# Patient Record
Sex: Female | Born: 1935 | Race: White | Hispanic: No | Smoking: Never smoker
Health system: Southern US, Community
[De-identification: ages and names within clinical notes are randomized; demographics above are authoritative.]

## PROBLEM LIST (undated history)

## (undated) DIAGNOSIS — I10 Essential (primary) hypertension: Secondary | ICD-10-CM

## (undated) DIAGNOSIS — K219 Gastro-esophageal reflux disease without esophagitis: Secondary | ICD-10-CM

## (undated) DIAGNOSIS — E119 Type 2 diabetes mellitus without complications: Secondary | ICD-10-CM

## (undated) HISTORY — PX: TONSILLECTOMY: SUR1361

## (undated) HISTORY — PX: UMBILICAL HERNIA REPAIR: SHX196

## (undated) HISTORY — PX: EYE SURGERY: SHX253

---

## 2009-06-21 LAB — HM COLONOSCOPY: HM Colonoscopy: NORMAL

## 2011-06-28 DIAGNOSIS — E119 Type 2 diabetes mellitus without complications: Secondary | ICD-10-CM | POA: Diagnosis not present

## 2011-06-28 DIAGNOSIS — Z0000 Encounter for general adult medical examination without abnormal findings: Secondary | ICD-10-CM | POA: Diagnosis not present

## 2011-06-28 DIAGNOSIS — I10 Essential (primary) hypertension: Secondary | ICD-10-CM | POA: Diagnosis not present

## 2011-06-28 DIAGNOSIS — Z23 Encounter for immunization: Secondary | ICD-10-CM | POA: Diagnosis not present

## 2011-06-28 DIAGNOSIS — E782 Mixed hyperlipidemia: Secondary | ICD-10-CM | POA: Diagnosis not present

## 2011-06-28 DIAGNOSIS — E559 Vitamin D deficiency, unspecified: Secondary | ICD-10-CM | POA: Diagnosis not present

## 2011-08-04 DIAGNOSIS — L719 Rosacea, unspecified: Secondary | ICD-10-CM | POA: Diagnosis not present

## 2011-08-04 DIAGNOSIS — Z85828 Personal history of other malignant neoplasm of skin: Secondary | ICD-10-CM | POA: Diagnosis not present

## 2011-08-04 DIAGNOSIS — L821 Other seborrheic keratosis: Secondary | ICD-10-CM | POA: Diagnosis not present

## 2011-08-04 DIAGNOSIS — L259 Unspecified contact dermatitis, unspecified cause: Secondary | ICD-10-CM | POA: Diagnosis not present

## 2011-09-01 DIAGNOSIS — D485 Neoplasm of uncertain behavior of skin: Secondary | ICD-10-CM | POA: Diagnosis not present

## 2011-09-01 DIAGNOSIS — L821 Other seborrheic keratosis: Secondary | ICD-10-CM | POA: Diagnosis not present

## 2011-09-01 DIAGNOSIS — Z711 Person with feared health complaint in whom no diagnosis is made: Secondary | ICD-10-CM | POA: Diagnosis not present

## 2011-09-05 DIAGNOSIS — M79609 Pain in unspecified limb: Secondary | ICD-10-CM | POA: Diagnosis not present

## 2012-01-03 DIAGNOSIS — E119 Type 2 diabetes mellitus without complications: Secondary | ICD-10-CM | POA: Diagnosis not present

## 2012-01-03 DIAGNOSIS — I10 Essential (primary) hypertension: Secondary | ICD-10-CM | POA: Diagnosis not present

## 2012-01-03 DIAGNOSIS — E782 Mixed hyperlipidemia: Secondary | ICD-10-CM | POA: Diagnosis not present

## 2012-02-17 DIAGNOSIS — L570 Actinic keratosis: Secondary | ICD-10-CM | POA: Diagnosis not present

## 2012-02-17 DIAGNOSIS — L708 Other acne: Secondary | ICD-10-CM | POA: Diagnosis not present

## 2012-02-17 DIAGNOSIS — L821 Other seborrheic keratosis: Secondary | ICD-10-CM | POA: Diagnosis not present

## 2012-02-17 DIAGNOSIS — L820 Inflamed seborrheic keratosis: Secondary | ICD-10-CM | POA: Diagnosis not present

## 2012-02-17 DIAGNOSIS — L719 Rosacea, unspecified: Secondary | ICD-10-CM | POA: Diagnosis not present

## 2012-02-17 DIAGNOSIS — Z85828 Personal history of other malignant neoplasm of skin: Secondary | ICD-10-CM | POA: Diagnosis not present

## 2012-05-04 DIAGNOSIS — Z23 Encounter for immunization: Secondary | ICD-10-CM | POA: Diagnosis not present

## 2012-06-29 DIAGNOSIS — E119 Type 2 diabetes mellitus without complications: Secondary | ICD-10-CM | POA: Diagnosis not present

## 2012-07-24 DIAGNOSIS — I10 Essential (primary) hypertension: Secondary | ICD-10-CM | POA: Diagnosis not present

## 2012-07-24 DIAGNOSIS — E119 Type 2 diabetes mellitus without complications: Secondary | ICD-10-CM | POA: Diagnosis not present

## 2012-07-24 DIAGNOSIS — E782 Mixed hyperlipidemia: Secondary | ICD-10-CM | POA: Diagnosis not present

## 2012-07-24 DIAGNOSIS — Z1389 Encounter for screening for other disorder: Secondary | ICD-10-CM | POA: Insufficient documentation

## 2012-07-24 DIAGNOSIS — E559 Vitamin D deficiency, unspecified: Secondary | ICD-10-CM | POA: Diagnosis not present

## 2012-07-24 DIAGNOSIS — Z0000 Encounter for general adult medical examination without abnormal findings: Secondary | ICD-10-CM | POA: Diagnosis not present

## 2013-01-22 DIAGNOSIS — I10 Essential (primary) hypertension: Secondary | ICD-10-CM | POA: Diagnosis not present

## 2013-01-22 DIAGNOSIS — E559 Vitamin D deficiency, unspecified: Secondary | ICD-10-CM | POA: Diagnosis not present

## 2013-01-22 DIAGNOSIS — E119 Type 2 diabetes mellitus without complications: Secondary | ICD-10-CM | POA: Diagnosis not present

## 2013-01-22 DIAGNOSIS — E782 Mixed hyperlipidemia: Secondary | ICD-10-CM | POA: Diagnosis not present

## 2013-03-28 DIAGNOSIS — Z23 Encounter for immunization: Secondary | ICD-10-CM | POA: Diagnosis not present

## 2013-06-07 DIAGNOSIS — J988 Other specified respiratory disorders: Secondary | ICD-10-CM | POA: Diagnosis not present

## 2013-06-09 ENCOUNTER — Ambulatory Visit: Payer: Self-pay | Admitting: Physician Assistant

## 2013-06-09 DIAGNOSIS — E7800 Pure hypercholesterolemia, unspecified: Secondary | ICD-10-CM | POA: Diagnosis not present

## 2013-06-09 DIAGNOSIS — E119 Type 2 diabetes mellitus without complications: Secondary | ICD-10-CM | POA: Diagnosis not present

## 2013-06-09 DIAGNOSIS — J40 Bronchitis, not specified as acute or chronic: Secondary | ICD-10-CM | POA: Diagnosis not present

## 2013-06-09 DIAGNOSIS — I10 Essential (primary) hypertension: Secondary | ICD-10-CM | POA: Diagnosis not present

## 2013-06-09 DIAGNOSIS — Z79899 Other long term (current) drug therapy: Secondary | ICD-10-CM | POA: Diagnosis not present

## 2013-06-09 DIAGNOSIS — R509 Fever, unspecified: Secondary | ICD-10-CM | POA: Diagnosis not present

## 2013-06-09 DIAGNOSIS — R0602 Shortness of breath: Secondary | ICD-10-CM | POA: Diagnosis not present

## 2013-07-29 DIAGNOSIS — Z0000 Encounter for general adult medical examination without abnormal findings: Secondary | ICD-10-CM | POA: Diagnosis not present

## 2013-07-29 DIAGNOSIS — E119 Type 2 diabetes mellitus without complications: Secondary | ICD-10-CM | POA: Diagnosis not present

## 2013-07-29 DIAGNOSIS — E782 Mixed hyperlipidemia: Secondary | ICD-10-CM | POA: Diagnosis not present

## 2013-07-29 DIAGNOSIS — I10 Essential (primary) hypertension: Secondary | ICD-10-CM | POA: Diagnosis not present

## 2013-07-29 DIAGNOSIS — E559 Vitamin D deficiency, unspecified: Secondary | ICD-10-CM | POA: Diagnosis not present

## 2013-07-29 DIAGNOSIS — M1710 Unilateral primary osteoarthritis, unspecified knee: Secondary | ICD-10-CM | POA: Diagnosis not present

## 2013-07-29 DIAGNOSIS — Z79899 Other long term (current) drug therapy: Secondary | ICD-10-CM | POA: Diagnosis not present

## 2013-07-29 DIAGNOSIS — Z23 Encounter for immunization: Secondary | ICD-10-CM | POA: Diagnosis not present

## 2013-08-02 ENCOUNTER — Ambulatory Visit: Payer: Self-pay | Admitting: Physician Assistant

## 2013-08-02 DIAGNOSIS — I10 Essential (primary) hypertension: Secondary | ICD-10-CM | POA: Diagnosis not present

## 2013-08-02 DIAGNOSIS — J189 Pneumonia, unspecified organism: Secondary | ICD-10-CM | POA: Diagnosis not present

## 2013-08-02 DIAGNOSIS — Z79899 Other long term (current) drug therapy: Secondary | ICD-10-CM | POA: Diagnosis not present

## 2013-08-02 DIAGNOSIS — E119 Type 2 diabetes mellitus without complications: Secondary | ICD-10-CM | POA: Diagnosis not present

## 2013-08-02 DIAGNOSIS — R05 Cough: Secondary | ICD-10-CM | POA: Diagnosis not present

## 2013-08-02 DIAGNOSIS — E785 Hyperlipidemia, unspecified: Secondary | ICD-10-CM | POA: Diagnosis not present

## 2013-08-02 DIAGNOSIS — R059 Cough, unspecified: Secondary | ICD-10-CM | POA: Diagnosis not present

## 2013-08-02 DIAGNOSIS — R062 Wheezing: Secondary | ICD-10-CM | POA: Diagnosis not present

## 2013-08-02 LAB — RAPID INFLUENZA A&B ANTIGENS

## 2013-10-16 DIAGNOSIS — I10 Essential (primary) hypertension: Secondary | ICD-10-CM | POA: Diagnosis not present

## 2013-10-16 DIAGNOSIS — E559 Vitamin D deficiency, unspecified: Secondary | ICD-10-CM | POA: Diagnosis not present

## 2013-10-16 DIAGNOSIS — E785 Hyperlipidemia, unspecified: Secondary | ICD-10-CM | POA: Diagnosis not present

## 2013-10-16 DIAGNOSIS — E119 Type 2 diabetes mellitus without complications: Secondary | ICD-10-CM | POA: Diagnosis not present

## 2013-10-16 DIAGNOSIS — J309 Allergic rhinitis, unspecified: Secondary | ICD-10-CM | POA: Diagnosis not present

## 2013-12-03 DIAGNOSIS — H2510 Age-related nuclear cataract, unspecified eye: Secondary | ICD-10-CM | POA: Diagnosis not present

## 2013-12-19 DIAGNOSIS — H01009 Unspecified blepharitis unspecified eye, unspecified eyelid: Secondary | ICD-10-CM | POA: Diagnosis not present

## 2013-12-25 ENCOUNTER — Ambulatory Visit: Payer: Self-pay | Admitting: Ophthalmology

## 2013-12-25 DIAGNOSIS — Z01810 Encounter for preprocedural cardiovascular examination: Secondary | ICD-10-CM | POA: Diagnosis not present

## 2013-12-25 DIAGNOSIS — H2510 Age-related nuclear cataract, unspecified eye: Secondary | ICD-10-CM | POA: Diagnosis not present

## 2013-12-25 DIAGNOSIS — I10 Essential (primary) hypertension: Secondary | ICD-10-CM | POA: Diagnosis not present

## 2013-12-27 DIAGNOSIS — E1169 Type 2 diabetes mellitus with other specified complication: Secondary | ICD-10-CM | POA: Insufficient documentation

## 2013-12-27 DIAGNOSIS — E118 Type 2 diabetes mellitus with unspecified complications: Secondary | ICD-10-CM | POA: Insufficient documentation

## 2013-12-27 DIAGNOSIS — E119 Type 2 diabetes mellitus without complications: Secondary | ICD-10-CM | POA: Diagnosis not present

## 2013-12-27 DIAGNOSIS — I10 Essential (primary) hypertension: Secondary | ICD-10-CM | POA: Diagnosis not present

## 2013-12-27 DIAGNOSIS — E785 Hyperlipidemia, unspecified: Secondary | ICD-10-CM | POA: Diagnosis not present

## 2013-12-30 ENCOUNTER — Ambulatory Visit: Payer: Self-pay | Admitting: Ophthalmology

## 2013-12-30 DIAGNOSIS — Z79899 Other long term (current) drug therapy: Secondary | ICD-10-CM | POA: Diagnosis not present

## 2013-12-30 DIAGNOSIS — Z880 Allergy status to penicillin: Secondary | ICD-10-CM | POA: Diagnosis not present

## 2013-12-30 DIAGNOSIS — H25019 Cortical age-related cataract, unspecified eye: Secondary | ICD-10-CM | POA: Diagnosis not present

## 2013-12-30 DIAGNOSIS — E119 Type 2 diabetes mellitus without complications: Secondary | ICD-10-CM | POA: Diagnosis not present

## 2013-12-30 DIAGNOSIS — I10 Essential (primary) hypertension: Secondary | ICD-10-CM | POA: Diagnosis not present

## 2013-12-30 DIAGNOSIS — E7800 Pure hypercholesterolemia, unspecified: Secondary | ICD-10-CM | POA: Diagnosis not present

## 2013-12-30 DIAGNOSIS — H269 Unspecified cataract: Secondary | ICD-10-CM | POA: Diagnosis not present

## 2013-12-30 DIAGNOSIS — H2510 Age-related nuclear cataract, unspecified eye: Secondary | ICD-10-CM | POA: Diagnosis not present

## 2013-12-30 DIAGNOSIS — Z85828 Personal history of other malignant neoplasm of skin: Secondary | ICD-10-CM | POA: Diagnosis not present

## 2014-01-27 DIAGNOSIS — H2510 Age-related nuclear cataract, unspecified eye: Secondary | ICD-10-CM | POA: Diagnosis not present

## 2014-02-03 ENCOUNTER — Ambulatory Visit: Payer: Self-pay | Admitting: Ophthalmology

## 2014-02-03 DIAGNOSIS — Z85828 Personal history of other malignant neoplasm of skin: Secondary | ICD-10-CM | POA: Diagnosis not present

## 2014-02-03 DIAGNOSIS — E7800 Pure hypercholesterolemia, unspecified: Secondary | ICD-10-CM | POA: Diagnosis not present

## 2014-02-03 DIAGNOSIS — I10 Essential (primary) hypertension: Secondary | ICD-10-CM | POA: Diagnosis not present

## 2014-02-03 DIAGNOSIS — H269 Unspecified cataract: Secondary | ICD-10-CM | POA: Diagnosis not present

## 2014-02-03 DIAGNOSIS — E119 Type 2 diabetes mellitus without complications: Secondary | ICD-10-CM | POA: Diagnosis not present

## 2014-02-03 DIAGNOSIS — Z880 Allergy status to penicillin: Secondary | ICD-10-CM | POA: Diagnosis not present

## 2014-02-03 DIAGNOSIS — H2510 Age-related nuclear cataract, unspecified eye: Secondary | ICD-10-CM | POA: Diagnosis not present

## 2014-02-03 DIAGNOSIS — Z79899 Other long term (current) drug therapy: Secondary | ICD-10-CM | POA: Diagnosis not present

## 2014-02-13 DIAGNOSIS — E119 Type 2 diabetes mellitus without complications: Secondary | ICD-10-CM | POA: Diagnosis not present

## 2014-02-13 DIAGNOSIS — E785 Hyperlipidemia, unspecified: Secondary | ICD-10-CM | POA: Diagnosis not present

## 2014-02-13 DIAGNOSIS — E559 Vitamin D deficiency, unspecified: Secondary | ICD-10-CM | POA: Diagnosis not present

## 2014-02-13 DIAGNOSIS — I10 Essential (primary) hypertension: Secondary | ICD-10-CM | POA: Diagnosis not present

## 2014-02-18 LAB — HM MAMMOGRAPHY: HM Mammogram: NORMAL

## 2014-02-25 ENCOUNTER — Ambulatory Visit: Payer: Self-pay | Admitting: Internal Medicine

## 2014-02-25 DIAGNOSIS — Z1231 Encounter for screening mammogram for malignant neoplasm of breast: Secondary | ICD-10-CM | POA: Diagnosis not present

## 2014-04-01 DIAGNOSIS — Z23 Encounter for immunization: Secondary | ICD-10-CM | POA: Diagnosis not present

## 2014-07-21 HISTORY — PX: ESOPHAGOGASTRODUODENOSCOPY: SHX1529

## 2014-07-21 IMAGING — CR DG CHEST 2V
1 series · 2 of 2 positions shown · non-contrast
Comparison: None.

CLINICAL DATA: Fever, shortness of Breath.

EXAM:
CHEST  2 VIEW

[Series 1: pa · 0.17mm/px · 2 of 2 slices shown]
[im 1/2]
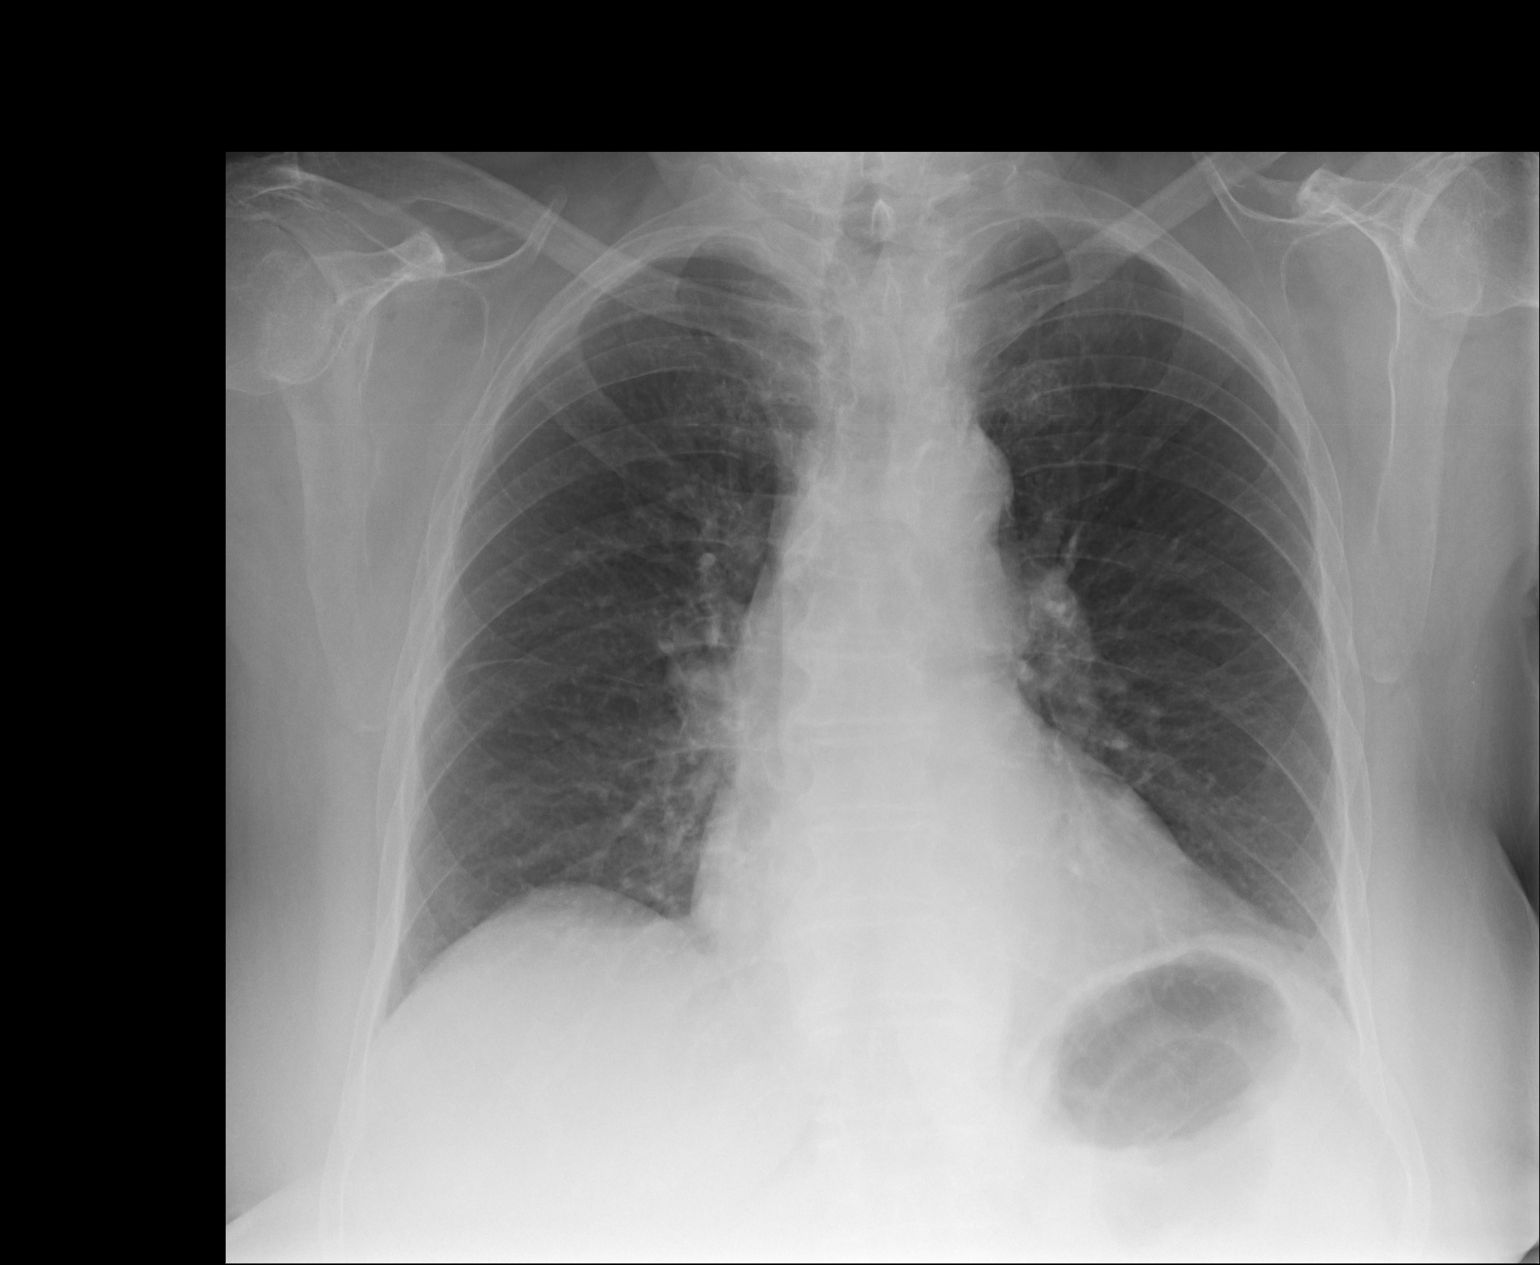
[im 2/2]
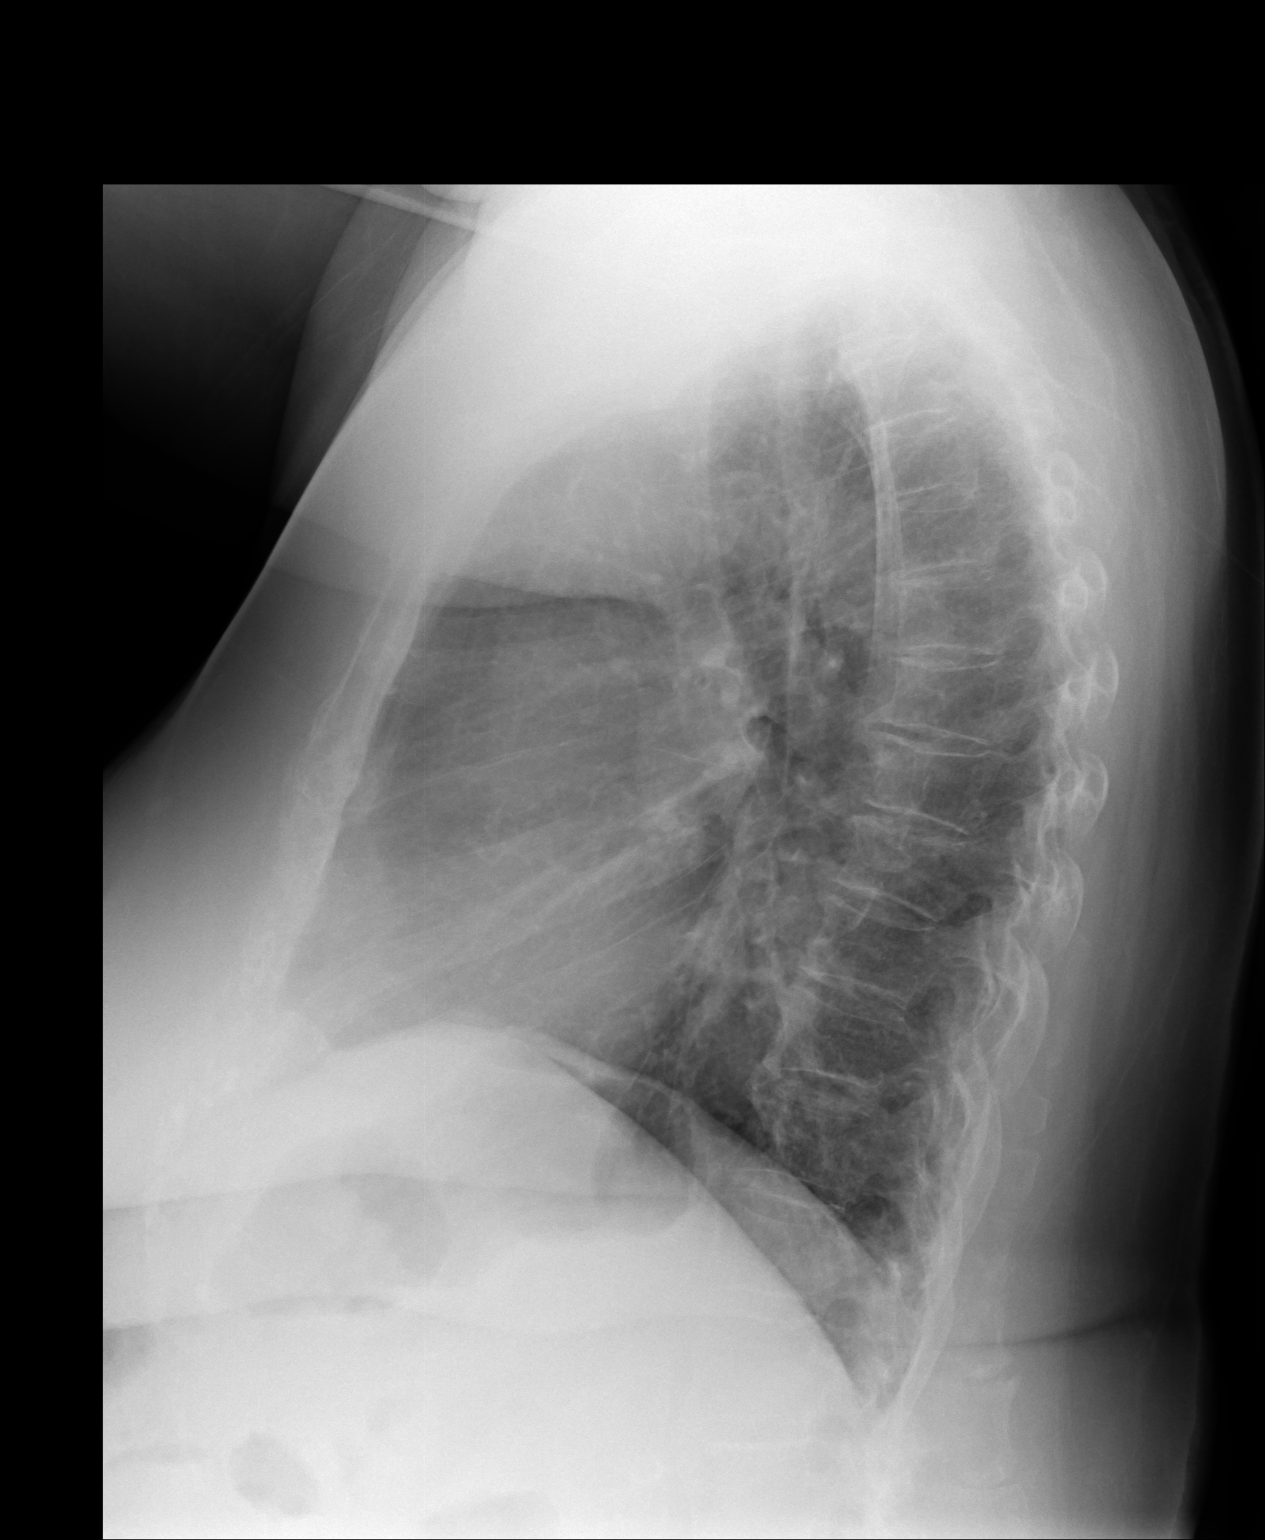

[2 of 2 positions shown; findings below may reference images not displayed]

FINDINGS: The heart size and mediastinal contours are within normal limits.
Both lungs are clear. The visualized skeletal structures are
unremarkable.
IMPRESSION: No active cardiopulmonary disease.

## 2014-08-08 ENCOUNTER — Emergency Department: Payer: Self-pay | Admitting: Emergency Medicine

## 2014-08-08 ENCOUNTER — Ambulatory Visit: Payer: Self-pay

## 2014-08-08 DIAGNOSIS — R1110 Vomiting, unspecified: Secondary | ICD-10-CM | POA: Diagnosis not present

## 2014-08-08 DIAGNOSIS — Z880 Allergy status to penicillin: Secondary | ICD-10-CM | POA: Diagnosis not present

## 2014-08-08 DIAGNOSIS — N2889 Other specified disorders of kidney and ureter: Secondary | ICD-10-CM | POA: Diagnosis not present

## 2014-08-08 DIAGNOSIS — E119 Type 2 diabetes mellitus without complications: Secondary | ICD-10-CM | POA: Diagnosis not present

## 2014-08-08 DIAGNOSIS — R197 Diarrhea, unspecified: Secondary | ICD-10-CM | POA: Diagnosis not present

## 2014-08-08 DIAGNOSIS — E785 Hyperlipidemia, unspecified: Secondary | ICD-10-CM | POA: Diagnosis not present

## 2014-08-08 DIAGNOSIS — R109 Unspecified abdominal pain: Secondary | ICD-10-CM | POA: Diagnosis not present

## 2014-08-08 DIAGNOSIS — K8050 Calculus of bile duct without cholangitis or cholecystitis without obstruction: Secondary | ICD-10-CM | POA: Diagnosis not present

## 2014-08-08 DIAGNOSIS — H6122 Impacted cerumen, left ear: Secondary | ICD-10-CM | POA: Diagnosis not present

## 2014-08-08 DIAGNOSIS — Z7982 Long term (current) use of aspirin: Secondary | ICD-10-CM | POA: Diagnosis not present

## 2014-08-08 DIAGNOSIS — Z79899 Other long term (current) drug therapy: Secondary | ICD-10-CM | POA: Diagnosis not present

## 2014-08-08 DIAGNOSIS — I10 Essential (primary) hypertension: Secondary | ICD-10-CM | POA: Diagnosis not present

## 2014-08-09 DIAGNOSIS — E119 Type 2 diabetes mellitus without complications: Secondary | ICD-10-CM | POA: Diagnosis not present

## 2014-08-09 DIAGNOSIS — K811 Chronic cholecystitis: Secondary | ICD-10-CM | POA: Diagnosis not present

## 2014-08-12 DIAGNOSIS — K921 Melena: Secondary | ICD-10-CM | POA: Diagnosis not present

## 2014-08-12 DIAGNOSIS — R109 Unspecified abdominal pain: Secondary | ICD-10-CM | POA: Diagnosis not present

## 2014-08-13 ENCOUNTER — Inpatient Hospital Stay: Payer: Self-pay | Admitting: Internal Medicine

## 2014-08-13 DIAGNOSIS — I469 Cardiac arrest, cause unspecified: Secondary | ICD-10-CM | POA: Diagnosis not present

## 2014-08-13 DIAGNOSIS — R197 Diarrhea, unspecified: Secondary | ICD-10-CM | POA: Diagnosis not present

## 2014-08-13 DIAGNOSIS — I700 Atherosclerosis of aorta: Secondary | ICD-10-CM | POA: Diagnosis present

## 2014-08-13 DIAGNOSIS — K921 Melena: Secondary | ICD-10-CM | POA: Diagnosis not present

## 2014-08-13 DIAGNOSIS — Z01810 Encounter for preprocedural cardiovascular examination: Secondary | ICD-10-CM | POA: Diagnosis not present

## 2014-08-13 DIAGNOSIS — I452 Bifascicular block: Secondary | ICD-10-CM | POA: Diagnosis not present

## 2014-08-13 DIAGNOSIS — G9340 Encephalopathy, unspecified: Secondary | ICD-10-CM | POA: Diagnosis not present

## 2014-08-13 DIAGNOSIS — E876 Hypokalemia: Secondary | ICD-10-CM | POA: Diagnosis not present

## 2014-08-13 DIAGNOSIS — K922 Gastrointestinal hemorrhage, unspecified: Secondary | ICD-10-CM | POA: Diagnosis not present

## 2014-08-13 DIAGNOSIS — Z8371 Family history of colonic polyps: Secondary | ICD-10-CM | POA: Diagnosis not present

## 2014-08-13 DIAGNOSIS — E860 Dehydration: Secondary | ICD-10-CM | POA: Diagnosis present

## 2014-08-13 DIAGNOSIS — I10 Essential (primary) hypertension: Secondary | ICD-10-CM | POA: Diagnosis present

## 2014-08-13 DIAGNOSIS — I083 Combined rheumatic disorders of mitral, aortic and tricuspid valves: Secondary | ICD-10-CM | POA: Diagnosis present

## 2014-08-13 DIAGNOSIS — E669 Obesity, unspecified: Secondary | ICD-10-CM | POA: Diagnosis present

## 2014-08-13 DIAGNOSIS — Z7982 Long term (current) use of aspirin: Secondary | ICD-10-CM | POA: Diagnosis not present

## 2014-08-13 DIAGNOSIS — K209 Esophagitis, unspecified: Secondary | ICD-10-CM | POA: Diagnosis not present

## 2014-08-13 DIAGNOSIS — R112 Nausea with vomiting, unspecified: Secondary | ICD-10-CM | POA: Diagnosis not present

## 2014-08-13 DIAGNOSIS — R9431 Abnormal electrocardiogram [ECG] [EKG]: Secondary | ICD-10-CM | POA: Diagnosis not present

## 2014-08-13 DIAGNOSIS — E785 Hyperlipidemia, unspecified: Secondary | ICD-10-CM | POA: Diagnosis present

## 2014-08-13 DIAGNOSIS — D649 Anemia, unspecified: Secondary | ICD-10-CM | POA: Diagnosis not present

## 2014-08-13 DIAGNOSIS — I491 Atrial premature depolarization: Secondary | ICD-10-CM | POA: Diagnosis present

## 2014-08-13 DIAGNOSIS — K259 Gastric ulcer, unspecified as acute or chronic, without hemorrhage or perforation: Secondary | ICD-10-CM | POA: Diagnosis not present

## 2014-08-13 DIAGNOSIS — Z86010 Personal history of colonic polyps: Secondary | ICD-10-CM | POA: Diagnosis not present

## 2014-08-13 DIAGNOSIS — K208 Other esophagitis: Secondary | ICD-10-CM | POA: Diagnosis not present

## 2014-08-13 DIAGNOSIS — J9601 Acute respiratory failure with hypoxia: Secondary | ICD-10-CM | POA: Diagnosis not present

## 2014-08-13 DIAGNOSIS — Z8249 Family history of ischemic heart disease and other diseases of the circulatory system: Secondary | ICD-10-CM | POA: Diagnosis not present

## 2014-08-13 DIAGNOSIS — E871 Hypo-osmolality and hyponatremia: Secondary | ICD-10-CM | POA: Diagnosis present

## 2014-08-13 DIAGNOSIS — Z800 Family history of malignant neoplasm of digestive organs: Secondary | ICD-10-CM | POA: Diagnosis not present

## 2014-08-13 DIAGNOSIS — D62 Acute posthemorrhagic anemia: Secondary | ICD-10-CM | POA: Diagnosis present

## 2014-08-13 DIAGNOSIS — K253 Acute gastric ulcer without hemorrhage or perforation: Secondary | ICD-10-CM | POA: Diagnosis present

## 2014-08-13 DIAGNOSIS — E119 Type 2 diabetes mellitus without complications: Secondary | ICD-10-CM | POA: Diagnosis not present

## 2014-08-13 DIAGNOSIS — B3781 Candidal esophagitis: Secondary | ICD-10-CM | POA: Diagnosis not present

## 2014-08-13 DIAGNOSIS — K311 Adult hypertrophic pyloric stenosis: Secondary | ICD-10-CM | POA: Diagnosis not present

## 2014-08-22 DIAGNOSIS — K922 Gastrointestinal hemorrhage, unspecified: Secondary | ICD-10-CM | POA: Diagnosis not present

## 2014-08-22 DIAGNOSIS — I10 Essential (primary) hypertension: Secondary | ICD-10-CM | POA: Diagnosis not present

## 2014-08-22 DIAGNOSIS — E119 Type 2 diabetes mellitus without complications: Secondary | ICD-10-CM | POA: Diagnosis not present

## 2014-08-22 DIAGNOSIS — J40 Bronchitis, not specified as acute or chronic: Secondary | ICD-10-CM | POA: Diagnosis not present

## 2014-08-22 DIAGNOSIS — I5030 Unspecified diastolic (congestive) heart failure: Secondary | ICD-10-CM | POA: Diagnosis not present

## 2014-08-22 DIAGNOSIS — B3781 Candidal esophagitis: Secondary | ICD-10-CM | POA: Diagnosis not present

## 2014-08-22 LAB — CBC AND DIFFERENTIAL
HEMATOCRIT: 28 % — AB (ref 36–46)
HEMOGLOBIN: 8.5 g/dL — AB (ref 12.0–16.0)
PLATELETS: 385 10*3/uL (ref 150–399)

## 2014-08-22 LAB — HEMOGLOBIN A1C: Hgb A1c MFr Bld: 6.9 % — AB (ref 4.0–6.0)

## 2014-08-26 DIAGNOSIS — K253 Acute gastric ulcer without hemorrhage or perforation: Secondary | ICD-10-CM | POA: Diagnosis not present

## 2014-08-26 DIAGNOSIS — K209 Esophagitis, unspecified: Secondary | ICD-10-CM | POA: Diagnosis not present

## 2014-08-26 DIAGNOSIS — K311 Adult hypertrophic pyloric stenosis: Secondary | ICD-10-CM | POA: Diagnosis not present

## 2014-08-29 ENCOUNTER — Ambulatory Visit: Payer: Self-pay | Admitting: Gastroenterology

## 2014-08-29 DIAGNOSIS — K260 Acute duodenal ulcer with hemorrhage: Secondary | ICD-10-CM | POA: Diagnosis not present

## 2014-08-29 DIAGNOSIS — E785 Hyperlipidemia, unspecified: Secondary | ICD-10-CM | POA: Diagnosis not present

## 2014-08-29 DIAGNOSIS — K253 Acute gastric ulcer without hemorrhage or perforation: Secondary | ICD-10-CM | POA: Diagnosis not present

## 2014-08-29 DIAGNOSIS — I10 Essential (primary) hypertension: Secondary | ICD-10-CM | POA: Diagnosis not present

## 2014-08-29 DIAGNOSIS — E559 Vitamin D deficiency, unspecified: Secondary | ICD-10-CM | POA: Diagnosis not present

## 2014-08-29 DIAGNOSIS — K259 Gastric ulcer, unspecified as acute or chronic, without hemorrhage or perforation: Secondary | ICD-10-CM | POA: Diagnosis not present

## 2014-08-29 DIAGNOSIS — K5710 Diverticulosis of small intestine without perforation or abscess without bleeding: Secondary | ICD-10-CM | POA: Diagnosis not present

## 2014-08-29 DIAGNOSIS — K208 Other esophagitis: Secondary | ICD-10-CM | POA: Diagnosis not present

## 2014-08-29 DIAGNOSIS — K209 Esophagitis, unspecified: Secondary | ICD-10-CM | POA: Diagnosis not present

## 2014-08-29 DIAGNOSIS — K311 Adult hypertrophic pyloric stenosis: Secondary | ICD-10-CM | POA: Diagnosis not present

## 2014-08-29 DIAGNOSIS — M1710 Unilateral primary osteoarthritis, unspecified knee: Secondary | ICD-10-CM | POA: Diagnosis not present

## 2014-08-29 DIAGNOSIS — E119 Type 2 diabetes mellitus without complications: Secondary | ICD-10-CM | POA: Diagnosis not present

## 2014-09-01 DIAGNOSIS — J9621 Acute and chronic respiratory failure with hypoxia: Secondary | ICD-10-CM | POA: Diagnosis not present

## 2014-09-01 DIAGNOSIS — Z8679 Personal history of other diseases of the circulatory system: Secondary | ICD-10-CM | POA: Diagnosis not present

## 2014-09-01 DIAGNOSIS — I10 Essential (primary) hypertension: Secondary | ICD-10-CM | POA: Diagnosis not present

## 2014-09-01 DIAGNOSIS — R002 Palpitations: Secondary | ICD-10-CM | POA: Diagnosis not present

## 2014-09-08 DIAGNOSIS — E119 Type 2 diabetes mellitus without complications: Secondary | ICD-10-CM | POA: Diagnosis not present

## 2014-09-08 DIAGNOSIS — I10 Essential (primary) hypertension: Secondary | ICD-10-CM | POA: Diagnosis not present

## 2014-09-08 DIAGNOSIS — B9681 Helicobacter pylori [H. pylori] as the cause of diseases classified elsewhere: Secondary | ICD-10-CM | POA: Diagnosis not present

## 2014-09-08 DIAGNOSIS — K279 Peptic ulcer, site unspecified, unspecified as acute or chronic, without hemorrhage or perforation: Secondary | ICD-10-CM | POA: Diagnosis not present

## 2014-09-08 DIAGNOSIS — Q396 Congenital diverticulum of esophagus: Secondary | ICD-10-CM | POA: Diagnosis not present

## 2014-09-08 DIAGNOSIS — K274 Chronic or unspecified peptic ulcer, site unspecified, with hemorrhage: Secondary | ICD-10-CM | POA: Diagnosis not present

## 2014-09-08 DIAGNOSIS — R251 Tremor, unspecified: Secondary | ICD-10-CM | POA: Diagnosis not present

## 2014-09-10 DIAGNOSIS — R002 Palpitations: Secondary | ICD-10-CM | POA: Diagnosis not present

## 2014-09-18 DIAGNOSIS — R002 Palpitations: Secondary | ICD-10-CM | POA: Diagnosis not present

## 2014-09-18 DIAGNOSIS — E782 Mixed hyperlipidemia: Secondary | ICD-10-CM | POA: Diagnosis not present

## 2014-09-18 DIAGNOSIS — R0789 Other chest pain: Secondary | ICD-10-CM | POA: Diagnosis not present

## 2014-09-18 DIAGNOSIS — I10 Essential (primary) hypertension: Secondary | ICD-10-CM | POA: Diagnosis not present

## 2014-09-24 DIAGNOSIS — D6489 Other specified anemias: Secondary | ICD-10-CM | POA: Diagnosis not present

## 2014-09-26 DIAGNOSIS — D509 Iron deficiency anemia, unspecified: Secondary | ICD-10-CM | POA: Diagnosis not present

## 2014-09-26 DIAGNOSIS — R0789 Other chest pain: Secondary | ICD-10-CM | POA: Diagnosis not present

## 2014-10-03 DIAGNOSIS — J9621 Acute and chronic respiratory failure with hypoxia: Secondary | ICD-10-CM | POA: Diagnosis not present

## 2014-10-08 DIAGNOSIS — E11329 Type 2 diabetes mellitus with mild nonproliferative diabetic retinopathy without macular edema: Secondary | ICD-10-CM | POA: Diagnosis not present

## 2014-10-11 NOTE — Op Note (Signed)
PATIENT NAME:  Penny Andrews, Penny Andrews MR#:  [id number] DATE OF BIRTH:  February 21, 1925  DATE OF PROCEDURE:  12/30/2013  PREOPERATIVE DIAGNOSIS: Cataract, right eye.   POSTOPERATIVE DIAGNOSIS: Cataract, right eye.   PROCEDURE PERFORMED: Extracapsular cataract extraction using phacoemulsification with placement Alcon [id number], 24.5-diopter posterior chamber lens, serial number [id number].    SURGEON:  Loura Back. Makaylin Carlo, MD  ASSISTANT:  None.  ANESTHESIA: 4% lidocaine and 0.75% Marcaine a 50-50 mixture with 10 units/mL of HyoMax added, given as a peribulbar.   ANESTHESIOLOGIST: Dr. Verl Dicker COMPLICATIONS: None.   ESTIMATED BLOOD LOSS: Less than 1 mL.   DESCRIPTION OF PROCEDURE:  The patient was brought to the operating room and given a peribulbar block.  The patient was then prepped and draped in the usual fashion.  The vertical rectus muscles were imbricated using 5-0 silk sutures.  These sutures were then clamped to the sterile drapes as bridle sutures.  A limbal peritomy was performed extending two clock hours and hemostasis was obtained with cautery.  A partial thickness scleral groove was made at the surgical limbus and dissected anteriorly in a lamellar dissection using an Alcon crescent knife.  The anterior chamber was entered superonasally with a Superblade and through the lamellar dissection with a 2.6 mm keratome.  DisCoVisc was used to replace the aqueous and a continuous tear capsulorrhexis was carried out.  Hydrodissection and hydrodelineation were carried out with balanced salt and a 27 gauge canula.  The nucleus was rotated to confirm the effectiveness of the hydrodissection.  Phacoemulsification was carried out using a divide-and-conquer technique.  Total ultrasound time was 1 minute and 34.4 seconds with an average power of 19.4 percent. CDE of 33.24. No suture was placed.   Irrigation/aspiration was used to remove the residual cortex.  DisCoVisc was used to inflate the capsule and the  internal incision was enlarged to 3 mm with the crescent knife.  The intraocular lens was folded and inserted into the capsular bag using the Alcon AcrySert delivery system. Irrigation/aspiration was used to remove the residual DisCoVisc.  Miostat was injected into the anterior chamber through the paracentesis track to inflate the anterior chamber and induce miosis. A tenth of a milliliter of cefuroxime was injected via the paracentesis track containing 1 mg of drug. The wound was checked for leaks and none were found. The conjunctiva was closed with cautery and the bridle sutures were removed.  Two drops of 0.3% Vigamox were placed on the eye.   An eye shield was placed on the eye.  The patient was discharged to the recovery room in good condition.   ____________________________ Loura Back Jyaire Koudelka, MD sad:lt D: 12/30/2013 12:47:00 ET T: 12/31/2013 00:13:12 ET JOB#: [id number]  cc: Remo Lipps A. Josefine Fuhr, MD, <Dictator> Martie Lee MD ELECTRONICALLY SIGNED 01/06/2014 12:32

## 2014-10-11 NOTE — Op Note (Signed)
PATIENT NAME:  Penny Andrews, Penny Andrews MR#:  [id number] DATE OF BIRTH:  13-Oct-1924  DATE OF PROCEDURE:  02/03/2014  PREOPERATIVE DIAGNOSIS:  Cataract, left eye.    POSTOPERATIVE DIAGNOSIS:  Cataract, left eye.  PROCEDURE PERFORMED:  Extracapsular cataract extraction using phacoemulsification with placement of an Alcon SN6CWS 24.5-diopter posterior chamber lens, serial #[id number].  SURGEON:  Loura Back. Akeyla Molden, MD  ASSISTANT:  None.  ANESTHESIA:  4% lidocaine and 0.75% Marcaine in a 50/50 mixture with 10 units/mL of Hylenex added, given as a peribulbar.   ANESTHESIOLOGIST: Dr. Marcello Moores.  COMPLICATIONS:  None.  ESTIMATED BLOOD LOSS:  Less than 1 ml.  DESCRIPTION OF PROCEDURE:  The patient was brought to the operating room and given a peribulbar block.  The patient was then prepped and draped in the usual fashion.  The vertical rectus muscles were imbricated using 5-0 silk sutures.  These sutures were then clamped to the sterile drapes as bridle sutures.  A limbal peritomy was performed extending two clock hours and hemostasis was obtained with cautery.  A partial thickness scleral groove was made at the surgical limbus and dissected anteriorly in a lamellar dissection using an Alcon crescent knife.  The anterior chamber was entered supero-temporally with a Superblade and through the lamellar dissection with a 2.6 mm keratome.  DisCoVisc was used to replace the aqueous and a continuous tear capsulorrhexis was carried out.  Hydrodissection and hydrodelineation were carried out with balanced salt and a 27 gauge canula.  The nucleus was rotated to confirm the effectiveness of the hydrodissection.  Phacoemulsification was carried out using a divide-and-conquer technique.  Total ultrasound time was 1 minute with an average power of 24.1 percent and CDE of 24.50.  Irrigation/aspiration was used to remove the residual cortex.  DisCoVisc was used to inflate the capsule and the internal incision was enlarged  to 3 mm with the crescent knife.  The intraocular lens was folded and inserted into the capsular bag using the AcrySert delivery system.  Irrigation/aspiration was used to remove the residual DisCoVisc.  Miostat was injected into the anterior chamber through the paracentesis track to inflate the anterior chamber and induce miosis. A tenth of a milliliter of Vigamox was injected via the paracentesis tract containing 1 mg of drug. The wound was checked for leaks and none were found. The conjunctiva was closed with cautery and the bridle sutures were removed.  Two drops of 0.3% Vigamox were placed on the eye.   An eye shield was placed on the eye.  The patient was discharged to the recovery room in good condition.  ____________________________ Loura Back Cecille Mcclusky, MD sad:sb D: 02/03/2014 13:19:35 ET T: 02/03/2014 13:48:44 ET JOB#: [id number]  cc: Remo Lipps A. Angelena Sand, MD, <Dictator> Martie Lee MD ELECTRONICALLY SIGNED 02/10/2014 14:02

## 2014-10-16 DIAGNOSIS — I10 Essential (primary) hypertension: Secondary | ICD-10-CM | POA: Diagnosis not present

## 2014-10-16 DIAGNOSIS — E782 Mixed hyperlipidemia: Secondary | ICD-10-CM | POA: Diagnosis not present

## 2014-10-16 DIAGNOSIS — K209 Esophagitis, unspecified: Secondary | ICD-10-CM | POA: Diagnosis not present

## 2014-10-19 NOTE — Consult Note (Signed)
Chief Complaint:  Subjective/Chief Complaint seen for melena.  some further drift downward of hgb, however no emesis, no abd pain, no melena,. no bm.   VITAL SIGNS/ANCILLARY NOTES: **Vital Signs.:   25-Feb-16 09:44  Vital Signs Type Q 4hr  Temperature Temperature (F) 98.2  Temperature Source oral  Pulse Pulse 95  Respirations Respirations 18  Systolic BP Systolic BP 009  Diastolic BP (mmHg) Diastolic BP (mmHg) 71  Mean BP 91  Pulse Ox % Pulse Ox % 97  Pulse Ox Activity Level  At rest  Oxygen Delivery Room Air/ 21 %    11:58  Vital Signs Type Recheck  Systolic BP Systolic BP 381  Diastolic BP (mmHg) Diastolic BP (mmHg) 68  Mean BP 83   Brief Assessment:  Cardiac Irregular   Respiratory clear BS   Gastrointestinal details normal Soft  Nontender  Nondistended  No masses palpable  Bowel sounds normal   Lab Results: Routine UA:  25-Feb-16 22:26   Color (UA) Yellow  Clarity (UA) Cloudy  Glucose (UA) Negative  Bilirubin (UA) Negative  Ketones (UA) Negative  Specific Gravity (UA) 1.016  Blood (UA) 1+  pH (UA) 5.0  Protein (UA) Negative  Nitrite (UA) Negative  Leukocyte Esterase (UA) 3+ (Result(s) reported on 14 Aug 2014 at 12:02PM.)  RBC (UA) 4 /HPF  WBC (UA) 29 /HPF  Bacteria (UA) 1+  Epithelial Cells (UA) 17 /HPF  Transitional Epithelial (UA) 5 /HPF  Mucous (UA) PRESENT  Hyaline Cast (UA) 4 /LPF (Result(s) reported on 14 Aug 2014 at 12:02PM.)  Routine Hem:  24-Feb-16 12:17   Hemoglobin (CBC)  9.4    19:20   Hemoglobin (CBC)  8.7 (Result(s) reported on 13 Aug 2014 at 07:33PM.)  25-Feb-16 04:55   WBC (CBC) 7.2  RBC (CBC)  3.19  Hemoglobin (CBC)  7.7  Hematocrit (CBC)  24.8  Platelet Count (CBC) 208  MCV  78  MCH  24.2  MCHC  31.1  RDW  15.1  Neutrophil % 73.9  Lymphocyte % 11.9  Monocyte % 11.3  Eosinophil % 2.6  Basophil % 0.3  Neutrophil # 5.3  Lymphocyte #  0.9  Monocyte # 0.8  Eosinophil # 0.2  Basophil # 0.0 (Result(s) reported on 14 Aug 2014  at 06:25AM.)   Assessment/Plan:  Assessment/Plan:  Assessment 1) melena, anemia.  no repeat overnight.  some drift of hgb overnight without evidence of ongoing bleeding.   2) diarrheal illness resolved.   Plan 1) egd today.  I have discussed the risks benefits nd complications of egd to include not limited to bleeding infection perforation and sedation and she wishes to proceed.  Will recheck hgb and bun.   Electronic Signatures: Loistine Simas (MD)  (Signed 25-Feb-16 13:36)  Authored: Chief Complaint, VITAL SIGNS/ANCILLARY NOTES, Brief Assessment, Lab Results, Assessment/Plan   Last Updated: 25-Feb-16 13:36 by Loistine Simas (MD)

## 2014-10-19 NOTE — Consult Note (Signed)
PATIENT NAME:  Penny Andrews, JUDY MR#:  [id number] DATE OF BIRTH:  1925-03-21  DATE OF CONSULTATION:  08/14/2014   REFERRING PHYSICIAN:   CONSULTING PHYSICIAN:  Dionisio David, MD  HISTORY OF PRESENT ILLNESS: This is an 81 year old white female with a past medical history of diabetes mellitus who presented to the Emergency Room with anemia and black-colored stools. She has been having some diarrhea and nausea and vomiting. She denies any chest pain, shortness of breath, orthopnea, PND, syncope or any dizziness. Apparently, her EKG was abnormal. Thus, I was asked to evaluate the patient prior to EGD and colonoscopy.   PAST MEDICAL HISTORY: History of having an abnormal EKG. She was evaluated in August by a cardiologist for eye surgery. Apparently, everything went fine. Her past medical history other than that is diabetes mellitus. No history of hypertension or hyperlipidemia.   SOCIAL HISTORY: She denies EtOH abuse or smoking.   FAMILY HISTORY: Positive for coronary artery disease.   PHYSICAL EXAMINATION:  GENERAL: She is alert and oriented x3, in no acute distress.  VITAL SIGNS: Stable. Her blood pressure is 104/55, respirations 18, temperature 98.6, pulse 89, saturation 94.  NECK: Revealed no JVD.  LUNGS: Clear.  HEART: Regular rate and rhythm. Normal S1, S2. No audible murmur.  ABDOMEN: Soft, nontender. Positive bowel sounds.  EXTREMITIES: No pedal edema.  NEUROLOGIC: She appears to be intact.   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   LABORATORY DATA: White count is 7.2, hemoglobin 7.7, hematocrit 24.8. Troponins were negative.   ASSESSMENT AND PLAN: Asymptomatic patient with bifascicular block, sinus rhythm, occasional premature ventricular contractions and atrial premature complexes. The patient is a low risk for EGD and colonoscopy. Advised proceeding with the procedure.    Thank you very much for the referral.    ____________________________ Dionisio David, MD sak:JT D: 08/14/2014 08:26:35 ET T: 08/14/2014 08:36:43 ET JOB#: [id number]  cc: Dionisio David, MD, <Dictator> Dionisio David MD ELECTRONICALLY SIGNED 08/21/2014 12:18

## 2014-10-19 NOTE — Consult Note (Signed)
Chief Complaint:  Subjective/Chief Complaint Please see full GI consult and brief consult note.  81 yo f admitted with anemia and heme positive stools with several days of intermittant black stools.  Diarrheal illness started  about a week ago, slow drift of hgb since then, no nsaids except for 81 asa daily, takes no ppi at home no h/o PUDz, no antecedant symptoms.  FHx pertinant for colon ca in mother.  Currently on iv bid ppi.  Last emesis yesterday, last bm this am.  Will plan for EGD tomorrow.  Clears, no red or carbonation.    Possible colonoscopy ptd depending on result.  Will ask for cardiology consult re abnormal ECG for clearance before the procedure.  Serial hgb, transfuse as needed.   VITAL SIGNS/ANCILLARY NOTES: **Vital Signs.:   24-Feb-16 15:24  Vital Signs Type Admission  Temperature Temperature (F) 97.8  Celsius 36.5  Temperature Source oral  Pulse Pulse 75  Respirations Respirations 18  Systolic BP Systolic BP 324  Diastolic BP (mmHg) Diastolic BP (mmHg) 67  Mean BP 79  Pulse Ox % Pulse Ox % 100  Pulse Ox Activity Level  At rest  Oxygen Delivery Room Air/ 21 %   Brief Assessment:  Cardiac Irregular   Respiratory clear BS   Gastrointestinal details normal Soft  Nontender  Nondistended  No masses palpable  Bowel sounds normal   Lab Results:  Routine Hem:  24-Feb-16 12:17   Hemoglobin (CBC)  9.4  Platelet Count (CBC) 259   Assessment/Plan:  Assessment/Plan:  Assessment as above.  I have diswcussed the risks benefits and complicatiosn of egd to incluse not limited to bleding infection perforations and sedation and she wishes to proceed.   Electronic Signatures: Loistine Simas (MD)  (Signed [phone] 18:24)  Authored: Chief Complaint, VITAL SIGNS/ANCILLARY NOTES, Brief Assessment, Lab Results, Assessment/Plan   Last Updated: 24-Feb-16 18:24 by Loistine Simas (MD)

## 2014-10-19 NOTE — Consult Note (Signed)
PATIENT NAME:  Penny Andrews, Penny Andrews MR#:  [id number] DATE OF BIRTH:  1924-11-24  DATE OF CONSULTATION:  08/13/2014  REFERRING PHYSICIAN:  Srikar R. Sudini, MD CONSULTING PHYSICIAN:  Lollie Sails, MD, Janalyn Harder Jerelene Redden, ANP  PRIMARY CARE PHYSICIAN:  Halina Maidens, MD  REASON FOR CONSULTATION: Melena with anemia.   HISTORY OF PRESENT ILLNESS: This 81 year old patient with history of hypertension, diabetes mellitus, hyperlipidemia, and previous colonoscopy about 9 years ago for history of colon polyps, was in her usual state of health last week. She reports she went to bed Tuesday feeling fine, but woke up at 3:00 in the morning with acute episode of nausea, vomiting, and diarrhea. The emesis was x 1 and that was food she had eaten earlier. She had noted no blood or coffee grounds. She did have 3-4 episodes of diarrhea. There was mild upper abdominal discomfort with the retching. She had slight lower abdominal discomfort. The following next 2 days, she did not feel well, continued to have episodes of random vomiting, but mostly diarrhea 3-4 times with nocturnal awakening. She did notice black stools. She proceeded to the urgent care in [facility] and was found to be Hemoccult positive. She did go to the Emergency Room Friday afternoon and had laboratory work done with a gallbladder ultrasound. Her WBC was 11.0, hemoglobin 11.4, BUN was 33. Dr. Burt Knack saw her and did not recommend gallbladder surgery. He did recommend GI evaluation. He did recommend upper endoscopy evaluation and she was sent home from the ER to schedule this as an outpatient.   The patient vomited once a day over the weekend and then twice yesterday. She did have diarrhea about 4 times, and up twice at night. She presented back to her doctor's office to Dr Gaspar Cola office today and her hemoglobin was noted to have decrease from 11.4 down to 9.4.  She had microcytic indices. She was subsequently sent back to the hospital and admitted.   She  does report a colonoscopy done by Dr. Tobias Alexander about 9 years ago. She has had colon polyps in the past. She thinks she was on a 10 year colonoscopy surveillance. She does disclose that her 52 year old mother presented with similar symptoms and rectal bleeding and was found to have colon cancer at the age of 44. This patient denies nonsteroidal anti-inflammatory drug use. She is on a baby aspirin. She reports she has had no problems with her stomach or bowels at all until this acute event Tuesday. She recalls cooking a chicken Monday and leaving it out on the counter all night Monday night. She got up Tuesday and refrigerated it and had it for dinner about 5:30 Tuesday night. It was about 7 hours or so later that she woke up with GI complaints.   PAST MEDICAL HISTORY: 1.  Hypertension.  2.  Diabetes mellitus.  3.  Hyperlipidemia.   PAST SURGICAL HISTORY: 1.  Tonsillectomy.  2   Adenoidectomy.  3.  Umbilical hernia repair.   4.  Bilateral cataract surgery.   FAMILY HISTORY: Mother with colon cancer at age 16. Daughter with colon polyps.   SOCIAL HISTORY: Negative tobacco, alcohol, or illicit drug use.  Patient has been living alone independently for the last 2 years. Daughter is present with her at the bedside.   MEDICATIONS:  1.  Glipizide 1 tablet twice daily.  2.  Metformin 750 mg 1 tablet twice daily.  3.  Lisinopril 5 mg once daily.  4.  Lovastatin 40 mg daily.   ALLERGIES:  PENICILLIN, YIELDS RASH.   REVIEW OF SYSTEMS: The patient was found to have an irregular heartbeat in the Emergency Room. She is unaware of any cardiac arrhythmias. The EKG did show right bundle branch block and left anterior fascicular block. She has a few PVCs. The patient denies chest pain, pressure, heaviness, tightness, shortness of breath. She does have a dry mouth, dry throat with a little cough. She has had no upper respiratory infections. Remaining 10 systems otherwise negative.   LABORATORY DATA: ER  laboratory from 08/08/2014 with BUN 33, creatinine 1.02. Albumin 3.5, total protein 7.4. Liver panel normal. Troponin less than 0.02. WBC 11.5, hemoglobin 11.4, platelets 222,000, MCV 78, MCH 24.2. Urinalysis was cloudy, yellow, trace esterase, WBC 3 per high-power field, trace bacteria, mucus present, hyaline casts present.   Repeat laboratory studies, dated 08/13/2014, with BUN 39, creatinine 1.10. Sodium was 133, potassium 3.4, lipase 199, albumin 2.7, total bilirubin 0.4, alkaline phosphatase 71, ALT 11, total protein 6.6. WBC 10.6, hemoglobin 9.4, hematocrit 30.3.   RADIOLOGY: Abdominal ultrasound performed 08/08/2014 as an outpatient in the Emergency Room was limited to the gallbladder only. There is thickening of the gallbladder wall up to 5.4 mm. No ultrasonic Murphy sign. There are echogenic foci within the gallbladder, which may represent gallbladder sludge or cholesterol crystals. There is shadowing calcification of the gallbladder fundal wall, adherent gallstone in the region cannot be excluded, small pericholecystic fluid. Common bile duct is 6.3. Recommended to exclude cholecystitis.   PHYSICAL EXAMINATION: VITAL SIGNS: Are 97.8, 75, 103/67, pulse oximetry on room air is 100%.  GENERAL: Elderly, well-nourished, spry Caucasian female resting in bed in no distress.  HEENT: Shows head is normocephalic. Conjunctivae are pale pink. Sclerae anicteric. Oral mucosa is dry and intact.  NECK: Supple. Trachea midline.  CARDIAC: S1 and S2 with overall regular rhythm with skipped heartbeat noted. No murmur appreciated.  LUNGS: CTA. Respirations are eupneic.  ABDOMEN: Soft, rounded, which is her baseline, nondistended and nontender in the entire abdomen. She reports she feels just a little soreness in the lower abdomen, but this is not reproducible with palpation. No right upper quadrant tenderness and liver edge is not palpable.  RECTAL: Exam was deferred.  SKIN: Warm and dry without rash or edema.   EXTREMITIES: Lower extremities without edema, cyanosis, or clubbing.  MUSCULOSKELETAL: No joint swelling or inflammation noted.  NEUROLOGIC: Cranial nerves II-XII grossly intact. No focal deficits. The patient turns herself easily in bed.  PSYCHIATRIC:  She is alert and oriented, pleasant affect and mood. She is a really good historian.   IMPRESSION:  1.  The patient presents with acute episode of nausea, vomiting, and diarrhea that woke her in the middle of the night after eating supper of cooked chicken that had been left out all night on a countertop. The possibility of her getting a salmonella food poisoning is considered. There is also possibility of a staphylococcus infection as these are likely bacterial infections related to food. She did have gallbladder abnormality as well, but she has been seen by Dr. Burt Knack on 08/09/2014 who noted no abdominal tenderness, and recommended evaluation for guaiac-positive stool and anemia before considering cholecystectomy.  2.  Guaiac-positive stool, melena with this acute diarrhea episode. There was no hematemesis or coffee-ground emesis noted. The patient has had a slow drift in her hemoglobin with microcytic anemia indices. This could be a slow iron deficiency. This indicates she may have had chronic iron deficiency. Etiology for the acute drop in hemoglobin certainly to  be considered, upper GI bleed with erosive gastritis, gastric ulcer, duodenal ulcer. Doubt Mallory-Weiss tear as she was not vomiting visible blood.  3.  Minimal abdominal discomfort and no tenderness on exam.  4.  Personal history of possible colon polyps, family history of colon cancer in mother, and patient and daughter tell me that her last colonoscopy was 9-10 years ago. She says she aged out of the colonoscopy process, but she has had several in the past. She thinks she has had colon polyps.   PLAN: 1.  Recommend IV Protonix to continue.  2.  Would give her clear liquids without any  red beverage or carbonation.  3.  Cardiac clearance for upper endoscopy tomorrow afternoon. This is because her EKG showed right bundle branch block with left anterior fascicular block and she will require IV sedation. In addition, if the upper endoscopy is unrevealing tomorrow, then she will likely need to have a colonoscopy the following day. Would like to have cardiac clearance before proceeding per Dr. Marton Redwood recommendation.  4.  Stool studies for bacterial infection have been ordered and we have extended that to include C. difficile, given her advanced age as well as O and P.  5.  Abnormal gallbladder is noted, without pain, tenderness. would agree with luminal evaluation.   This case was discussed with Dr. Gustavo Lah in collaboration of care. Thank you for this consultation.   These services provided by Denice Paradise, ANP, under collaborative agreement with Lollie Sails, MD.   ____________________________ Janalyn Harder Jerelene Redden, ANP (Adult Nurse Practitioner) kam:LT D: 08/13/2014 17:36:22 ET T: 08/13/2014 18:49:16 ET JOB#: [id number]  cc: Joelene Millin A. Jerelene Redden, ANP (Adult Nurse Practitioner), <Dictator> Janalyn Harder Sherlyn Hay, MSN, ANP-BC Adult Nurse Practitioner ELECTRONICALLY SIGNED 08/15/2014 15:26

## 2014-10-19 NOTE — Consult Note (Signed)
Chief Complaint:  Subjective/Chief Complaint seen for upper GI bleeding, n/v,diarrhea.   no n/v diarrhea resolving.  no further chest pain or discomfort.   VITAL SIGNS/ANCILLARY NOTES: **Vital Signs.:   26-Feb-16 16:01  Vital Signs Type Q 4hr  Temperature Temperature (F) 98.5  Temperature Source oral  Pulse Pulse 82  Respirations Respirations 18  Systolic BP Systolic BP 976  Diastolic BP (mmHg) Diastolic BP (mmHg) 76  Mean BP 92  Pulse Ox % Pulse Ox % 99  Pulse Ox Activity Level  At rest  Oxygen Delivery Room Air/ 21 %  *Intake and Output.:   26-Feb-16 11:26  Stool  pt had very large broccoli green loose stool   Brief Assessment:  Cardiac Regular   Respiratory clear BS   Gastrointestinal details normal Soft  Nontender  Nondistended  No masses palpable  Bowel sounds normal  protuberant   Lab Results: Routine UA:  25-Feb-16 22:26   Color (UA) Yellow  Clarity (UA) Cloudy  Glucose (UA) Negative  Bilirubin (UA) Negative  Ketones (UA) Negative  Specific Gravity (UA) 1.016  Blood (UA) 1+  pH (UA) 5.0  Protein (UA) Negative  Nitrite (UA) Negative  Leukocyte Esterase (UA) 3+ (Result(s) reported on 14 Aug 2014 at 12:02PM.)  RBC (UA) 4 /HPF  WBC (UA) 29 /HPF  Bacteria (UA) 1+  Epithelial Cells (UA) 17 /HPF  Transitional Epithelial (UA) 5 /HPF  Mucous (UA) PRESENT  Hyaline Cast (UA) 4 /LPF (Result(s) reported on 14 Aug 2014 at 12:02PM.)  Routine Hem:  26-Feb-16 07:09   WBC (CBC) 5.1  RBC (CBC)  3.25  Hemoglobin (CBC)  7.8  Hematocrit (CBC)  25.4  Platelet Count (CBC) 243  MCV  78  MCH  24.0  MCHC  30.6  RDW  15.2  Neutrophil % 70.5  Lymphocyte % 12.7  Monocyte % 12.0  Eosinophil % 4.1  Basophil % 0.7  Neutrophil # 3.6  Lymphocyte #  0.6  Monocyte # 0.6  Eosinophil # 0.2  Basophil # 0.0 (Result(s) reported on 15 Aug 2014 at 07:58AM.)   Radiology Results: Cardiology:    25-Feb-16 19:28, Echo Doppler  Echo Doppler   REASON FOR EXAM:      COMMENTS:        PROCEDURE: [facility] - ECHO DOPPLER COMPLETE(TRANSTHOR)  - Aug 14 2014  7:28PM     RESULT: Echocardiogram Report    Patient Name:   Penny Andrews Date of Exam: 08/14/2014  Medical Rec #:  [id number]       Custom1:  Date of Birth:  20-Feb-1925   Height:       62.0 in  Patient Age:    81 years     Weight:       181.0 lb  Patient Gender: F            BSA:          1.83 m??    Indications: SOB  Sonographer:    Arville Go RDCS  Referring Phys: Hillary Bow, R    Summary:   1. Left ventricular ejection fraction, by visual estimation, is 60 to   65%.   2. Elevated mean left atrial pressure.   3. Impaired relaxation pattern of LV diastolic filling.   4. Severely dilated left atrium.   5. Moderately dilated right atrium.   6. Mild mitral valve regurgitation.   7. Mild to moderate aortic valve sclerosis/calcification without any   evidence of aortic stenosis.   8. Mildly elevated pulmonary artery  systolic pressure.   9. Mild to moderate tricuspid regurgitation.  10. Intact interatrial and interventricular septa appear intact, with no     echocardiographic evidence of intracardiac shunting.  2D AND M-MODE MEASUREMENTS (normal ranges within parentheses):  Left Ventricle:          Normal  IVSd (2D):      1.14 cm (0.7-1.1)  LVPWd (2D):     1.08 cm (0.7-1.1) Aorta/LA:                  Normal  LVIDd (2D):     4.13 cm (3.4-5.7) Aortic Root (2D): 3.00 cm (2.4-3.7)  LVIDs (2D):     2.78 cm           Left Atrium (2D): 3.00 cm (1.9-4.0)  LV FS (2D):     32.7 %   (>25%)  LV EF (2D):     61.6 %   (>50%)                                    Right Ventricle:                                    RVd (2D):  SPECTRAL DOPPLER ANALYSIS (where applicable):  Aortic Valve: AoV Max Vel: 1.77 m/s AoV Peak PG: 12.5 mmHg AoV Mean PG:  LVOT Vmax: 1.41 m/s LVOT VTI:  LVOT Diameter: 1.90 cm  AoV Area, Vmax: 2.26 cm?? AoV Area, VTI:  AoV Area, Vmn:  Tricuspid Valve and PA/RV Systolic Pressure: TR Max Velocity: 3.14  m/s RA   Pressure: 10 mmHg RVSP/PASP: 49.4 mmHg  Pulmonic Valve:  PV Max Velocity: 1.24 m/s PV Max PG: 6.2 mmHg PV Mean PG:    PHYSICIAN INTERPRETATION:  Left Ventricle: The left ventricular internal cavity size was normal. LV   posterior wall thickness was normal. Left ventricular ejection fraction,   by visual estimation, is 60 to 65%. Spectral Doppler shows impaired   relaxation pattern of LV diastolic filling. Elevated mean left atrial   pressure.  Right Ventricle: The right ventricular size is mildly enlarged.  Left Atrium: The left atrium is severely dilated.  Right Atrium: The right atrium is moderately dilated.  Mitral Valve: Mild mitral valve regurgitation is seen.  Tricuspid Valve: Mild to moderate tricuspid regurgitation is visualized.   The tricuspid regurgitant velocity is 3.14 m/s, and with an assumedright   atrial pressure of 10 mmHg, the estimated right ventricular systolic   pressure is mildly elevated at 49.4 mmHg.  Aortic Valve: Mild to moderate aortic valve sclerosis/calcification is   present, without any evidence of aortic stenosis. Trivial aortic valve   regurgitation is seen.  Pulmonic Valve: Trace pulmonic valve regurgitation.  Shunts: Interatrial and interventricular septa appear intact, with no   evidence of intracardiac shunting by spectral or color Doppler.    H. Rivera Colon MD  Electronically signed by [id number] Neoma Laming MD  Signature Date/Time: 08/15/2014/8:25:43 AM  *** Final ***    IMPRESSION: .        Verified By: Emmit Pomfret. Humphrey Rolls, M.D., MD   Assessment/Plan:  Assessment/Plan:  Assessment 1) n/v retrosternal discomfort-EGD showing severe/ grade d erosive esophagitis and a severe prepyloric/pyloric ulcer with partial gastric outlet obstruction, unable to go into duodenum.   Plan 1) tolerating clears, will advance to full liquids.  Continue iv  ppi through tomorrow, then change to bid po protonix.  Continue full liquids on d/c.  will need ov  nest week, to arrange repeat scope in about 10 days as o/p.  Continue full liquids until then.  Awaiting h. pylori serology. Dc likely ok monday if tolerating adequate full liquids.   Dr Allen Norris covering over the weekend.   Electronic Signatures: Loistine Simas (MD)  (Signed [phone] 17:00)  Authored: Chief Complaint, VITAL SIGNS/ANCILLARY NOTES, Brief Assessment, Lab Results, Radiology Results, Assessment/Plan   Last Updated: 26-Feb-16 17:00 by Loistine Simas (MD)

## 2014-10-19 NOTE — H&P (Signed)
PATIENT NAME:  Penny Andrews, Penny Andrews MR#:  [id number] DATE OF BIRTH:  02-Jan-1925  DATE OF ADMISSION:  08/13/2014  PRIMARY CARE PROVIDER: Halina Maidens, MD  CHIEF COMPLAINT: Diarrhea, black stools and anemia.   HISTORY OF PRESENT ILLNESS: This is an 81 year old Caucasian female patient with history of hypertension, diabetes with a normal colonoscopy 9 years back who presents to the Emergency Room for a second time in the last 1 week. The patient initially presented on 08/08/2014 when she had diarrhea, which was watery, black, along with vomiting. The patient was seen initially in urgent care with stool positive for blood and was sent to the Emergency Room. Here, an ultrasound of the abdomen was done and was found to have some thickening of gallbladder along with stones. Was seen by Dr. Burt Knack of surgery and with no abdominal tenderness, normal liver function tests, this was not thought to be acute cholecystitis. The patient was discharged home to follow up with her primary care physician. At this point the patient's vomiting has resolved. She still mentions that she has had 5 watery black stools in the last 24 hours. A repeat CBC done by primary care physician showed her hemoglobin dropped from 11 to 10. She was sent to the ER and today her hemoglobin is 9, but she has not had any fever, normal white count, no abdominal pain all through this time. She does feel a little weak with the ongoing symptoms. She does take aspirin. Does not use any NSAIDs. No other sick contacts.   She has noticed melena with black stools but no bright red blood per rectum. No blood in her vomiting. No weight loss.   PAST MEDICAL HISTORY: 1. Hypertension.  2. Diabetes.  3. Hyperlipidemia.   PAST SURGICAL HISTORY: Tonsillectomy, adenoidectomy, and umbilical hernia.   FAMILY HISTORY: No colon cancer or gastric cancer.   SOCIAL HISTORY: The patient does not smoke. Rare glass of wine. She is retired.   REVIEW OF  SYSTEMS: CONSTITUTIONAL: Has fatigue, no weight loss.  EYES: No blurred vision, pain or redness.  EARS, NOSE, AND THROAT: No tinnitus, ear pain, or hearing loss.  RESPIRATORY: No cough, wheeze, hemoptysis CARDIOVASCULAR: No chest pain, orthopnea, edema,  GASTROINTESTINAL: Had nausea and vomiting, which have resolved. Continues to have diarrhea and melena.  GENITOURINARY: No dysuria, hematuria, frequency.  ENDOCRINE: No polyuria, nocturia, thyroid problems.  HEMATOLOGIC AND LYMPHATIC: No anemia, easy bruising or bleeding.  INTEGUMENTARY: No acne, rash, lesion.  MUSCULOSKELETAL: No back pain, arthritis.  NEUROLOGIC: No focal numbness, weakness.  PSYCHIATRIC: No anxiety or depression.   HOME MEDICATIONS: 1. Glipizide 5 mg b.i.d.  2. Lisinopril 5 mg daily.  3. Vitamin D 1 tablet daily.  4. Aspirin 81 mg daily.  5. Lovastatin 40 mg daily.  6. Metformin 750 mg 2 times a day.  7. Vitamin C 500 mg daily.   ALLERGIES: PENICILLIN.   PHYSICAL EXAMINATION: VITAL SIGNS: Temperature 98.4, pulse of 97, blood pressure 125/42, saturating 98% on room air.  GENERAL: Obese, Caucasian female patient lying in bed, overall seems comfortable, conversational.  PSYCHIATRIC: Alert and oriented x3. Mood and affect appropriate. Judgment intact.  HEENT: Atraumatic, normocephalic. Oral mucosa moist and pink. External ears and nose normal. Pallor positive. No icterus. Pupils are bilaterally equal and reactive to light.  NECK: Supple. No thyromegaly. No palpable lymph nodes. Trachea midline. No carotid bruit or JVD.  CARDIOVASCULAR: S1, S2, without any murmurs. Peripheral pulses 2+. No edema.  RESPIRATORY: Normal work of breathing. Clear to auscultation  on both sides.  GASTROINTESTINAL: Soft abdomen, nontender. Bowel sounds present. No hepatosplenomegaly palpable.  SKIN: Warm and dry. No petechiae, rash, ulcers.  MUSCULOSKELETAL: No joint swelling, redness, effusion of the large joints. Normal muscle tone.   NEUROLOGICAL: Strength 5/5 in upper and lower extremities. Sensation to fine touch intact all over.  LYMPHATICS: No cervical lymphadenopathy.   LABORATORY STUDIES: Show a glucose of 117, BUN 39, creatinine 1.10, sodium 133, potassium 3.4, GFR 50. AST, ALT, alkaline phosphatase, bilirubin normal. Troponin less than 0.02. WBC 10.6, hemoglobin 9.4, platelets of 259,000 with MCV of 79. Blood group A+. Urinalysis shows trace bacteria and 3 WBCs, which was checked on 08/08/2014. Stool for blood is positive.   IMAGING: Ultrasound of the abdomen checked on 08/08/2014 showed contracted gallbladder with gallbladder wall thickening at 5.4 mm. No Murphy sign. Gallbladder sludge.   ASSESSMENT AND PLAN: 1. Melena with acute blood loss anemia and diarrhea. At this point, the patient's vitals are stable. She does seem dehydrated. Her symptoms could have started with viral gastroenteritis. Her symptoms are improving at this point. She does not have fever, normal. WBC count. We will check stool cultures WBC and Clostridium difficile. We will consult GI for further input. The patient will be on Protonix b.i.d. Repeat hemoglobin later in the evening and tomorrow morning. She does not need transfusion at this point. She may need an EGD/colonoscopy per gastroenterology input. I do not think CT scan would be helpful in this case.  2. Hypertension. Hold the patient's lisinopril as blood pressure is in the normal range. She seemed dehydrated.  3. Diabetes mellitus. Continue metformin. Hold glipizide.  4. Deep vein thrombosis prophylaxis with sequential compression devices.  5. Mild hyponatremia, hypokalemia due to dehydration.  6. Code Status: FULL CODE.  TIME SPENT TODAY ON THIS CASE: 40 minutes.  ____________________________ Leia Alf Romir Klimowicz, MD srs:dw D: 08/13/2014 14:36:00 ET T: 08/13/2014 15:06:58 ET JOB#: [id number]  cc: Halina Maidens, MD Rod Majerus R. Debbra Digiulio, MD, <Dictator>  Neita Carp MD ELECTRONICALLY  SIGNED 09/02/2014 8:23

## 2014-10-19 NOTE — Consult Note (Signed)
Brief Consult Note: Diagnosis: nausea.   Patient was seen by consultant.   Consult note dictated.   Recommend further assessment or treatment.   Discussed with Attending MD.   Comments: no abd pain, never had RUQ pain. n/v three days ago. sent from UC for guiaic pos stools, had colonoscopy less than five years ago. pt can f/u in our office concerning gallstones but no evidence of acute chole to warrant admission. Needs to f/u with Dr Morey Hummingbird for g pos stools discussed with ER MD.  Electronic Signatures: Florene Glen (MD)  (Signed [phone] 00:25)  Authored: Brief Consult Note   Last Updated: 20-Feb-16 00:25 by Florene Glen (MD)

## 2014-10-19 NOTE — Consult Note (Signed)
Chief Complaint:  Subjective/Chief Complaint Patient seen in endoscopy unit before preparation for EGD.  Results of H pylori serology noted positive since her d/c from hospital 2 weeks ago.  At that time she was noted to have a severe GU, with partial Gastric outlet obstruction.  She has not been started on treatment of h pylori.  She is feeling well, on full liquids currently, no abd pain or nausea or emesis.  I offered holding on the scope for today, treating the H pylori, then doing the check up scope in 5-6 weeks, however patietn daughter wish to proceed with scope today for evaluation of the gastric outlet.   As such with proceed.  I have discussed the risks benefits and complications of egd to include not limited to bleeding infection perforation and sedation and she wishes to proceed.   Electronic Signatures: Loistine Simas (MD)  (Signed 11-Mar-16 13:17)  Authored: Chief Complaint   Last Updated: 11-Mar-16 13:17 by Loistine Simas (MD)

## 2014-10-19 NOTE — Discharge Summary (Signed)
PATIENT NAME:  Penny Andrews, Penny Andrews MR#:  [id number] DATE OF BIRTH:  [birth year]  DATE OF ADMISSION:  08/13/2014 DATE OF DISCHARGE:  08/16/2014  ADMITTING DIAGNOSIS: Gastrointestinal bleed.   DISCHARGE DIAGNOSES:  1.  Acute posthemorrhagic anemia.  2.  Nausea, vomiting, diarrhea, resolved status post esophagogastroduodenoscopy revealing a grade D erosive esophagitis, prepyloric pyloric ulcer, suspected partial gastric outlet obstruction due to ulcer.  3.  Abnormal electrocardiogram with bifascicular block, occasional premature ventricular contractions and premature atrial contractions. Echocardiogram showed normal ejection fraction, mild mitral valve regurgitation, aortic sclerosis no stenosis, mild to moderate tricuspid regurgitation, and mildly elevated pulmonary arterial pressure.  4.  History of diabetes mellitus; essential hypertension; hyperlipidemia, not otherwise specified;  and obesity.   DISCHARGE CONDITION: Stable.   DISCHARGE MEDICATIONS: The patient is to continue: Metformin 750 mg p.o. twice daily lovastatin 40 mg p.o. at bedtime, multivitamins once daily, calcium with vitamin D 1 tablet daily, vitamin D3 at 1000 units daily, vitamin C 500 mg p.o. daily, lisinopril 5 mg p.o. at bedtime, Diflucan 100 mg p.o. daily, Protonix 40 mg p.o. twice daily, Tussionex 5 mL twice daily as needed. The patient is not to take aspirin. She is to resume her glipizide whenever her oral intake is better.   DIET: Full liquids only, such as sugar-free Carnation Instant Breakfast or Glucerna 3 times a day. Diet consistency only thin liquids until she is seen by Dr. Gustavo Lah.   ACTIVITY LIMITATIONS: As tolerated.    FOLLOWUP APPOINTMENT: With Dr. Gustavo Lah in 2 days after discharge, Dr. Army Melia in 2 days after discharge.   CONSULTANTS: Care management, social worker, Dr. Lucilla Lame, Dr. Gustavo Lah, Dr. Neoma Laming.   PROCEDURES: Upper GI endoscopy, 08/14/2014, revealing LA grade D erosive esophagitis, erosive  gastritis, pyloric ulceration with partial outlet obstruction from local edema. No specimens collected.   HOSPITAL COURSE: The patient is an 81 year old female with past medical history significant for history of multiple medical problems, who presents to the hospital with complaints of black stools and anemia. Please refer to Dr. Boykin Reaper admission note dictated on the day of admission, 08/13/2014. The patient apparently had diarrhea and vomiting and then presented to the hospital to the Emergency Room. She had a heme positive stool, but it was thought that it was likely viral disease and she was sent home. She came back with watery stools and no abdominal pain and normal white blood cell count. Her hemoglobin level was checked and was found to be low. She was admitted. The patient's initial labs done and arrival to the Emergency Room showed a glucose level of 117, BUN of 39, sodium 133, potassium 3.4, but otherwise BMP was unremarkable. Lipase level was normal at 199. Liver enzymes revealed albumin level of 2.7, otherwise unremarkable. Troponin was less than 0.02. White blood cell count was normal at 10.2, hemoglobin was 9.4, platelet count was 259,000, MCV was low at 78. The patient's absolute neutrophil count was 8.6. The patient was admitted to the hospital for further evaluation. She was given IV fluids and a hemoglobin level dropped down to 7.7 by 08/14/2014. She was seen by gastroenterologist who proceeded with upper gastrointestinal endoscopy on 08/15/2014. She was noted to have ulcerations in pyloric/prepyloric region as well as some swelling in that area and possible suspected partial gastric outlet obstruction. She was also noted to have significant erosive esophagitis. The patient was initiated on IV fluconazole and recommended to continue a clear liquid diet to be advanced to full liquid diet and follow up with  gastroenterology in the next few days after discharge to check healing. The patient was  initiated on current medications and improved clinically. By the day of discharge, 08/16/2014, she was able to eat full liquid diet with no significant discomfort. Her vitals were stable with temperature of 97.9, pulse was ranging from 87-102, her respiration rate was 18, blood pressure ranging from 453-646 systolic and 80H-21Y diastolic, her oxygen saturation was 96%-97% on room air at rest. The patient was advised to continue proton pump inhibitor, Protonix, twice a day and Diflucan daily. She was advised to follow up with Dr. Gustavo Lah in the next few days after discharge for repeated EGD. For now, patient was advised to continue on a full liquid diet until she is seen by Dr. Gustavo Lah.   In regard to her other medical problems such as abnormal EKG which was noted on arrival to the hospital with bifascicular block, occasional PVCs and PACs as well as no significant ST-T wave abnormalities. The patient was seen by cardiologist, Dr. Neoma Laming, who proceeded to get echocardiogram. Echocardiogram was performed on 08/15/2014 and showed ejection fraction of 60%-65%, elevated mean arterial pressure, impaired relaxation pattern of left ventricular diastolic filling, severely dilated left atrium as well as right atrium, mild mitral valve regurgitation, mild to moderate aortic valve sclerosis, calcification without any evidence of aortic stenosis, mildly elevated pulmonary arterial systolic pressure, mild to moderate tricuspid regurgitation, intact interatrial as well interventricular sputum with no echocardiographic evidence of intracardiac shunting. Dr. Humphrey Rolls felt that the patient is stable to undergo procedure, upper GI endoscopy, where patient was taken after echocardiogram was performed.   In regard to her other medical problems such as diabetes, hypertension, hyperlipidemia, the patient is to continue her outpatient management. Since she is going to be taking only full liquid diet, we felt that the patient may  need to hold her glipizide; however, she is to follow her blood glucose levels and make decisions about restarting her back on glipizide if her blood glucose levels are more than 200.    The patient is being discharged in stable condition with the above-mentioned medications and followup.   TIME SPENT: 40 minutes.    ____________________________ Theodoro Grist, MD rv:bm D: 08/16/2014 16:51:53 ET T: 08/16/2014 23:35:59 ET JOB#: [id number]  cc: Theodoro Grist, MD, <Dictator> Lollie Sails, MD Halina Maidens, MD [place] MD ELECTRONICALLY SIGNED 09/02/2014 11:33

## 2014-10-19 NOTE — Consult Note (Signed)
PATIENT NAME:  Penny Andrews, Penny Andrews MR#:  [id number] DATE OF BIRTH:  1924-08-13  DATE OF CONSULTATION:  08/09/2014  REFERRING PHYSICIAN:   CONSULTING PHYSICIAN:  Jerrol Banana. Burt Knack, MD  CHIEF COMPLAINT: Nausea and vomiting.   HISTORY OF PRESENT ILLNESS: This is a patient who had nausea and vomiting on Tuesday and Wednesday, has had no vomiting since, has had some "indigestion," but denies any abdominal pain at any time this week. Denies any abdominal pain at this point in her examination either.   Of note, she was sent over from her urgent care. She went to the urgent care to investigate the cause of her nausea and vomiting that she had earlier in the week. Guaiac testing was done on her stools, which were positive so she was sent to the Emergency Room for guaiac-positive stools. A workup showed a thickened gallbladder wall on ultrasound and the suggestion of gallstones and a contracted gallbladder. I was consulted as a surgical consultation to evaluate for gallbladder disease. However, her chief complaint is nausea without abdominal pain, vomiting 3 or 4 days ago, and guaiac-positive stools. Of note, she has had a colonoscopy within the last 5 years and told she has never had to have one again, presumably, not showing any polyps, etc. She sees Dr. Halina Maidens for primary care in [place].   She denies weight loss. Denies melena, hematochezia. Denies hematemesis and has never had an episode like this before. She thought she had a stomach virus, and that is why she was nauseated but, again, she emphatically denies having any abdominal pain this week or now.   PAST MEDICAL HISTORY: Hypertension, diabetes, and high cholesterol.   PAST SURGICAL HISTORY: Tonsillectomy, adenoidectomy, and umbilical hernia repair, unclear if mesh was utilized.   FAMILY HISTORY: No history of colon cancer.   SOCIAL HISTORY: The patient does not smoke or drink. 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   REVIEW OF  SYSTEMS: A complete system review was performed and negative with the exception of that mentioned in the HPI.   MEDICATIONS: Multiple, see reconciliation.   ALLERGIES: PENICILLIN, CAUSING RASH.   PHYSICAL EXAMINATION:  GENERAL: Healthy, comfortable-appearing female patient.  VITAL SIGNS: Temperature 98.7, pulse 79, respirations 23, blood pressure 140/59. Pain scale of 0, of note, she had a pain scale of 3 and 4 earlier in the admission as recorded by the nursing notes; however, she denies having any abdominal pain during her hospitalization today.  HEENT: Shows no scleral icterus.  NECK: No palpable neck nodes.  CHEST: Clear to auscultation.  CARDIAC: Regular rate and rhythm.  ABDOMEN: Soft. There is an infraumbilical transverse scar. No sign of recurrent hernia. There appears to be a rectus diastases. Abdomen is soft and completely nontender. Negative Murphy sign. No guarding, no rebound. No percussion tenderness.  EXTREMITIES: Without edema.  NEUROLOGIC: Grossly intact.  INTEGUMENT: Shows no jaundice.   LABORATORY VALUES: Demonstrate a white blood cell count of 11,000, and hemoglobin and hematocrit of 11.4 and 36.6 with a platelet count of 222,000. MCV is depressed at 78, slightly. BUN of 33, creatinine of 1, sodium 134, calcium of 11.6.   IMAGING: An ultrasound is reviewed showing contracted gallbladder with sludge, possible stones,  thickened gallbladder wall, but a negative sonographic Murphy's sign.   ASSESSMENT AND PLAN: This is a patient with guaiac-positive stools sent over from an urgent care for evaluation in the Emergency Room. The patient and her daughter and I discussed of this and I  suggested that this be followed up as an outpatient with Dr. Army Melia. She may require an endoscopy. She has had a colonoscopy in the last few years, and it is unlikely that she has neglected colon cancer; however, this could be peptic ulcer disease causing all of this and an upper gastrointestinal  study may be a in order.   Of note, her gallbladder is not inflamed either on physical exam or on sonographic Murphy sign evidence. She has never had any abdominal pain, and it is completely nontender. While she has gallstones, this may warrant removal at some point, but only if she has had symptoms from them. I reviewed the symptoms with the daughter and the patient. They wish to go home at this point and I am in agreement. I spoke with Dr. (Dictation Anomaly)<<Dewey>> concerning the patient's disposition. I would recommend nausea medication and follow up in my office and Dr. Gaspar Cola office.    ____________________________ Jerrol Banana. Burt Knack, MD rec:bm D: 08/09/2014 00:33:01 ET T: 08/09/2014 03:48:12 ET JOB#: [id number]  cc: Jerrol Banana. Burt Knack, MD, <Dictator> Florene Glen MD ELECTRONICALLY SIGNED 08/09/2014 7:04

## 2014-10-19 NOTE — Consult Note (Signed)
Chief Complaint:  Subjective/Chief Complaint Patient feels weel. No complaints today except a cough. Reports eating well. Had EGD with esophagitis and gastric outlet obstruction.   VITAL SIGNS/ANCILLARY NOTES: **Vital Signs.:   27-Feb-16 04:42  Vital Signs Type Q 4hr  Temperature Temperature (F) 98.3  Celsius 36.8  Temperature Source oral  Pulse Pulse 68  Respirations Respirations 18  Systolic BP Systolic BP 254  Diastolic BP (mmHg) Diastolic BP (mmHg) 68  Mean BP 94  Pulse Ox % Pulse Ox % 93  Pulse Ox Activity Level  At rest  Oxygen Delivery Room Air/ 21 %   Brief Assessment:  GEN well developed, well nourished   Respiratory normal resp effort   Additional Physical Exam Alert and orientated times 3   Lab Results: Routine Hem:  27-Feb-16 05:53   Hemoglobin (CBC)  8.8 (Result(s) reported on 16 Aug 2014 at 06:45AM.)   Assessment/Plan:  Assessment/Plan:  Assessment Esophagitis. Gastritis.   Plan Patient tolerating liquids. Patient to follow up as outpatient for repeat EGD with Dr. Gustavo Lah.   Electronic Signatures: Lucilla Lame (MD)  (Signed [id number] 08:27)  Authored: Chief Complaint, VITAL SIGNS/ANCILLARY NOTES, Brief Assessment, Lab Results, Assessment/Plan   Last Updated: 27-Feb-16 08:27 by Lucilla Lame (MD)

## 2014-10-31 ENCOUNTER — Encounter: Payer: Self-pay | Admitting: Internal Medicine

## 2014-10-31 DIAGNOSIS — J309 Allergic rhinitis, unspecified: Secondary | ICD-10-CM | POA: Insufficient documentation

## 2014-10-31 DIAGNOSIS — B3781 Candidal esophagitis: Secondary | ICD-10-CM | POA: Insufficient documentation

## 2014-10-31 DIAGNOSIS — R251 Tremor, unspecified: Secondary | ICD-10-CM | POA: Insufficient documentation

## 2014-10-31 DIAGNOSIS — B9681 Helicobacter pylori [H. pylori] as the cause of diseases classified elsewhere: Secondary | ICD-10-CM | POA: Insufficient documentation

## 2014-10-31 DIAGNOSIS — E1169 Type 2 diabetes mellitus with other specified complication: Secondary | ICD-10-CM | POA: Insufficient documentation

## 2014-10-31 DIAGNOSIS — Q396 Congenital diverticulum of esophagus: Secondary | ICD-10-CM | POA: Insufficient documentation

## 2014-10-31 DIAGNOSIS — E559 Vitamin D deficiency, unspecified: Secondary | ICD-10-CM | POA: Insufficient documentation

## 2014-10-31 DIAGNOSIS — E785 Hyperlipidemia, unspecified: Secondary | ICD-10-CM

## 2014-10-31 DIAGNOSIS — K289 Gastrojejunal ulcer, unspecified as acute or chronic, without hemorrhage or perforation: Secondary | ICD-10-CM

## 2014-10-31 DIAGNOSIS — Z8711 Personal history of peptic ulcer disease: Secondary | ICD-10-CM | POA: Insufficient documentation

## 2014-10-31 DIAGNOSIS — I10 Essential (primary) hypertension: Secondary | ICD-10-CM | POA: Insufficient documentation

## 2014-11-04 ENCOUNTER — Encounter
Admission: RE | Disposition: A | Discharge status: Home / Self Care | Payer: Self-pay | Source: Ambulatory Visit | Attending: Gastroenterology

## 2014-11-04 ENCOUNTER — Ambulatory Visit: Payer: Medicare Other | Admitting: Anesthesiology

## 2014-11-04 ENCOUNTER — Ambulatory Visit
Admission: RE | Admit: 2014-11-04 | Discharge: 2014-11-04 | Disposition: A | Discharge status: Home / Self Care | Payer: Medicare Other | Source: Ambulatory Visit | Attending: Gastroenterology | Admitting: Gastroenterology

## 2014-11-04 DIAGNOSIS — Z880 Allergy status to penicillin: Secondary | ICD-10-CM | POA: Diagnosis not present

## 2014-11-04 DIAGNOSIS — K2960 Other gastritis without bleeding: Secondary | ICD-10-CM | POA: Diagnosis not present

## 2014-11-04 DIAGNOSIS — Z8711 Personal history of peptic ulcer disease: Secondary | ICD-10-CM | POA: Diagnosis not present

## 2014-11-04 DIAGNOSIS — I10 Essential (primary) hypertension: Secondary | ICD-10-CM | POA: Insufficient documentation

## 2014-11-04 DIAGNOSIS — K449 Diaphragmatic hernia without obstruction or gangrene: Secondary | ICD-10-CM | POA: Diagnosis not present

## 2014-11-04 DIAGNOSIS — K5710 Diverticulosis of small intestine without perforation or abscess without bleeding: Secondary | ICD-10-CM | POA: Insufficient documentation

## 2014-11-04 DIAGNOSIS — E119 Type 2 diabetes mellitus without complications: Secondary | ICD-10-CM | POA: Diagnosis not present

## 2014-11-04 DIAGNOSIS — Z7982 Long term (current) use of aspirin: Secondary | ICD-10-CM | POA: Diagnosis not present

## 2014-11-04 DIAGNOSIS — K2950 Unspecified chronic gastritis without bleeding: Secondary | ICD-10-CM | POA: Insufficient documentation

## 2014-11-04 DIAGNOSIS — Z09 Encounter for follow-up examination after completed treatment for conditions other than malignant neoplasm: Secondary | ICD-10-CM | POA: Diagnosis not present

## 2014-11-04 DIAGNOSIS — D509 Iron deficiency anemia, unspecified: Secondary | ICD-10-CM | POA: Diagnosis not present

## 2014-11-04 DIAGNOSIS — Z79899 Other long term (current) drug therapy: Secondary | ICD-10-CM | POA: Insufficient documentation

## 2014-11-04 DIAGNOSIS — K210 Gastro-esophageal reflux disease with esophagitis: Secondary | ICD-10-CM | POA: Diagnosis not present

## 2014-11-04 DIAGNOSIS — K2970 Gastritis, unspecified, without bleeding: Secondary | ICD-10-CM | POA: Diagnosis not present

## 2014-11-04 HISTORY — DX: Gastro-esophageal reflux disease without esophagitis: K21.9

## 2014-11-04 HISTORY — DX: Essential (primary) hypertension: I10

## 2014-11-04 HISTORY — DX: Type 2 diabetes mellitus without complications: E11.9

## 2014-11-04 LAB — GLUCOSE, CAPILLARY: GLUCOSE-CAPILLARY: 94 mg/dL (ref 65–99)

## 2014-11-04 SURGERY — EGD (ESOPHAGOGASTRODUODENOSCOPY)
Anesthesia: General

## 2014-11-04 MED ORDER — PROPOFOL INFUSION 10 MG/ML OPTIME
INTRAVENOUS | Status: DC | PRN
  Administered 2014-11-04: 140 ug/kg/min via INTRAVENOUS

## 2014-11-04 MED ORDER — SODIUM CHLORIDE 0.9 % IV SOLN: INTRAVENOUS | Status: DC

## 2014-11-04 MED ORDER — SODIUM CHLORIDE 0.9 % IV SOLN
INTRAVENOUS | Status: DC
  Administered 2014-11-04: 15:00:00 via INTRAVENOUS

## 2014-11-04 MED ORDER — PANTOPRAZOLE SODIUM 40 MG PO TBEC: 40.0000 mg | DELAYED_RELEASE_TABLET | Freq: Every day | ORAL | Status: DC

## 2014-11-04 NOTE — Anesthesia Procedure Notes (Signed)
Performed by: Jonna Clark Pre-anesthesia Checklist: Patient identified, Emergency Drugs available, Suction available, Patient being monitored and Timeout performed Patient Re-evaluated:Patient Re-evaluated prior to inductionOxygen Delivery Method: Nasal cannula

## 2014-11-04 NOTE — H&P (Signed)
Outpatient short stay form Pre-procedure 11/04/2014 3:28 PM Penny Sails MD  Primary Physician: Halina Maidens M.D.  Reason for visit:  Follow-up peptic ulcer disease  History of present illness:  Is Penny Andrews is an 81 year old female who presents today in follow-up in regards to her history of peptic ulcer disease/duodenal ulcer/upper GI bleeding. She was initially seen about 2 months ago with a upper GI bleed. He was found have a very severe ulcer with partial outlet obstruction. She was Helicobacter pylori positive and she has been treated for that. She is presenting today for follow-up EGD. He is not taking anticoagulants or aspirin. She has been continuing on twice a day Protonix    Current facility-administered medications:  .  0.9 %  sodium chloride infusion, , Intravenous, Continuous, Penny Sails, MD .  0.9 %  sodium chloride infusion, , Intravenous, Continuous, Penny Sails, MD  Prescriptions prior to admission  Medication Sig Dispense Refill Last Dose  . aspirin 81 MG tablet Take 81 mg by mouth every evening.     . cholecalciferol (VITAMIN D) 1000 UNITS tablet Take 1,000 Units by mouth daily.     Marland Kitchen glipiZIDE (GLUCOTROL) 5 MG tablet Take 5 mg by mouth 2 (two) times daily before a meal.     . lisinopril (PRINIVIL,ZESTRIL) 5 MG tablet Take 5 mg by mouth daily.     Marland Kitchen lovastatin (MEVACOR) 40 MG tablet Take 40 mg by mouth every evening.     . metFORMIN (GLUCOPHAGE-XR) 750 MG 24 hr tablet Take 750 mg by mouth 2 (two) times daily.     . Multiple Vitamin (MULTIVITAMIN WITH MINERALS) TABS tablet Take 1 tablet by mouth daily.     . pantoprazole (PROTONIX) 40 MG tablet Take 1 tablet by mouth 2 (two) times daily.     . vitamin C (ASCORBIC ACID) 500 MG tablet Take 500 mg by mouth daily.        Allergies  Allergen Reactions  . Penicillins Swelling     Past Medical History  Diagnosis Date  . Diabetes mellitus without complication     Patient reported  . GERD  (gastroesophageal reflux disease)   . Hypertension     Review of systems:      Physical Exam    Heart and lungs: Regular rate and rhythm without rub or gallop lungs are bilaterally clear    HEENT: Normocephalic atraumatic eyes are anicteric    Other:     Pertinant exam for procedure: Soft nontender nondistended bowel sounds positive normoactive    Planned proceedures: EGD. I have discussed the risks benefits and complications of procedures to include not limited to bleeding, infection, perforation and the risk of sedation and the patient wishes to proceed.    Penny Sails, MD Gastroenterology 11/04/2014  3:28 PM

## 2014-11-04 NOTE — Anesthesia Preprocedure Evaluation (Addendum)
Anesthesia Evaluation  Patient identified by MRN, date of birth, ID band Patient awake    Reviewed: Allergy & Precautions, H&P , NPO status , Patient's Chart, lab work & pertinent test results, reviewed documented beta blocker date and time   Airway Mallampati: II  TM Distance: >3 FB Neck ROM: full    Dental no notable dental hx.    Pulmonary neg pulmonary ROS,  breath sounds clear to auscultation  Pulmonary exam normal       Cardiovascular Exercise Tolerance: Good hypertension, negative cardio ROS  Rhythm:regular Rate:Normal     Neuro/Psych negative neurological ROS  negative psych ROS   GI/Hepatic negative GI ROS, Neg liver ROS, GERD-  ,  Endo/Other  negative endocrine ROSdiabetes  Renal/GU negative Renal ROS  negative genitourinary   Musculoskeletal   Abdominal   Peds  Hematology negative hematology ROS (+)   Anesthesia Other Findings   Reproductive/Obstetrics negative OB ROS                            Anesthesia Physical Anesthesia Plan  ASA: II  Anesthesia Plan: General   Post-op Pain Management:    Induction:   Airway Management Planned:   Additional Equipment:   Intra-op Plan:   Post-operative Plan:   Informed Consent: I have reviewed the patients History and Physical, chart, labs and discussed the procedure including the risks, benefits and alternatives for the proposed anesthesia with the patient or authorized representative who has indicated his/her understanding and acceptance.   Dental Advisory Given  Plan Discussed with: CRNA  Anesthesia Plan Comments:         Anesthesia Quick Evaluation

## 2014-11-04 NOTE — Op Note (Signed)
[facility] Gastroenterology Patient Name: Penny Andrews Procedure Date: 11/04/2014 1:44 PM MRN: [id number] Account #: [id number] Date of Birth: July 04, 1924 Admit Type: Outpatient Age: 81 Room: [facility] ENDO ROOM 3 Gender: Female Note Status: Finalized Procedure:         Upper GI endoscopy Indications:       Follow-up of acute duodenal ulcer with hemorrhage and                     obstruction Providers:         Lollie Sails, MD Referring MD:      No Local Md, MD (Referring MD), Halina Maidens, MD                     (Referring MD) Medicines:         Monitored Anesthesia Care Complications:     No immediate complications. Procedure:         Pre-Anesthesia Assessment:                    - ASA Grade Assessment: III - A patient with severe                     systemic disease.                    After obtaining informed consent, the endoscope was passed                     under direct vision. Throughout the procedure, the                     patient's blood pressure, pulse, and oxygen saturations                     were monitored continuously. The Olympus GIF-160 endoscope                     (S#. [id number]) was introduced through the mouth, and                     advanced to the third part of duodenum. The upper GI                     endoscopy was accomplished without difficulty. The patient                     tolerated the procedure well. Findings:      A small hiatus hernia was found. The Z-line was a variable distance from       incisors; the hiatal hernia was sliding.      The Z-line was variable. Biopsies were taken with a cold forceps for       histology.      The exam of the esophagus was otherwise normal.      Diffuse mild inflammation characterized by congestion (edema) and       erythema was found in the gastric body. Biopsies were taken with a cold       forceps for histology.      The cardia and gastric fundus were normal on retroflexion.      A  moderate post-ulcer deformity was found in the second part of the       duodenum with some noted tortuosity in the proximal C-loop.      A medium to  large-sized diverticulum was found in the second part of the       duodenum. The biliary opening is noted with free flow of bile.      The exam of the duodenum was otherwise normal. Impression:        - Small hiatus hernia.                    - Z-line variable. Biopsied.                    - Gastritis. Biopsied.                    - Duodenal deformity.                    - Duodenal diverticulum. Recommendation:    - Discharge patient to home.                    - Await pathology results.                    - Telephone GI clinic for pathology results in 1 week.                    - Advance diet as tolerated.                    - Use Protonix (pantoprazole) 40 mg PO daily daily. Procedure Code(s): --- Professional ---                    [phone], Esophagogastroduodenoscopy, flexible, transoral;                     with biopsy, single or multiple Diagnosis Code(s): --- Professional ---                    530.89, Other specified disorders of esophagus                    535.50, Unspecified gastritis and gastroduodenitis,                     without mention of hemorrhage                    537.89, Other specified disorders of stomach and duodenum                    532.01, Acute duodenal ulcer with hemorrhage, with                     obstruction                    553.3, Diaphragmatic hernia without mention of obstruction                     or gangrene                    562.00, Diverticulosis of small intestine (without mention                     of hemorrhage) CPT copyright 2014 American Medical Association. All rights reserved. The codes documented in this report are preliminary and upon coder review may  be revised to meet current compliance requirements. Lollie Sails, MD 11/04/2014 4:02:14 PM This report has been signed  electronically. Number  of Addenda: 0 Note Initiated On: 11/04/2014 1:44 PM      [facility]

## 2014-11-04 NOTE — Anesthesia Postprocedure Evaluation (Signed)
  Anesthesia Post-op Note  Patient: Penny Andrews  Procedure(s) Performed: Procedure(s): ESOPHAGOGASTRODUODENOSCOPY (EGD) (N/A)  Anesthesia type:General  Patient location: PACU  Post pain: Pain level controlled  Post assessment: Post-op Vital signs reviewed, Patient's Cardiovascular Status Stable, Respiratory Function Stable, Patent Airway and No signs of Nausea or vomiting  Post vital signs: Reviewed and stable  Last Vitals:  Filed Vitals:   11/04/14 1407  BP: 172/71  Pulse: 77  Temp: 35.5 C  Resp: 16    Level of consciousness: awake, alert  and patient cooperative  Complications: No apparent anesthesia complications

## 2014-11-04 NOTE — Transfer of Care (Signed)
Immediate Anesthesia Transfer of Care Note  Patient: Penny Andrews  Procedure(s) Performed: Procedure(s): ESOPHAGOGASTRODUODENOSCOPY (EGD) (N/A)  Patient Location: PACU and Endoscopy Unit  Anesthesia Type:General  Level of Consciousness: sedated and patient cooperative  Airway & Oxygen Therapy: Patient Spontanous Breathing and Patient connected to face mask oxygen  Post-op Assessment: Report given to RN and Post -op Vital signs reviewed and stable  Post vital signs: Reviewed and stable  Last Vitals:  Filed Vitals:   11/04/14 1407  BP: 172/71  Pulse: 77  Temp: 35.5 C  Resp: 16    Complications: No apparent anesthesia complications

## 2014-11-11 LAB — SURGICAL PATHOLOGY

## 2014-11-13 DIAGNOSIS — D509 Iron deficiency anemia, unspecified: Secondary | ICD-10-CM | POA: Diagnosis not present

## 2014-12-01 ENCOUNTER — Ambulatory Visit
Admission: EM | Admit: 2014-12-01 | Discharge: 2014-12-01 | Disposition: A | Discharge status: Home / Self Care | Payer: Medicare Other | Attending: Internal Medicine | Admitting: Internal Medicine

## 2014-12-01 ENCOUNTER — Encounter: Payer: Self-pay | Admitting: Emergency Medicine

## 2014-12-01 DIAGNOSIS — J019 Acute sinusitis, unspecified: Secondary | ICD-10-CM | POA: Diagnosis not present

## 2014-12-01 MED ORDER — BENZONATATE 100 MG PO CAPS: 200.0000 mg | ORAL_CAPSULE | Freq: Three times a day (TID) | ORAL | Status: DC

## 2014-12-01 MED ORDER — FLUTICASONE PROPIONATE 50 MCG/ACT NA SUSP: 2.0000 | Freq: Every day | NASAL | Status: DC

## 2014-12-01 MED ORDER — AZITHROMYCIN 250 MG PO TABS: 250.0000 mg | ORAL_TABLET | Freq: Every day | ORAL | Status: DC

## 2014-12-01 NOTE — ED Notes (Signed)
Cough, productive at times with green mucus. Also sneezing some out. Started 1.5 weeks ago. Having trouble sleeping through the night because of cough. No fever. No sick contacts. No facial pressure or pain.

## 2014-12-01 NOTE — ED Provider Notes (Signed)
CSN: [id number]     Arrival date & time 12/01/14  7425 History   First MD Initiated Contact with Patient 12/01/14 [phone]     Chief Complaint  Patient presents with  . Cough   HPI    Expand All Collapse All   Cough, productive at times with green mucus. Also sneezing some out. Started 1.5 weeks ago. Having trouble sleeping through the night because of cough. No fever. No sick contacts. No facial pressure or pain. no nausea, no vomiting, no diarrhea. No headache. Increasing congestion in the nose and sinuses. Postnasal drainage.          Past Medical History  Diagnosis Date  . Diabetes mellitus without complication     Patient reported  . GERD (gastroesophageal reflux disease)   . Hypertension    Past Surgical History  Procedure Laterality Date  . Eye surgery Bilateral     cataracts  . Umbilical hernia repair    . Tonsillectomy    . Esophagogastroduodenoscopy  07/2014    grade D esophagitis; prepyloric ulcer   Family History  Problem Relation Age of Onset  . CAD Father   . Cancer Mother    History  Substance Use Topics  . Smoking status: Never Smoker   . Smokeless tobacco: Never Used  . Alcohol Use: 0.0 oz/week    0 Standard drinks or equivalent per week     Comment: occasionally once a year   OB History    No data available     Review of Systems  All other systems reviewed and are negative.   Allergies  Penicillins  Home Medications   Prior to Admission medications   Medication Sig Start Date End Date Taking? Authorizing Provider  ferrous fumarate (HEMOCYTE - 106 MG FE) 325 (106 FE) MG TABS tablet Take 1 tablet by mouth daily.   Yes Historical Provider, MD  aspirin 81 MG tablet Take 81 mg by mouth every evening.    Historical Provider, MD  cholecalciferol (VITAMIN D) 1000 UNITS tablet Take 1,000 Units by mouth daily.    Historical Provider, MD  glipiZIDE (GLUCOTROL) 5 MG tablet Take 5 mg by mouth 2 (two) times daily before a meal.    Historical Provider, MD   lisinopril (PRINIVIL,ZESTRIL) 5 MG tablet Take 5 mg by mouth daily.    Historical Provider, MD  lovastatin (MEVACOR) 40 MG tablet Take 40 mg by mouth every evening.    Historical Provider, MD  metFORMIN (GLUCOPHAGE-XR) 750 MG 24 hr tablet Take 750 mg by mouth 2 (two) times daily.    Historical Provider, MD  Multiple Vitamin (MULTIVITAMIN WITH MINERALS) TABS tablet Take 1 tablet by mouth daily.    Historical Provider, MD  pantoprazole (PROTONIX) 40 MG tablet Take 1 tablet (40 mg total) by mouth daily. 11/04/14   Lollie Sails, MD  vitamin C (ASCORBIC ACID) 500 MG tablet Take 500 mg by mouth daily.    Historical Provider, MD   BP 167/70 mmHg  Pulse 77  Temp(Src) 98.2 F (36.8 C) (Oral)  Resp 18  Ht 5' 3.5" (1.613 m)  Wt 170 lb (77.111 kg)  BMI 29.64 kg/m2  SpO2 97% Physical Exam  Constitutional: She is oriented to person, place, and time. No distress.  Alert, nicely groomed  HENT:  Head: Atraumatic.  Bilaterally TMs have marked dullness, no erythema Mild nasal congestion bilaterally Throat slightly red with postnasal drainage  Eyes:  Conjugate gaze, no eye redness/drainage  Neck: Neck supple.  Cardiovascular: Normal  rate and regular rhythm.   Pulmonary/Chest: No respiratory distress. She has no wheezes. She has no rales.  Lungs clear, symmetric breath sounds  Abdominal: She exhibits no distension.  Musculoskeletal: Normal range of motion.  No leg swelling  Neurological: She is alert and oriented to person, place, and time.  Skin: Skin is warm and dry.  No cyanosis  Nursing note and vitals reviewed.   ED Course  Procedures (including critical care time) Labs Review Labs Reviewed - No data to display  Imaging Review No results found.   MDM   1. Acute sinusitis, recurrence not specified, unspecified location    Prescription for Tessalon Perles for cough, Zithromax. Nasal steroid spray but also be beneficial, prescription for Flonase sent to the pharmacy. Recheck or  follow up PCP/Dr. Army Melia, if not improving in a few days. Anticipate slow improvement over the next 2-3 weeks.    Sherlene Shams, MD 12/01/14 [phone]

## 2014-12-01 NOTE — Discharge Instructions (Signed)
Prescriptions for zithromax and tessalon perles sent to [facility].  Flonase or nasacort spray would be helpful for sinus/nose congestion.  Anticipate slow improvement over the next 2-3 weeks.  Sinusitis Sinusitis is redness, soreness, and puffiness (inflammation) of the air pockets in the bones of your face (sinuses). The redness, soreness, and puffiness can cause air and mucus to get trapped in your sinuses. This can allow germs to grow and cause an infection.  HOME CARE   Drink enough fluids to keep your pee (urine) clear or pale yellow.  Use a humidifier in your home.  Run a hot shower to create steam in the bathroom. Sit in the bathroom with the door closed. Breathe in the steam 3-4 times a day.  Put a warm, moist washcloth on your face 3-4 times a day, or as told by your doctor.  Use salt water sprays (saline sprays) to wet the thick fluid in your nose. This can help the sinuses drain.  Only take medicine as told by your doctor. GET HELP RIGHT AWAY IF:   Your pain gets worse.  You have very bad headaches.  You are sick to your stomach (nauseous).  You throw up (vomit).  You are very sleepy (drowsy) all the time.  Your face is puffy (swollen).  Your vision changes.  You have a stiff neck.  You have trouble breathing. MAKE SURE YOU:   Understand these instructions.  Will watch your condition.  Will get help right away if you are not doing well or get worse. Document Released: 11/23/2007 Document Revised: 02/29/2012 Document Reviewed: 01/10/2012 [facility] Patient Information 2015 [place], [place]. This information is not intended to replace advice given to you by your health care provider. Make sure you discuss any questions you have with your health care provider.

## 2014-12-09 DIAGNOSIS — D649 Anemia, unspecified: Secondary | ICD-10-CM | POA: Diagnosis not present

## 2014-12-12 ENCOUNTER — Other Ambulatory Visit: Payer: Self-pay | Admitting: Internal Medicine

## 2014-12-12 ENCOUNTER — Encounter: Payer: Self-pay | Admitting: Internal Medicine

## 2014-12-12 ENCOUNTER — Ambulatory Visit (INDEPENDENT_AMBULATORY_CARE_PROVIDER_SITE_OTHER): Payer: Medicare Other | Admitting: Internal Medicine

## 2014-12-12 VITALS — BP 142/64 | HR 48 | Temp 98.6°F | Ht 63.5 in | Wt 176.6 lb

## 2014-12-12 DIAGNOSIS — R059 Cough, unspecified: Secondary | ICD-10-CM

## 2014-12-12 DIAGNOSIS — I10 Essential (primary) hypertension: Secondary | ICD-10-CM | POA: Diagnosis not present

## 2014-12-12 DIAGNOSIS — E119 Type 2 diabetes mellitus without complications: Secondary | ICD-10-CM | POA: Diagnosis not present

## 2014-12-12 DIAGNOSIS — R05 Cough: Secondary | ICD-10-CM | POA: Diagnosis not present

## 2014-12-12 MED ORDER — BENZONATATE 100 MG PO CAPS: 200.0000 mg | ORAL_CAPSULE | Freq: Three times a day (TID) | ORAL | Status: DC

## 2014-12-12 MED ORDER — GLIPIZIDE 5 MG PO TABS: 5.0000 mg | ORAL_TABLET | Freq: Two times a day (BID) | ORAL | Status: DC

## 2014-12-12 MED ORDER — LOVASTATIN 40 MG PO TABS: 40.0000 mg | ORAL_TABLET | Freq: Every evening | ORAL | Status: DC

## 2014-12-12 MED ORDER — METFORMIN HCL ER 750 MG PO TB24: 750.0000 mg | ORAL_TABLET | Freq: Two times a day (BID) | ORAL | Status: DC

## 2014-12-12 MED ORDER — LISINOPRIL 5 MG PO TABS: 5.0000 mg | ORAL_TABLET | Freq: Every day | ORAL | Status: DC

## 2014-12-12 NOTE — Progress Notes (Signed)
Date:  12/12/2014   Name:  Penny Andrews   DOB:  1925/02/04   MRN:  [id number]   Chief Complaint: Sinusitis and Cough She was seen in UC and given Flonase and a Zpak 10 days ago for sinusitis.  She feels better - the phlegm is now clear but she is still coughing - mostly at night.  She had tessalon perles but has taken them all.  She denies fever, wheezing or shortness of breath. She is now seeing Dr. Donnella Sham for her gastritis.  She is on iron supplementation and doing well.  Her last EGD per report was good. She needs refills on all of her medication.  She is due for a DM follow up next month.  Last HbgA1C was good.  Review of Systems:  Review of Systems  Constitutional: Negative for fever, chills and appetite change.  HENT: Positive for postnasal drip. Negative for sinus pressure, sore throat and trouble swallowing.   Respiratory: Positive for cough. Negative for chest tightness, shortness of breath and wheezing.   Cardiovascular: Negative for chest pain.  Gastrointestinal: Negative for abdominal pain and diarrhea.  Neurological: Negative for dizziness and light-headedness.    Patient Active Problem List   Diagnosis Date Noted  . Allergic rhinitis 10/31/2014  . Candida esophagitis 10/31/2014  . Controlled diabetes mellitus type II without complication 23/76/2831  . 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 Essential (primary) hypertension 10/31/2014  . 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 Gastrointestinal ulcer due to Helicobacter pylori 51/76/1607  . Has a tremor 10/31/2014  . Avitaminosis D 10/31/2014    Prior to Admission medications   Medication Sig Start Date End Date Taking? Authorizing Provider  ACCU-CHEK SOFTCLIX LANCETS lancets  09/04/14  Yes Historical Provider, MD  aspirin 81 MG tablet Take 81 mg by mouth every evening.   Yes Historical Provider, MD  Blood Glucose Monitoring Suppl  (ACCU-CHEK AVIVA PLUS) W/DEVICE KIT U UTD 09/04/14  Yes Historical Provider, MD  cholecalciferol (VITAMIN D) 1000 UNITS tablet Take 1,000 Units by mouth daily.   Yes Historical Provider, MD  ferrous fumarate (HEMOCYTE - 106 MG FE) 325 (106 FE) MG TABS tablet Take 1 tablet by mouth daily.   Yes Historical Provider, MD  fluticasone (FLONASE) 50 MCG/ACT nasal spray Place 2 sprays into both nostrils daily. 12/01/14 12/15/14 Yes Sherlene Shams, MD  glipiZIDE (GLUCOTROL) 5 MG tablet Take 5 mg by mouth 2 (two) times daily before a meal.   Yes Historical Provider, MD  glucose blood test strip  09/04/14  Yes Historical Provider, MD  lisinopril (PRINIVIL,ZESTRIL) 5 MG tablet Take 5 mg by mouth daily.   Yes Historical Provider, MD  lovastatin (MEVACOR) 40 MG tablet Take 40 mg by mouth every evening.   Yes Historical Provider, MD  metFORMIN (GLUCOPHAGE-XR) 750 MG 24 hr tablet Take 750 mg by mouth 2 (two) times daily.   Yes Historical Provider, MD  Multiple Vitamin (MULTIVITAMIN WITH MINERALS) TABS tablet Take 1 tablet by mouth daily.   Yes Historical Provider, MD  vitamin C (ASCORBIC ACID) 500 MG tablet Take 500 mg by mouth daily.   Yes Historical Provider, MD  azithromycin (ZITHROMAX) 250 MG tablet Take 1 tablet (250 mg total) by mouth daily. Take first 2 tablets together, then 1 every day until finished. Patient not taking: Reported on 12/12/2014 12/01/14   Sherlene Shams, MD  benzonatate (TESSALON) 100 MG capsule Take 2 capsules (200 mg total)  by mouth 3 (three) times daily. Patient not taking: Reported on 12/12/2014 12/01/14   Sherlene Shams, MD  pantoprazole (PROTONIX) 40 MG tablet Take 1 tablet (40 mg total) by mouth daily. Patient not taking: Reported on 12/12/2014 11/04/14   Lollie Sails, MD    Allergies  Allergen Reactions  . Penicillins Swelling    Past Surgical History  Procedure Laterality Date  . Eye surgery Bilateral     cataracts  . Umbilical hernia repair    . Tonsillectomy    .  Esophagogastroduodenoscopy  07/2014    grade D esophagitis; prepyloric ulcer    History  Substance Use Topics  . Smoking status: Never Smoker   . Smokeless tobacco: Never Used  . Alcohol Use: 0.0 oz/week    0 Standard drinks or equivalent per week     Comment: occasionally once a year   Lab Results  Component Value Date   HGBA1C 6.9* 08/22/2014     Medication list has been reviewed and updated.  Physical Examination:  Physical Exam  Constitutional: She is oriented to person, place, and time. She appears well-developed and well-nourished.  Eyes: Conjunctivae are normal. Pupils are equal, round, and reactive to light.  Neck: Normal range of motion. Neck supple. No thyromegaly present.  Cardiovascular: Normal rate, regular rhythm and normal heart sounds.   Pulmonary/Chest: Breath sounds normal. She has no wheezes. She has no rales.  Lymphadenopathy:    She has no cervical adenopathy.  Neurological: She is alert and oriented to person, place, and time.  Psychiatric: She has a normal mood and affect.    BP 142/64 mmHg  Pulse 48  Temp(Src) 98.6 F (37 C)  Ht 5' 3.5" (1.613 m)  Wt 176 lb 9.6 oz (80.105 kg)  BMI 30.79 kg/m2  SpO2 98%  Assessment and Plan: 1. Cough No further antibiotics are needed Will refill tessalon - benzonatate (TESSALON) 100 MG capsule; Take 2 capsules (200 mg total) by mouth 3 (three) times daily.  Dispense: 30 capsule; Refill: 0  2. Controlled diabetes mellitus type II without complication Continue current medication A1C next visit - metFORMIN (GLUCOPHAGE-XR) 750 MG 24 hr tablet; Take 1 tablet (750 mg total) by mouth 2 (two) times daily.  Dispense: 180 tablet; Refill: 3 - glipiZIDE (GLUCOTROL) 5 MG tablet; Take 1 tablet (5 mg total) by mouth 2 (two) times daily before a meal.  Dispense: 90 tablet; Refill: 3  3. Essential (primary) hypertension The current medical regimen is effective;  continue present plan and medications. - lisinopril  (PRINIVIL,ZESTRIL) 5 MG tablet; Take 1 tablet (5 mg total) by mouth daily.  Dispense: 90 tablet; Refill: [place], MD [facility]  12/12/2014

## 2015-01-14 ENCOUNTER — Encounter: Payer: Self-pay | Admitting: Internal Medicine

## 2015-01-14 ENCOUNTER — Ambulatory Visit (INDEPENDENT_AMBULATORY_CARE_PROVIDER_SITE_OTHER): Payer: Medicare Other | Admitting: Internal Medicine

## 2015-01-14 VITALS — BP 124/70 | HR 76 | Ht 63.5 in | Wt 172.6 lb

## 2015-01-14 DIAGNOSIS — I10 Essential (primary) hypertension: Secondary | ICD-10-CM

## 2015-01-14 DIAGNOSIS — E119 Type 2 diabetes mellitus without complications: Secondary | ICD-10-CM

## 2015-01-14 DIAGNOSIS — H9193 Unspecified hearing loss, bilateral: Secondary | ICD-10-CM

## 2015-01-14 DIAGNOSIS — H9190 Unspecified hearing loss, unspecified ear: Secondary | ICD-10-CM | POA: Insufficient documentation

## 2015-01-14 MED ORDER — GLIPIZIDE 5 MG PO TABS: 5.0000 mg | ORAL_TABLET | Freq: Two times a day (BID) | ORAL | Status: DC

## 2015-01-14 NOTE — Progress Notes (Signed)
Date:  01/14/2015   Name:  Penny Andrews   DOB:  [birth year]   MRN:  [id number]   Chief Complaint: Diabetes Diabetes She presents for her follow-up diabetic visit. She has type 2 diabetes mellitus. Her disease course has been stable. There are no hypoglycemic associated symptoms. Pertinent negatives for hypoglycemia include no dizziness. Pertinent negatives for diabetes include no chest pain, no fatigue, no polydipsia and no polyuria. Pertinent negatives for hypoglycemia complications include no blackouts. Symptoms are stable. She is compliant with treatment most of the time. Her breakfast blood glucose is taken between 7-8 am. Her breakfast blood glucose range is generally 90-110 mg/dl.   Concerned about possible thyroid disease Pt has noticed some hair loss and decreased hearing.  Her daughter has hashimoto's and wants her to be checked.  She denies constipation, lower extremity edema, or heat/cold intolerance.  She does have palpitations but complete cardiac work up was negative for ischemia.  Decreased hearing Patient has noticed some decreased hearing bilaterally despite hearing aids.  She thinks she may have wax buildup.  She has not been to ENT or had her hearing aids adjusted recently.  She has recovered from her URI last month.  She denies ear pain, drainage or tinnitus.  Review of Systems:  Review of Systems  Constitutional: Negative for fever, activity change, appetite change, fatigue and unexpected weight change.  HENT: Positive for hearing loss, postnasal drip and voice change. Negative for trouble swallowing.   Respiratory: Negative for chest tightness, shortness of breath and wheezing.   Cardiovascular: Positive for palpitations. Negative for chest pain and leg swelling.  Gastrointestinal: Negative for constipation and blood in stool.  Endocrine: Negative for polydipsia and polyuria.  Genitourinary: Negative for dysuria.  Musculoskeletal: Negative for gait problem.  Skin:  Negative for color change and rash.  Neurological: Negative for dizziness and numbness.  Psychiatric/Behavioral: Negative for dysphoric mood.    Patient Active Problem List   Diagnosis Date Noted  . Allergic rhinitis 10/31/2014  . Candida esophagitis 10/31/2014  . Controlled diabetes mellitus type II without complication 54/49/2010  . 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 Essential (primary) hypertension 10/31/2014  . 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 Gastrointestinal ulcer due to Helicobacter pylori 12/29/1973  . Has a tremor 10/31/2014  . Avitaminosis D 10/31/2014    Prior to Admission medications   Medication Sig Start Date End Date Taking? Authorizing Provider  ACCU-CHEK SOFTCLIX LANCETS lancets  09/04/14  Yes Historical Provider, MD  aspirin 81 MG tablet Take 81 mg by mouth every evening.   Yes Historical Provider, MD  Blood Glucose Monitoring Suppl (ACCU-CHEK AVIVA PLUS) W/DEVICE KIT U UTD 09/04/14  Yes Historical Provider, MD  cholecalciferol (VITAMIN D) 1000 UNITS tablet Take 1,000 Units by mouth daily.   Yes Historical Provider, MD  ferrous fumarate (HEMOCYTE - 106 MG FE) 325 (106 FE) MG TABS tablet Take 1 tablet by mouth daily.   Yes Historical Provider, MD  glipiZIDE (GLUCOTROL) 5 MG tablet Take 1 tablet (5 mg total) by mouth 2 (two) times daily before a meal. 12/12/14  Yes Glean Hess, MD  glucose blood test strip  09/04/14  Yes Historical Provider, MD  lisinopril (PRINIVIL,ZESTRIL) 5 MG tablet Take 1 tablet (5 mg total) by mouth daily. 12/12/14  Yes Glean Hess, MD  lovastatin (MEVACOR) 40 MG tablet Take 1 tablet (40 mg total) by mouth every evening. 12/12/14  Yes Glean Hess,  MD  metFORMIN (GLUCOPHAGE-XR) 750 MG 24 hr tablet Take 1 tablet (750 mg total) by mouth 2 (two) times daily. 12/12/14  Yes Glean Hess, MD  Multiple Vitamin (MULTIVITAMIN WITH MINERALS) TABS tablet Take  1 tablet by mouth daily.   Yes Historical Provider, MD  pantoprazole (PROTONIX) 40 MG tablet Take 1 tablet (40 mg total) by mouth daily. 11/04/14  Yes Lollie Sails, MD  vitamin C (ASCORBIC ACID) 500 MG tablet Take 500 mg by mouth daily.   Yes Historical Provider, MD    Allergies  Allergen Reactions  . Penicillins Swelling    Past Surgical History  Procedure Laterality Date  . Eye surgery Bilateral     cataracts  . Umbilical hernia repair    . Tonsillectomy    . Esophagogastroduodenoscopy  07/2014    grade D esophagitis; prepyloric ulcer    History  Substance Use Topics  . Smoking status: Never Smoker   . Smokeless tobacco: Never Used  . Alcohol Use: 0.0 oz/week    0 Standard drinks or equivalent per week     Comment: occasionally once a year   Lab Results  Component Value Date   HGBA1C 6.9* 08/22/2014     Medication list has been reviewed and updated.  Physical Examination:  Physical Exam  Constitutional: She is oriented to person, place, and time. She appears well-developed and well-nourished. No distress.  HENT:  Head: Normocephalic and atraumatic.  Right Ear: Tympanic membrane and ear canal normal. Decreased hearing is noted.  Left Ear: Tympanic membrane and ear canal normal. Decreased hearing is noted.  Mouth/Throat: Uvula is midline and oropharynx is clear and moist. No posterior oropharyngeal erythema.  Neck: No tracheal tenderness present. No thyroid mass and no thyromegaly present.  Cardiovascular: Normal rate and regular rhythm.  Frequent extrasystoles are present.  Pulses:      Dorsalis pedis pulses are 1+ on the right side, and 1+ on the left side.  Pulmonary/Chest: Effort normal and breath sounds normal. She has no wheezes. She has no rhonchi.  Neurological: She is alert and oriented to person, place, and time.  Skin: Skin is warm, dry and intact.  Normal age related hair thinning noted  Psychiatric: She has a normal mood and affect.    BP 124/70  mmHg  Pulse 76  Ht 5' 3.5" (1.613 m)  Wt 172 lb 9.6 oz (78.291 kg)  BMI 30.09 kg/m2  Assessment and Plan: 1. Controlled diabetes mellitus type II without complication - glipiZIDE (GLUCOTROL) 5 MG tablet; Take 1 tablet (5 mg total) by mouth 2 (two) times daily before a meal.  Dispense: 180 tablet; Refill: 3 - Hemoglobin A1c  2. Essential (primary) hypertension Controlled on current therapy; will check TSH - TSH  3. Hard of hearing, bilateral No cerumen on exam - recommend follow up for hearing aid adjustment   Halina Maidens, MD [facility]  01/14/2015

## 2015-01-15 LAB — HEMOGLOBIN A1C
Est. average glucose Bld gHb Est-mCnc: 131 mg/dL
Hgb A1c MFr Bld: 6.2 % — ABNORMAL HIGH (ref 4.8–5.6)

## 2015-01-15 LAB — TSH: TSH: 1.38 u[IU]/mL (ref 0.450–4.500)

## 2015-01-28 DIAGNOSIS — D509 Iron deficiency anemia, unspecified: Secondary | ICD-10-CM | POA: Diagnosis not present

## 2015-02-20 ENCOUNTER — Ambulatory Visit (INDEPENDENT_AMBULATORY_CARE_PROVIDER_SITE_OTHER): Payer: Medicare Other | Admitting: Internal Medicine

## 2015-02-20 ENCOUNTER — Encounter: Payer: Self-pay | Admitting: Internal Medicine

## 2015-02-20 VITALS — BP 158/80 | HR 64 | Ht 63.5 in | Wt 175.0 lb

## 2015-02-20 DIAGNOSIS — M7071 Other bursitis of hip, right hip: Secondary | ICD-10-CM

## 2015-02-20 MED ORDER — PREDNISONE 10 MG PO TABS: 40.0000 mg | ORAL_TABLET | Freq: Every day | ORAL | Status: DC

## 2015-02-20 NOTE — Patient Instructions (Signed)
Hip Bursitis Bursitis is a swelling and soreness (inflammation) of a fluid-filled sac (bursa). This sac overlies and protects the joints.  CAUSES   Injury.  Overuse of the muscles surrounding the joint.  Arthritis.  Gout.  Infection.  Cold weather.  Inadequate warm-up and conditioning prior to activities. The cause may not be known.  SYMPTOMS   Mild to severe irritation.  Tenderness and swelling over the outside of the hip.  Pain with motion of the hip.  If the bursa becomes infected, a fever may be present. Redness, tenderness, and warmth will develop over the hip. Symptoms usually lessen in 3 to 4 weeks with treatment, but can come back. TREATMENT If conservative treatment does not work, your caregiver may advise draining the bursa and injecting cortisone into the area. This may speed up the healing process. This may also be used as an initial treatment of choice. HOME CARE INSTRUCTIONS   Apply ice to the affected area for 15-20 minutes every 3 to 4 hours while awake for the first 2 days. Put the ice in a plastic bag and place a towel between the bag of ice and your skin.  Rest the painful joint as much as possible, but continue to put the joint through a normal range of motion at least 4 times per day. When the pain lessens, begin normal, slow movements and usual activities to help prevent stiffness of the hip.  Only take over-the-counter or prescription medicines for pain, discomfort, or fever as directed by your caregiver.  Use crutches to limit weight bearing on the hip joint, if advised.  Elevate your painful hip to reduce swelling. Use pillows for propping and cushioning your legs and hips.  Gentle massage may provide comfort and decrease swelling. SEEK IMMEDIATE MEDICAL CARE IF:   Your pain increases even during treatment, or you are not improving.  You have a fever.  You have heat and inflammation over the involved bursa.  You have any other questions or  concerns. MAKE SURE YOU:   Understand these instructions.  Will watch your condition.  Will get help right away if you are not doing well or get worse. Document Released: 11/26/2001 Document Revised: 08/29/2011 Document Reviewed: 06/25/2008 Exit[employer] Patient Information 2015 [employer]. This information is not intended to replace advice given to you by your health care provider. Make sure you discuss any questions you have with your health care provider.  

## 2015-02-20 NOTE — Progress Notes (Signed)
Date:  02/20/2015   Name:  Penny Andrews   DOB:  07-21-1924   MRN:  [id number]   Chief Complaint: Leg Pain  patient complains of right lateral hip and thigh discomfort. Onset was acutely 3 days ago. She noticed it initially while sleeping on that side. During the day the pain is not so severe she is able to walk and sit without much discomfort. She took one Bufferin which did not provide much relief. She has prohibited from taking NSAIDs and aspirin due to a recent GI bleed. She denies any recent falls and bruising or other trauma.  Review of Systems:  Review of Systems  Constitutional: Negative for fever and fatigue.  Musculoskeletal: Positive for arthralgias. Negative for joint swelling and gait problem.  Skin: Negative for color change, rash and wound.  Hematological: Does not bruise/bleed easily.    Patient Active Problem List   Diagnosis Date Noted  . Hard of hearing 01/14/2015  . Allergic rhinitis 10/31/2014  . Candida esophagitis 10/31/2014  . Controlled diabetes mellitus type II without complication 84/66/5993  . 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 Essential (primary) hypertension 10/31/2014  . 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 Gastrointestinal ulcer due to Helicobacter pylori 57/06/7791  . Has a tremor 10/31/2014  . Avitaminosis D 10/31/2014    Prior to Admission medications   Medication Sig Start Date End Date Taking? Authorizing Provider  ACCU-CHEK SOFTCLIX LANCETS lancets  09/04/14  Yes Historical Provider, MD  aspirin 81 MG tablet Take 81 mg by mouth every evening.   Yes Historical Provider, MD  Blood Glucose Monitoring Suppl (ACCU-CHEK AVIVA PLUS) W/DEVICE KIT U UTD 09/04/14  Yes Historical Provider, MD  cholecalciferol (VITAMIN D) 1000 UNITS tablet Take 1,000 Units by mouth daily.   Yes Historical Provider, MD  ferrous fumarate (HEMOCYTE - 106 MG FE) 325 (106 FE) MG TABS tablet Take  1 tablet by mouth daily.   Yes Historical Provider, MD  glipiZIDE (GLUCOTROL) 5 MG tablet Take 1 tablet (5 mg total) by mouth 2 (two) times daily before a meal. 01/14/15  Yes Glean Hess, MD  glucose blood test strip  09/04/14  Yes Historical Provider, MD  lisinopril (PRINIVIL,ZESTRIL) 5 MG tablet Take 1 tablet (5 mg total) by mouth daily. 12/12/14  Yes Glean Hess, MD  lovastatin (MEVACOR) 40 MG tablet Take 1 tablet (40 mg total) by mouth every evening. 12/12/14  Yes Glean Hess, MD  metFORMIN (GLUCOPHAGE-XR) 750 MG 24 hr tablet Take 1 tablet (750 mg total) by mouth 2 (two) times daily. 12/12/14  Yes Glean Hess, MD  Multiple Vitamin (MULTIVITAMIN WITH MINERALS) TABS tablet Take 1 tablet by mouth daily.   Yes Historical Provider, MD  pantoprazole (PROTONIX) 40 MG tablet Take 1 tablet (40 mg total) by mouth daily. 11/04/14  Yes Lollie Sails, MD  vitamin C (ASCORBIC ACID) 500 MG tablet Take 500 mg by mouth daily.   Yes Historical Provider, MD    Allergies  Allergen Reactions  . Penicillins Swelling    Past Surgical History  Procedure Laterality Date  . Eye surgery Bilateral     cataracts  . Umbilical hernia repair    . Tonsillectomy    . Esophagogastroduodenoscopy  07/2014    grade D esophagitis; prepyloric ulcer    Social History  Substance Use Topics  . Smoking status: Never Smoker   . Smokeless tobacco: Never Used  . Alcohol Use:  0.0 oz/week    0 Standard drinks or equivalent per week     Comment: occasionally once a year     Medication list has been reviewed and updated.  Physical Examination:  Physical Exam  Constitutional: She is oriented to person, place, and time. She appears well-developed. No distress.  HENT:  Head: Normocephalic and atraumatic.  Eyes: Conjunctivae are normal. Right eye exhibits no discharge. Left eye exhibits no discharge. No scleral icterus.  Pulmonary/Chest: Effort normal. No respiratory distress.  Musculoskeletal: Normal  range of motion.  Tenderness along the right hip bursa. Superficial veins are noted which are nontender and noninflamed. Range of motion of the right hip is intact. Right lower leg shows no edema or calf tenderness.  Neurological: She is alert and oriented to person, place, and time.  Skin: Skin is warm, dry and intact. No bruising, no ecchymosis and no rash noted. No erythema.  Psychiatric: She has a normal mood and affect. Her behavior is normal. Thought content normal.    BP 158/80 mmHg  Pulse 64  Ht 5' 3.5" (1.613 m)  Wt 175 lb (79.379 kg)  BMI 30.51 kg/m2  Assessment and Plan: 1. Bursitis of hip, right Avoid all nonsteroidals and aspirin. May use topical rubs such as BenGay or icy hot. May also use ice or heat without topical rub if beneficial. - predniSONE (DELTASONE) 10 MG tablet; Take 4 tablets (40 mg total) by mouth daily with breakfast.  Dispense: 16 tablet; Refill: 0   Halina Maidens, MD [facility]  02/20/2015

## 2015-03-17 DIAGNOSIS — E782 Mixed hyperlipidemia: Secondary | ICD-10-CM | POA: Diagnosis not present

## 2015-03-17 DIAGNOSIS — I10 Essential (primary) hypertension: Secondary | ICD-10-CM | POA: Diagnosis not present

## 2015-03-17 DIAGNOSIS — R002 Palpitations: Secondary | ICD-10-CM | POA: Diagnosis not present

## 2015-03-31 DIAGNOSIS — D509 Iron deficiency anemia, unspecified: Secondary | ICD-10-CM | POA: Diagnosis not present

## 2015-04-01 DIAGNOSIS — D508 Other iron deficiency anemias: Secondary | ICD-10-CM | POA: Diagnosis not present

## 2015-04-02 DIAGNOSIS — Z23 Encounter for immunization: Secondary | ICD-10-CM | POA: Diagnosis not present

## 2015-07-27 DIAGNOSIS — D508 Other iron deficiency anemias: Secondary | ICD-10-CM | POA: Diagnosis not present

## 2015-07-29 DIAGNOSIS — D509 Iron deficiency anemia, unspecified: Secondary | ICD-10-CM | POA: Diagnosis not present

## 2015-09-17 DIAGNOSIS — R002 Palpitations: Secondary | ICD-10-CM | POA: Diagnosis not present

## 2015-09-17 DIAGNOSIS — E782 Mixed hyperlipidemia: Secondary | ICD-10-CM | POA: Diagnosis not present

## 2015-09-17 DIAGNOSIS — I10 Essential (primary) hypertension: Secondary | ICD-10-CM | POA: Diagnosis not present

## 2015-10-19 DIAGNOSIS — D649 Anemia, unspecified: Secondary | ICD-10-CM | POA: Diagnosis not present

## 2015-11-09 ENCOUNTER — Encounter: Payer: Self-pay | Admitting: Internal Medicine

## 2015-11-09 ENCOUNTER — Ambulatory Visit (INDEPENDENT_AMBULATORY_CARE_PROVIDER_SITE_OTHER): Payer: Medicare Other | Admitting: Internal Medicine

## 2015-11-09 VITALS — BP 122/62 | HR 91 | Temp 98.5°F | Resp 16 | Ht 63.5 in | Wt 181.2 lb

## 2015-11-09 DIAGNOSIS — J40 Bronchitis, not specified as acute or chronic: Secondary | ICD-10-CM | POA: Diagnosis not present

## 2015-11-09 DIAGNOSIS — E119 Type 2 diabetes mellitus without complications: Secondary | ICD-10-CM

## 2015-11-09 DIAGNOSIS — I10 Essential (primary) hypertension: Secondary | ICD-10-CM | POA: Diagnosis not present

## 2015-11-09 MED ORDER — DOXYCYCLINE HYCLATE 100 MG PO TABS: 100.0000 mg | ORAL_TABLET | Freq: Two times a day (BID) | ORAL | Status: DC

## 2015-11-09 NOTE — Progress Notes (Signed)
Date:  11/09/2015   Name:  Penny Andrews   DOB:  05/12/1925   MRN:  [id number]   Chief Complaint: Cough Cough This is a new problem. The current episode started in the past 7 days. The problem has been unchanged. The problem occurs every few minutes. The cough is non-productive. Associated symptoms include shortness of breath. Pertinent negatives include no chest pain, chills, fever, sore throat or wheezing. Nothing aggravates the symptoms. She has tried OTC cough suppressant for the symptoms. The treatment provided no relief.  Diabetes She presents for her follow-up diabetic visit. She has type 2 diabetes mellitus. Her disease course has been stable. Pertinent negatives for hypoglycemia include no dizziness. Pertinent negatives for diabetes include no chest pain and no fatigue. Current diabetic treatment includes oral agent (dual therapy). Her breakfast blood glucose is taken between 6-7 am. Her breakfast blood glucose range is generally 90-110 mg/dl.  Hypertension This is a chronic problem. The current episode started more than 1 year ago. The problem is unchanged. The problem is controlled. Associated symptoms include shortness of breath. Pertinent negatives include no chest pain or palpitations. Past treatments include ACE inhibitors.   Her daughter was recently treated for Mycoplasma Pneumonia bronchitis.   Review of Systems  Constitutional: Negative for fever, chills and fatigue.  HENT: Negative for sore throat.   Eyes: Negative for visual disturbance.  Respiratory: Positive for cough and shortness of breath. Negative for wheezing.   Cardiovascular: Negative for chest pain, palpitations and leg swelling.  Gastrointestinal: Negative for nausea, vomiting and abdominal pain.  Neurological: Negative for dizziness and light-headedness.  Psychiatric/Behavioral: Negative for dysphoric mood.    Patient Active Problem List   Diagnosis Date Noted  . Hard of hearing 01/14/2015  .  Allergic rhinitis 10/31/2014  . Candida esophagitis ([place]) 10/31/2014  . Controlled diabetes mellitus type II without complication ([place]) 63/14/9702  . 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 Essential (primary) hypertension 10/31/2014  . 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 Gastrointestinal ulcer due to Helicobacter pylori 63/78/5885  . Has a tremor 10/31/2014  . Avitaminosis D 10/31/2014  . Diabetes mellitus ([place]) 12/27/2013  . HLD (hyperlipidemia) 12/27/2013  . Encounter for other screening for malignant neoplasm of breast 07/24/2012  . Encounter for screening for other disorder 07/24/2012  . Type 2 diabetes mellitus ([place]) 07/24/2012    Prior to Admission medications   Medication Sig Start Date End Date Taking? Authorizing Provider  ACCU-CHEK SOFTCLIX LANCETS lancets  09/04/14  Yes Historical Provider, MD  aspirin 81 MG tablet Take 81 mg by mouth every evening.   Yes Historical Provider, MD  Blood Glucose Monitoring Suppl (ACCU-CHEK AVIVA PLUS) W/DEVICE KIT U UTD 09/04/14  Yes Historical Provider, MD  cholecalciferol (VITAMIN D) 1000 UNITS tablet Take 1,000 Units by mouth daily.   Yes Historical Provider, MD  ferrous fumarate (HEMOCYTE - 106 MG FE) 325 (106 FE) MG TABS tablet Take 1 tablet by mouth daily.   Yes Historical Provider, MD  glipiZIDE (GLUCOTROL) 5 MG tablet Take 1 tablet (5 mg total) by mouth 2 (two) times daily before a meal. 01/14/15  Yes Glean Hess, MD  glucose blood test strip  09/04/14  Yes Historical Provider, MD  lisinopril (PRINIVIL,ZESTRIL) 5 MG tablet Take 1 tablet (5 mg total) by mouth daily. 12/12/14  Yes Glean Hess, MD  lovastatin (MEVACOR) 40 MG tablet Take 1 tablet (40 mg total) by mouth every  evening. 12/12/14  Yes Glean Hess, MD  metFORMIN (GLUCOPHAGE-XR) 750 MG 24 hr tablet Take 1 tablet (750 mg total) by mouth 2 (two) times daily. 12/12/14  Yes Glean Hess, MD    Multiple Vitamin (MULTIVITAMIN WITH MINERALS) TABS tablet Take 1 tablet by mouth daily.   Yes Historical Provider, MD  pantoprazole (PROTONIX) 40 MG tablet Take 1 tablet (40 mg total) by mouth daily. 11/04/14  Yes Lollie Sails, MD  predniSONE (DELTASONE) 10 MG tablet Take 4 tablets (40 mg total) by mouth daily with breakfast. 02/20/15  Yes Glean Hess, MD  vitamin C (ASCORBIC ACID) 500 MG tablet Take by mouth.   Yes Historical Provider, MD    Allergies  Allergen Reactions  . Penicillins Swelling    Past Surgical History  Procedure Laterality Date  . Eye surgery Bilateral     cataracts  . Umbilical hernia repair    . Tonsillectomy    . Esophagogastroduodenoscopy  07/2014    grade D esophagitis; prepyloric ulcer    Social History  Substance Use Topics  . Smoking status: Never Smoker   . Smokeless tobacco: Never Used  . Alcohol Use: 0.0 oz/week    0 Standard drinks or equivalent per week     Comment: occasionally once a year    Medication list has been reviewed and updated.   Physical Exam  Constitutional: She is oriented to person, place, and time. She appears well-developed. No distress.  HENT:  Head: Normocephalic and atraumatic.  Neck: Normal range of motion. No thyromegaly present.  Cardiovascular: Normal rate, regular rhythm and normal heart sounds.   Pulmonary/Chest: Effort normal. No respiratory distress. She has decreased breath sounds in the right upper field and the left upper field. She has no wheezes. She has no rhonchi.  Musculoskeletal: Normal range of motion.  Lymphadenopathy:    She has no cervical adenopathy.  Neurological: She is alert and oriented to person, place, and time.  Skin: Skin is warm and dry. No rash noted.  Psychiatric: She has a normal mood and affect. Her behavior is normal. Thought content normal.  Nursing note and vitals reviewed.   BP 122/62 mmHg  Pulse 91  Temp(Src) 98.5 F (36.9 C) (Oral)  Resp 16  Ht 5' 3.5" (1.613 m)   Wt 181 lb 3.2 oz (82.192 kg)  BMI 31.59 kg/m2  SpO2 94%  Assessment and Plan: 1. Bronchitis Continue otc cough syrup if helpful - doxycycline (VIBRA-TABS) 100 MG tablet; Take 1 tablet (100 mg total) by mouth 2 (two) times daily.  Dispense: 20 tablet; Refill: 0  2. Essential (primary) hypertension controlled  3. Controlled type 2 diabetes mellitus without complication, without long-term current use of insulin ([place]) Continue oral agents - Hemoglobin W4X - Basic metabolic panel   Halina Maidens, MD [facility]  11/09/2015

## 2015-11-10 LAB — BASIC METABOLIC PANEL
BUN/Creatinine Ratio: 18 (ref 12–28)
BUN: 13 mg/dL (ref 8–27)
CO2: 26 mmol/L (ref 18–29)
CREATININE: 0.73 mg/dL (ref 0.57–1.00)
Calcium: 9.4 mg/dL (ref 8.7–10.3)
Chloride: 95 mmol/L — ABNORMAL LOW (ref 96–106)
GFR calc Af Amer: 85 mL/min/{1.73_m2} (ref >59)
GFR calc non Af Amer: 74 mL/min/{1.73_m2} (ref >59)
GLUCOSE: 138 mg/dL — AB (ref 65–99)
POTASSIUM: 4.9 mmol/L (ref 3.5–5.2)
SODIUM: 140 mmol/L (ref 134–144)

## 2015-11-10 LAB — HEMOGLOBIN A1C
Est. average glucose Bld gHb Est-mCnc: 160 mg/dL
HEMOGLOBIN A1C: 7.2 % — AB (ref 4.8–5.6)

## 2015-12-04 ENCOUNTER — Other Ambulatory Visit: Payer: Self-pay | Admitting: Internal Medicine

## 2015-12-29 DIAGNOSIS — D509 Iron deficiency anemia, unspecified: Secondary | ICD-10-CM | POA: Diagnosis not present

## 2015-12-30 DIAGNOSIS — D509 Iron deficiency anemia, unspecified: Secondary | ICD-10-CM | POA: Diagnosis not present

## 2016-03-10 DIAGNOSIS — R002 Palpitations: Secondary | ICD-10-CM | POA: Diagnosis not present

## 2016-03-10 DIAGNOSIS — E782 Mixed hyperlipidemia: Secondary | ICD-10-CM | POA: Diagnosis not present

## 2016-03-10 DIAGNOSIS — I10 Essential (primary) hypertension: Secondary | ICD-10-CM | POA: Diagnosis not present

## 2016-03-14 ENCOUNTER — Other Ambulatory Visit: Payer: Self-pay | Admitting: Internal Medicine

## 2016-03-14 DIAGNOSIS — Z23 Encounter for immunization: Secondary | ICD-10-CM | POA: Diagnosis not present

## 2016-03-14 DIAGNOSIS — E119 Type 2 diabetes mellitus without complications: Secondary | ICD-10-CM

## 2016-03-15 NOTE — Telephone Encounter (Signed)
pts coming in on 10/3

## 2016-03-22 ENCOUNTER — Ambulatory Visit (INDEPENDENT_AMBULATORY_CARE_PROVIDER_SITE_OTHER): Payer: Medicare Other | Admitting: Internal Medicine

## 2016-03-22 ENCOUNTER — Encounter: Payer: Self-pay | Admitting: Internal Medicine

## 2016-03-22 VITALS — BP 122/84 | HR 84 | Resp 16 | Ht 63.5 in | Wt 178.0 lb

## 2016-03-22 DIAGNOSIS — Z23 Encounter for immunization: Secondary | ICD-10-CM | POA: Diagnosis not present

## 2016-03-22 DIAGNOSIS — E1169 Type 2 diabetes mellitus with other specified complication: Secondary | ICD-10-CM | POA: Diagnosis not present

## 2016-03-22 DIAGNOSIS — I10 Essential (primary) hypertension: Secondary | ICD-10-CM

## 2016-03-22 DIAGNOSIS — E785 Hyperlipidemia, unspecified: Secondary | ICD-10-CM

## 2016-03-22 DIAGNOSIS — E119 Type 2 diabetes mellitus without complications: Secondary | ICD-10-CM

## 2016-03-22 NOTE — Progress Notes (Signed)
Date:  03/22/2016   Name:  Penny Andrews   DOB:  1924-08-06   MRN:  [id number]   Chief Complaint: Diabetes (BS 100-130) Diabetes  She presents for her follow-up diabetic visit. She has type 2 diabetes mellitus. Her disease course has been stable. Pertinent negatives for hypoglycemia include no headaches or tremors. Pertinent negatives for diabetes include no chest pain, no fatigue, no polydipsia and no polyuria. Symptoms are stable. Her weight is stable. Her breakfast blood glucose is taken between 6-7 am. Her breakfast blood glucose range is generally 110-130 mg/dl.  Hypertension  This is a chronic problem. The current episode started more than 1 year ago. The problem is unchanged. The problem is controlled. Pertinent negatives include no chest pain, headaches, palpitations or shortness of breath.  Hyperlipidemia  This is a chronic problem. The problem is controlled. Recent lipid tests were reviewed and are normal. Pertinent negatives include no chest pain or shortness of breath.   Lab Results  Component Value Date   HGBA1C 7.2 (H) 11/09/2015   No results found for: CHOL, HDL, LDLCALC, LDLDIRECT, TRIG, CHOLHDL     Review of Systems  Constitutional: Negative for appetite change, fatigue, fever and unexpected weight change.  HENT: Negative for tinnitus and trouble swallowing.   Eyes: Negative for visual disturbance.  Respiratory: Negative for cough, chest tightness and shortness of breath.   Cardiovascular: Negative for chest pain, palpitations and leg swelling.  Gastrointestinal: Negative for abdominal pain.  Endocrine: Negative for polydipsia and polyuria.  Genitourinary: Negative for dysuria and hematuria.  Musculoskeletal: Negative for arthralgias.  Neurological: Negative for tremors, numbness and headaches.  Psychiatric/Behavioral: Negative for dysphoric mood.    Patient Active Problem List   Diagnosis Date Noted  . Hard of hearing 01/14/2015  . Allergic rhinitis  10/31/2014  . Candida esophagitis ([place]) 10/31/2014  . Controlled diabetes mellitus type II without complication ([place]) 04/22/1593  . 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 Essential (primary) hypertension 10/31/2014  . 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 Hyperlipidemia associated with type 2 diabetes mellitus ([place]) 10/31/2014  . Gastrointestinal ulcer due to Helicobacter pylori 58/59/2924  . Has a tremor 10/31/2014  . Avitaminosis D 10/31/2014  . Encounter for other screening for malignant neoplasm of breast 07/24/2012  . Encounter for screening for other disorder 07/24/2012    Prior to Admission medications   Medication Sig Start Date End Date Taking? Authorizing Provider  ACCU-CHEK SOFTCLIX LANCETS lancets  09/04/14  Yes Historical Provider, MD  aspirin 81 MG tablet Take 81 mg by mouth every evening.   Yes Historical Provider, MD  Blood Glucose Monitoring Suppl (ACCU-CHEK AVIVA PLUS) W/DEVICE KIT U UTD 09/04/14  Yes Historical Provider, MD  cholecalciferol (VITAMIN D) 1000 UNITS tablet Take 1,000 Units by mouth daily.   Yes Historical Provider, MD  ferrous fumarate (HEMOCYTE - 106 MG FE) 325 (106 FE) MG TABS tablet Take 1 tablet by mouth daily.   Yes Historical Provider, MD  glipiZIDE (GLUCOTROL) 5 MG tablet TAKE 1 TABLET(5 MG) BY MOUTH TWICE DAILY BEFORE A MEAL 03/14/16  Yes Glean Hess, MD  glucose blood test strip  09/04/14  Yes Historical Provider, MD  lisinopril (PRINIVIL,ZESTRIL) 5 MG tablet TAKE 1 TABLET BY MOUTH DAILY 12/04/15  Yes Glean Hess, MD  lovastatin (MEVACOR) 40 MG tablet TAKE 1 TABLET BY MOUTH EVERY EVENING 12/04/15  Yes Glean Hess, MD  metFORMIN (GLUCOPHAGE-XR) 750 MG 24 hr tablet TAKE 1 TABLET BY MOUTH TWICE DAILY  12/04/15  Yes Glean Hess, MD  Multiple Vitamin (MULTIVITAMIN WITH MINERALS) TABS tablet Take 1 tablet by mouth daily.   Yes Historical Provider, MD  pantoprazole (PROTONIX) 40 MG tablet Take 1 tablet (40 mg total) by mouth  daily. 11/04/14  Yes Lollie Sails, MD  vitamin C (ASCORBIC ACID) 500 MG tablet Take by mouth.   Yes Historical Provider, MD    Allergies  Allergen Reactions  . Penicillins Swelling    Past Surgical History:  Procedure Laterality Date  . ESOPHAGOGASTRODUODENOSCOPY  07/2014   grade D esophagitis; prepyloric ulcer  . EYE SURGERY Bilateral    cataracts  . TONSILLECTOMY    . UMBILICAL HERNIA REPAIR      Social History  Substance Use Topics  . Smoking status: Never Smoker  . Smokeless tobacco: Never Used  . Alcohol use 0.0 oz/week     Comment: occasionally once a year     Medication list has been reviewed and updated.   Physical Exam  Constitutional: She is oriented to person, place, and time. She appears well-developed. No distress.  HENT:  Head: Normocephalic and atraumatic.  Neck: Normal range of motion. Carotid bruit is not present.  Cardiovascular: Normal rate, regular rhythm and normal heart sounds.   Pulmonary/Chest: Effort normal and breath sounds normal. No respiratory distress. She has no decreased breath sounds. She has no wheezes.  Musculoskeletal: Normal range of motion. She exhibits no edema or tenderness.  Neurological: She is alert and oriented to person, place, and time.  Foot exam - normal skin, nails, pulses and sensation bilaterally   Skin: Skin is warm and dry. No rash noted.  Psychiatric: She has a normal mood and affect. Her behavior is normal. Thought content normal.  Nursing note and vitals reviewed.   BP 122/84   Pulse 84   Resp 16   Ht 5' 3.5" (1.613 m)   Wt 178 lb (80.7 kg)   SpO2 98%   BMI 31.04 kg/m   Assessment and Plan: 1. Controlled type 2 diabetes mellitus without complication, without long-term current use of insulin ([place]) Doing well - continue current therapy - Comprehensive metabolic panel - Hemoglobin A1c  2. Essential (primary) hypertension stable - TSH  3. Hyperlipidemia associated with type 2 diabetes mellitus  (HCC) No recent lipid or liver panel - Lipid panel  4. Need for pneumococcal vaccination - Pneumococcal conjugate vaccine 13-valent IM   Halina Maidens, MD [facility]  03/22/2016

## 2016-03-22 NOTE — Patient Instructions (Addendum)
Pneumococcal Conjugate Vaccine (PCV13)   1. Why get vaccinated?  Vaccination can protect both children and adults from pneumococcal disease.  Pneumococcal disease is caused by bacteria that can spread from person to person through close contact. It can cause ear infections, and it can also lead to more serious infections of the:  · Lungs (pneumonia),  · Blood (bacteremia), and  · Covering of the brain and spinal cord (meningitis).  Pneumococcal pneumonia is most common among adults. Pneumococcal meningitis can cause deafness and brain damage, and it kills about 1 child in 10 who get it.  Anyone can get pneumococcal disease, but children under 2 years of age and adults 65 years and older, people with certain medical conditions, and cigarette smokers are at the highest risk.  Before there was a vaccine, the [place] saw:  · more than 700 cases of meningitis,  · about 13,000 blood infections,  · about 5 million ear infections, and  · about 200 deaths  in children under 5 each year from pneumococcal disease. Since vaccine became available, severe pneumococcal disease in these children has fallen by 88%.  About 18,000 older adults die of pneumococcal disease each year in the [place].  Treatment of pneumococcal infections with penicillin and other drugs is not as effective as it used to be, because some strains of the disease have become resistant to these drugs. This makes prevention of the disease, through vaccination, even more important.  2. PCV13 vaccine  Pneumococcal conjugate vaccine (called PCV13) protects against 13 types of pneumococcal bacteria.  PCV13 is routinely given to children at 2, 4, 6, and 12-15 months of age. It is also recommended for children and adults 2 to 64 years of age with certain health conditions, and for all adults 65 years of age and older. Your doctor can give you details.  3. Some people should not get this vaccine  Anyone who has ever had a life-threatening allergic reaction  to a dose of this vaccine, to an earlier pneumococcal vaccine called PCV7, or to any vaccine containing diphtheria toxoid (for example, DTaP), should not get PCV13.  Anyone with a severe allergy to any component of PCV13 should not get the vaccine. Tell your doctor if the person being vaccinated has any severe allergies.  If the person scheduled for vaccination is not feeling well, your healthcare provider might decide to reschedule the shot on another day.  4. Risks of a vaccine reaction  With any medicine, including vaccines, there is a chance of reactions. These are usually mild and go away on their own, but serious reactions are also possible.  Problems reported following PCV13 varied by age and dose in the series. The most common problems reported among children were:  · About half became drowsy after the shot, had a temporary loss of appetite, or had redness or tenderness where the shot was given.  · About 1 out of 3 had swelling where the shot was given.  · About 1 out of 3 had a mild fever, and about 1 in 20 had a fever over 102.2°F.  · Up to about 8 out of 10 became fussy or irritable.  Adults have reported pain, redness, and swelling where the shot was given; also mild fever, fatigue, headache, chills, or muscle pain.  Young children who get PCV13 along with inactivated flu vaccine at the same time may be at increased risk for seizures caused by fever. Ask your doctor for more information.  Problems that   could happen after any vaccine:  · People sometimes faint after a medical procedure, including vaccination. Sitting or lying down for about 15 minutes can help prevent fainting, and injuries caused by a fall. Tell your doctor if you feel dizzy, or have vision changes or ringing in the ears.  · Some older children and adults get severe pain in the shoulder and have difficulty moving the arm where a shot was given. This happens very rarely.  · Any medication can cause a severe allergic reaction. Such  reactions from a vaccine are very rare, estimated at about 1 in a million doses, and would happen within a few minutes to a few hours after the vaccination.  As with any medicine, there is a very small chance of a vaccine causing a serious injury or death.  The safety of vaccines is always being monitored. For more information, visit: www.cdc.gov/vaccinesafety/  5. What if there is a serious reaction?  What should I look for?  · Look for anything that concerns you, such as signs of a severe allergic reaction, very high fever, or unusual behavior.  Signs of a severe allergic reaction can include hives, swelling of the face and throat, difficulty breathing, a fast heartbeat, dizziness, and weakness-usually within a few minutes to a few hours after the vaccination.  What should I do?  · If you think it is a severe allergic reaction or other emergency that can't wait, call 9-1-1 or get the person to the nearest hospital. Otherwise, call your doctor.  Reactions should be reported to the Vaccine Adverse Event Reporting System (VAERS). Your doctor should file this report, or you can do it yourself through the VAERS web site at www.vaers.hhs.gov, or by calling 1-800-822-7967.  VAERS does not give medical advice.  6. The National Vaccine Injury Compensation Program  The National Vaccine Injury Compensation Program (VICP) is a federal program that was created to compensate people who may have been injured by certain vaccines.  Persons who believe they may have been injured by a vaccine can learn about the program and about filing a claim by calling 1-800-338-2382 or visiting the VICP website at www.hrsa.gov/vaccinecompensation. There is a time limit to file a claim for compensation.  7. How can I learn more?  · Ask your healthcare provider. He or she can give you the vaccine package insert or suggest other sources of information.  · Call your local or state health department.  · Contact the Centers for Disease Control and  Prevention (CDC):    Call 1-800-232-4636 (1-800-CDC-INFO) or    Visit CDC's website at www.cdc.gov/vaccines  Vaccine Information Statement  PCV13 Vaccine (04/24/2014)     This information is not intended to replace advice given to you by your health care provider. Make sure you discuss any questions you have with your health care provider.     Document Released: 04/03/2006 Document Revised: 06/27/2014 Document Reviewed: 05/01/2014  Elsevier Interactive Patient Education ©2016 Elsevier Inc.

## 2016-03-23 LAB — COMPREHENSIVE METABOLIC PANEL
A/G RATIO: 2.1 (ref 1.2–2.2)
ALBUMIN: 4.8 g/dL — AB (ref 3.5–4.7)
ALT: 20 IU/L (ref 0–32)
AST: 24 IU/L (ref 0–40)
Alkaline Phosphatase: 71 IU/L (ref 39–117)
BUN / CREAT RATIO: 20 (ref 12–28)
BUN: 15 mg/dL (ref 8–27)
Bilirubin Total: 0.3 mg/dL (ref 0.0–1.2)
CALCIUM: 9.4 mg/dL (ref 8.7–10.3)
CO2: 26 mmol/L (ref 18–29)
CREATININE: 0.76 mg/dL (ref 0.57–1.00)
Chloride: 102 mmol/L (ref 96–106)
GFR calc Af Amer: 81 mL/min/{1.73_m2} (ref >59)
GFR, EST NON AFRICAN AMERICAN: 70 mL/min/{1.73_m2} (ref >59)
GLOBULIN, TOTAL: 2.3 g/dL (ref 1.5–4.5)
Glucose: 65 mg/dL (ref 65–99)
POTASSIUM: 4.7 mmol/L (ref 3.5–5.2)
SODIUM: 143 mmol/L (ref 134–144)
Total Protein: 7.1 g/dL (ref 6.0–8.5)

## 2016-03-23 LAB — LIPID PANEL
CHOLESTEROL TOTAL: 155 mg/dL (ref 100–199)
Chol/HDL Ratio: 3.4 ratio units (ref 0.0–4.4)
HDL: 45 mg/dL (ref >39)
LDL Calculated: 89 mg/dL (ref 0–99)
Triglycerides: 105 mg/dL (ref 0–149)
VLDL CHOLESTEROL CAL: 21 mg/dL (ref 5–40)

## 2016-03-23 LAB — TSH: TSH: 1.48 u[IU]/mL (ref 0.450–4.500)

## 2016-03-23 LAB — HEMOGLOBIN A1C
ESTIMATED AVERAGE GLUCOSE: 134 mg/dL
Hgb A1c MFr Bld: 6.3 % — ABNORMAL HIGH (ref 4.8–5.6)

## 2016-06-08 ENCOUNTER — Other Ambulatory Visit: Payer: Self-pay | Admitting: Internal Medicine

## 2016-06-08 DIAGNOSIS — E119 Type 2 diabetes mellitus without complications: Secondary | ICD-10-CM

## 2016-07-11 ENCOUNTER — Other Ambulatory Visit: Payer: Self-pay | Admitting: Internal Medicine

## 2016-08-12 ENCOUNTER — Ambulatory Visit (INDEPENDENT_AMBULATORY_CARE_PROVIDER_SITE_OTHER): Payer: Medicare Other | Admitting: Internal Medicine

## 2016-08-12 ENCOUNTER — Encounter: Payer: Self-pay | Admitting: Internal Medicine

## 2016-08-12 VITALS — BP 136/60 | HR 92 | Temp 97.7°F | Ht 63.5 in | Wt 175.0 lb

## 2016-08-12 DIAGNOSIS — J40 Bronchitis, not specified as acute or chronic: Secondary | ICD-10-CM

## 2016-08-12 MED ORDER — LEVOFLOXACIN 500 MG PO TABS: 500.0000 mg | ORAL_TABLET | Freq: Every day | ORAL | 0 refills | Status: DC

## 2016-08-12 NOTE — Progress Notes (Signed)
  Date:  08/12/2016   Name:  Penny Andrews   DOB:  11/29/1924   MRN:  [id number]   Chief Complaint: Cough (X 2 weeks. No production. Feels like can't get chest congestion out. Daughter had walking pnemonia. ) Cough  This is a new problem. The current episode started in the past 7 days. The problem has been gradually worsening. The problem occurs every few minutes. The cough is non-productive. Associated symptoms include postnasal drip. Pertinent negatives include no chest pain, chills, ear pain, fever, headaches, sore throat, shortness of breath or wheezing. Risk factors for lung disease include travel. She has tried nothing for the symptoms.      Review of Systems  Constitutional: Negative for chills, fatigue and fever.  HENT: Positive for postnasal drip. Negative for ear pain, sore throat and trouble swallowing.   Respiratory: Positive for cough. Negative for chest tightness, shortness of breath and wheezing.   Cardiovascular: Negative for chest pain and palpitations.  Neurological: Negative for dizziness and headaches.  Psychiatric/Behavioral: Negative for sleep disturbance.    Patient Active Problem List   Diagnosis Date Noted  . Hard of hearing 01/14/2015  . Allergic rhinitis 10/31/2014  . Candida esophagitis ([facility]) 10/31/2014  . Controlled diabetes mellitus type II without complication ([facility]) 10/31/2014  . 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 Essential (primary) hypertension 10/31/2014  . 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 Hyperlipidemia associated with type 2 diabetes mellitus ([facility]) 10/31/2014  . Gastrointestinal ulcer due to Helicobacter pylori 10/31/2014  . Has a tremor 10/31/2014  . Avitaminosis D 10/31/2014  . Encounter for other screening for malignant neoplasm of breast 07/24/2012  . Encounter for screening for other disorder 07/24/2012    Prior to Admission medications   Medication Sig Start Date End Date Taking? Authorizing Provider    ACCU-CHEK SOFTCLIX LANCETS lancets  09/04/14  Yes Historical Provider, MD  aspirin 81 MG tablet Take 81 mg by mouth every evening.   Yes Historical Provider, MD  Blood Glucose Monitoring Suppl (ACCU-CHEK AVIVA PLUS) W/DEVICE KIT U UTD 09/04/14  Yes Historical Provider, MD  cholecalciferol (VITAMIN D) 1000 UNITS tablet Take 1,000 Units by mouth daily.   Yes Historical Provider, MD  ferrous fumarate (HEMOCYTE - 106 MG FE) 325 (106 FE) MG TABS tablet Take 1 tablet by mouth daily.   Yes Historical Provider, MD  glipiZIDE (GLUCOTROL) 5 MG tablet TAKE 1 TABLET(5 MG) BY MOUTH TWICE DAILY BEFORE A MEAL 06/08/16  Yes  H , MD  glucose blood test strip  09/04/14  Yes Historical Provider, MD  lisinopril (PRINIVIL,ZESTRIL) 5 MG tablet TAKE 1 TABLET BY MOUTH DAILY 06/08/16  Yes  H , MD  lovastatin (MEVACOR) 40 MG tablet TAKE 1 TABLET BY MOUTH EVERY EVENING 12/04/15  Yes  H , MD  metFORMIN (GLUCOPHAGE-XR) 750 MG 24 hr tablet TAKE 1 TABLET BY MOUTH TWICE DAILY 07/11/16  Yes  H , MD  Multiple Vitamin (MULTIVITAMIN WITH MINERALS) TABS tablet Take 1 tablet by mouth daily.   Yes Historical Provider, MD  pantoprazole (PROTONIX) 40 MG tablet Take 1 tablet (40 mg total) by mouth daily. 11/04/14  Yes Martin U Skulskie, MD  vitamin C (ASCORBIC ACID) 500 MG tablet Take by mouth.   Yes Historical Provider, MD    Allergies  Allergen Reactions  . Penicillins Swelling    Past Surgical History:  Procedure Laterality Date  . ESOPHAGOGASTRODUODENOSCOPY  07/2014   grade D esophagitis; prepyloric ulcer  . EYE SURGERY Bilateral      cataracts  . TONSILLECTOMY    . UMBILICAL HERNIA REPAIR      Social History  Substance Use Topics  . Smoking status: Never Smoker  . Smokeless tobacco: Never Used  . Alcohol use 0.0 oz/week     Comment: occasionally once a year     Medication list has been reviewed and updated.   Physical Exam  Constitutional: She is oriented to person,  place, and time. She appears well-developed. No distress.  HENT:  Head: Normocephalic and atraumatic.  Right Ear: Tympanic membrane and ear canal normal.  Left Ear: Tympanic membrane and ear canal normal.  Nose: Right sinus exhibits no maxillary sinus tenderness and no frontal sinus tenderness. Left sinus exhibits no maxillary sinus tenderness and no frontal sinus tenderness.  Mouth/Throat: No posterior oropharyngeal erythema.  Cardiovascular: Normal rate, regular rhythm and normal heart sounds.   Pulmonary/Chest: Effort normal. No accessory muscle usage. No respiratory distress. She has no decreased breath sounds. She has no wheezes.  Loose cough noted during exam  Musculoskeletal: Normal range of motion.  Neurological: She is alert and oriented to person, place, and time.  Skin: Skin is warm and dry. No rash noted.  Psychiatric: She has a normal mood and affect. Her behavior is normal. Thought content normal.  Nursing note and vitals reviewed.   BP 136/60   Pulse 92   Temp 97.7 F (36.5 C)   Ht 5' 3.5" (1.613 m)   Wt 175 lb (79.4 kg)   SpO2 99%   BMI 30.51 kg/m   Assessment and Plan: 1. Bronchitis Begin Mucinex plain bid Follow up if not improving   Meds ordered this encounter  Medications  . levofloxacin (LEVAQUIN) 500 MG tablet    Sig: Take 1 tablet (500 mg total) by mouth daily.    Dispense:  7 tablet    Refill:  0     , MD [facility]  08/12/2016  

## 2016-08-12 NOTE — Patient Instructions (Addendum)
Mucinex (plain) - take twice a day with 8 oz liquid  Acute Bronchitis, Adult Acute bronchitis is sudden (acute) swelling of the air tubes (bronchi) in the lungs. Acute bronchitis causes these tubes to fill with mucus, which can make it hard to breathe. It can also cause coughing or wheezing. In adults, acute bronchitis usually goes away within 2 weeks. A cough caused by bronchitis may last up to 3 weeks. Smoking, allergies, and asthma can make the condition worse. Repeated episodes of bronchitis may cause further lung problems, such as chronic obstructive pulmonary disease (COPD). What are the causes? This condition can be caused by germs and by substances that irritate the lungs, including:  Cold and flu viruses. This condition is most often caused by the same virus that causes a cold.  Bacteria.  Exposure to tobacco smoke, dust, fumes, and air pollution. What increases the risk? This condition is more likely to develop in people who:  Have close contact with someone with acute bronchitis.  Are exposed to lung irritants, such as tobacco smoke, dust, fumes, and vapors.  Have a weak immune system.  Have a respiratory condition such as asthma. What are the signs or symptoms? Symptoms of this condition include:  A cough.  Coughing up clear, yellow, or green mucus.  Wheezing.  Chest congestion.  Shortness of breath.  A fever.  Body aches.  Chills.  A sore throat. How is this diagnosed? This condition is usually diagnosed with a physical exam. During the exam, your health care provider may order tests, such as chest X-rays, to rule out other conditions. He or she may also:  Test a sample of your mucus for bacterial infection.  Check the level of oxygen in your blood. This is done to check for pneumonia.  Do a chest X-ray or lung function testing to rule out pneumonia and other conditions.  Perform blood tests. Your health care provider will also ask about your symptoms  and medical history. How is this treated? Most cases of acute bronchitis clear up over time without treatment. Your health care provider may recommend:  Drinking more fluids. Drinking more makes your mucus thinner, which may make it easier to breathe.  Taking a medicine for a fever or cough.  Taking an antibiotic medicine.  Using an inhaler to help improve shortness of breath and to control a cough.  Using a cool mist vaporizer or humidifier to make it easier to breathe. Follow these instructions at home: Medicines  Take over-the-counter and prescription medicines only as told by your health care provider.  If you were prescribed an antibiotic, take it as told by your health care provider. Do not stop taking the antibiotic even if you start to feel better. General instructions  Get plenty of rest.  Drink enough fluids to keep your urine clear or pale yellow.  Avoid smoking and secondhand smoke. Exposure to cigarette smoke or irritating chemicals will make bronchitis worse. If you smoke and you need help quitting, ask your health care provider. Quitting smoking will help your lungs heal faster.  Use an inhaler, cool mist vaporizer, or humidifier as told by your health care provider.  Keep all follow-up visits as told by your health care provider. This is important. How is this prevented? To lower your risk of getting this condition again:  Wash your hands often with soap and water. If soap and water are not available, use hand sanitizer.  Avoid contact with people who have cold symptoms.  Try  not to touch your hands to your mouth, nose, or eyes.  Make sure to get the flu shot every year. Contact a health care provider if:  Your symptoms do not improve in 2 weeks of treatment. Get help right away if:  You cough up blood.  You have chest pain.  You have severe shortness of breath.  You become dehydrated.  You faint or keep feeling like you are going to faint.  You  keep vomiting.  You have a severe headache.  Your fever or chills gets worse. This information is not intended to replace advice given to you by your health care provider. Make sure you discuss any questions you have with your health care provider. Document Released: 07/14/2004 Document Revised: 12/30/2015 Document Reviewed: 11/25/2015 Elsevier Interactive Patient Education  2017 [employer].

## 2016-08-30 DIAGNOSIS — I10 Essential (primary) hypertension: Secondary | ICD-10-CM | POA: Diagnosis not present

## 2016-08-30 DIAGNOSIS — E782 Mixed hyperlipidemia: Secondary | ICD-10-CM | POA: Diagnosis not present

## 2016-09-20 ENCOUNTER — Ambulatory Visit (INDEPENDENT_AMBULATORY_CARE_PROVIDER_SITE_OTHER): Payer: Medicare Other | Admitting: Internal Medicine

## 2016-09-20 ENCOUNTER — Encounter: Payer: Self-pay | Admitting: Internal Medicine

## 2016-09-20 VITALS — BP 142/64 | HR 68 | Ht 63.5 in | Wt 179.0 lb

## 2016-09-20 DIAGNOSIS — E785 Hyperlipidemia, unspecified: Secondary | ICD-10-CM

## 2016-09-20 DIAGNOSIS — E119 Type 2 diabetes mellitus without complications: Secondary | ICD-10-CM | POA: Diagnosis not present

## 2016-09-20 DIAGNOSIS — E1169 Type 2 diabetes mellitus with other specified complication: Secondary | ICD-10-CM

## 2016-09-20 DIAGNOSIS — I10 Essential (primary) hypertension: Secondary | ICD-10-CM

## 2016-09-20 NOTE — Progress Notes (Signed)
Date:  09/20/2016   Name:  Penny Andrews   DOB:  02-23-1925   MRN:  [id number]   Chief Complaint: Diabetes (BS- 95 this morning. ) and Hypertension Diabetes  She presents for her follow-up diabetic visit. She has type 2 diabetes mellitus. Pertinent negatives for hypoglycemia include no headaches. Pertinent negatives for diabetes include no chest pain, no fatigue, no foot paresthesias, no polydipsia, no weakness and no weight loss. Symptoms are stable. Current diabetic treatment includes oral agent (dual therapy). She is compliant with treatment most of the time. She monitors blood glucose at home 1-2 x per day. Her breakfast blood glucose range is generally 90-110 mg/dl.  Hypertension  This is a chronic problem. The problem is unchanged. The problem is controlled. Pertinent negatives include no chest pain, headaches, palpitations or shortness of breath.  She noticed that she could not see at all in the dark movie theater this weekend.  She is getting an eye exam soon - is due anyway.  Otherwise her vision is good.  Lab Results  Component Value Date   HGBA1C 6.3 (H) 03/22/2016   Lab Results  Component Value Date   CHOL 155 03/22/2016   HDL 45 03/22/2016   LDLCALC 89 03/22/2016   TRIG 105 03/22/2016   CHOLHDL 3.4 03/22/2016   Lab Results  Component Value Date   CREATININE 0.76 03/22/2016     Review of Systems  Constitutional: Negative for chills, fatigue, fever and weight loss.  Eyes: Positive for visual disturbance.  Respiratory: Negative for choking, shortness of breath and wheezing.   Cardiovascular: Negative for chest pain, palpitations and leg swelling.  Endocrine: Negative for polydipsia.  Skin: Negative for color change and rash.  Neurological: Negative for weakness, numbness and headaches.  Psychiatric/Behavioral: Negative for dysphoric mood and sleep disturbance.    Patient Active Problem List   Diagnosis Date Noted  . Hard of hearing 01/14/2015  . Allergic  rhinitis 10/31/2014  . Controlled type 2 diabetes mellitus without complication, without long-term current use of insulin ([place]) 10/31/2014  . 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 Essential (primary) hypertension 10/31/2014  . 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 Hyperlipidemia associated with type 2 diabetes mellitus ([place]) 10/31/2014  . Gastrointestinal ulcer due to Helicobacter pylori 82/99/3716  . Has a tremor 10/31/2014  . Avitaminosis D 10/31/2014  . Encounter for other screening for malignant neoplasm of breast 07/24/2012    Prior to Admission medications   Medication Sig Start Date End Date Taking? Authorizing Provider  ACCU-CHEK SOFTCLIX LANCETS lancets  09/04/14  Yes Historical Provider, MD  aspirin 81 MG tablet Take 81 mg by mouth every evening.   Yes Historical Provider, MD  Blood Glucose Monitoring Suppl (ACCU-CHEK AVIVA PLUS) W/DEVICE KIT U UTD 09/04/14  Yes Historical Provider, MD  cholecalciferol (VITAMIN D) 1000 UNITS tablet Take 1,000 Units by mouth daily.   Yes Historical Provider, MD  ferrous fumarate (HEMOCYTE - 106 MG FE) 325 (106 FE) MG TABS tablet Take 1 tablet by mouth daily.   Yes Historical Provider, MD  glipiZIDE (GLUCOTROL) 5 MG tablet TAKE 1 TABLET(5 MG) BY MOUTH TWICE DAILY BEFORE A MEAL 06/08/16  Yes Glean Hess, MD  glucose blood test strip  09/04/14  Yes Historical Provider, MD  lisinopril (PRINIVIL,ZESTRIL) 5 MG tablet TAKE 1 TABLET BY MOUTH DAILY 06/08/16  Yes Glean Hess, MD  lovastatin (MEVACOR) 40 MG tablet TAKE 1 TABLET BY MOUTH EVERY EVENING 12/04/15  Yes Glean Hess,  MD  metFORMIN (GLUCOPHAGE-XR) 750 MG 24 hr tablet TAKE 1 TABLET BY MOUTH TWICE DAILY 07/11/16  Yes Glean Hess, MD  Multiple Vitamin (MULTIVITAMIN WITH MINERALS) TABS tablet Take 1 tablet by mouth daily.   Yes Historical Provider, MD  vitamin C (ASCORBIC ACID) 500 MG tablet Take by mouth.   Yes Historical Provider, MD    Allergies  Allergen Reactions    . Penicillins Swelling    Past Surgical History:  Procedure Laterality Date  . ESOPHAGOGASTRODUODENOSCOPY  07/2014   grade D esophagitis; prepyloric ulcer  . EYE SURGERY Bilateral    cataracts  . TONSILLECTOMY    . UMBILICAL HERNIA REPAIR      Social History  Substance Use Topics  . Smoking status: Never Smoker  . Smokeless tobacco: Never Used  . Alcohol use 0.0 oz/week     Comment: occasionally once a year     Medication list has been reviewed and updated.   Physical Exam  Constitutional: She is oriented to person, place, and time. She appears well-developed. No distress.  HENT:  Head: Normocephalic and atraumatic.  Neck: Normal range of motion. Neck supple. Carotid bruit is not present.  Cardiovascular: Normal rate, regular rhythm and normal heart sounds.   Pulmonary/Chest: Effort normal and breath sounds normal. No respiratory distress. She has no decreased breath sounds. She has no wheezes.  Musculoskeletal: Normal range of motion.  Neurological: She is alert and oriented to person, place, and time.  Skin: Skin is warm and dry. No rash noted.  Psychiatric: She has a normal mood and affect. Her speech is normal and behavior is normal. Thought content normal.  Nursing note and vitals reviewed.   BP (!) 142/64 (BP Location: Right Arm, Patient Position: Sitting, Cuff Size: Normal)   Pulse 68   Ht 5' 3.5" (1.613 m)   Wt 179 lb (81.2 kg)   SpO2 97%   BMI 31.21 kg/m   Assessment and Plan: 1. Essential (primary) hypertension controlled  2. Controlled type 2 diabetes mellitus without complication, without long-term current use of insulin ([place]) Doing well on oral agents Agree with Eye exam  - Hemoglobin A1c  3. Hyperlipidemia associated with type 2 diabetes mellitus ([place]) On statin therapy   No orders of the defined types were placed in this encounter.   Halina Maidens, MD [facility]  09/20/2016

## 2016-09-21 LAB — HEMOGLOBIN A1C
ESTIMATED AVERAGE GLUCOSE: 143 mg/dL
Hgb A1c MFr Bld: 6.6 % — ABNORMAL HIGH (ref 4.8–5.6)

## 2016-09-22 DIAGNOSIS — D509 Iron deficiency anemia, unspecified: Secondary | ICD-10-CM | POA: Diagnosis not present

## 2016-10-13 ENCOUNTER — Other Ambulatory Visit: Payer: Self-pay | Admitting: Internal Medicine

## 2016-10-18 DIAGNOSIS — H26493 Other secondary cataract, bilateral: Secondary | ICD-10-CM | POA: Diagnosis not present

## 2016-10-18 LAB — HM DIABETES EYE EXAM

## 2016-10-19 ENCOUNTER — Encounter: Payer: Self-pay | Admitting: Internal Medicine

## 2016-12-14 ENCOUNTER — Other Ambulatory Visit: Payer: Self-pay | Admitting: Internal Medicine

## 2016-12-14 DIAGNOSIS — E119 Type 2 diabetes mellitus without complications: Secondary | ICD-10-CM

## 2016-12-22 ENCOUNTER — Other Ambulatory Visit: Payer: Self-pay | Admitting: Internal Medicine

## 2016-12-28 DIAGNOSIS — D509 Iron deficiency anemia, unspecified: Secondary | ICD-10-CM | POA: Diagnosis not present

## 2017-03-23 ENCOUNTER — Ambulatory Visit (INDEPENDENT_AMBULATORY_CARE_PROVIDER_SITE_OTHER): Payer: Medicare Other | Admitting: Internal Medicine

## 2017-03-23 ENCOUNTER — Encounter: Payer: Self-pay | Admitting: Internal Medicine

## 2017-03-23 VITALS — BP 144/84 | HR 74 | Ht 63.5 in | Wt 182.0 lb

## 2017-03-23 DIAGNOSIS — E119 Type 2 diabetes mellitus without complications: Secondary | ICD-10-CM

## 2017-03-23 DIAGNOSIS — Z23 Encounter for immunization: Secondary | ICD-10-CM | POA: Diagnosis not present

## 2017-03-23 DIAGNOSIS — E1169 Type 2 diabetes mellitus with other specified complication: Secondary | ICD-10-CM

## 2017-03-23 DIAGNOSIS — Z0000 Encounter for general adult medical examination without abnormal findings: Secondary | ICD-10-CM

## 2017-03-23 DIAGNOSIS — D500 Iron deficiency anemia secondary to blood loss (chronic): Secondary | ICD-10-CM

## 2017-03-23 DIAGNOSIS — E559 Vitamin D deficiency, unspecified: Secondary | ICD-10-CM | POA: Diagnosis not present

## 2017-03-23 DIAGNOSIS — E785 Hyperlipidemia, unspecified: Secondary | ICD-10-CM

## 2017-03-23 DIAGNOSIS — I10 Essential (primary) hypertension: Secondary | ICD-10-CM | POA: Diagnosis not present

## 2017-03-23 NOTE — Patient Instructions (Signed)
Health Maintenance  Topic Date Due  . DEXA SCAN  04/29/1992  . INFLUENZA VACCINE  01/18/2017  . FOOT EXAM  03/22/2017  . HEMOGLOBIN A1C  03/22/2017  . OPHTHALMOLOGY EXAM  10/18/2017  . TETANUS/TDAP  06/28/2021  . PNA vac Low Risk Adult  Completed

## 2017-03-23 NOTE — Progress Notes (Signed)
Patient: Penny Andrews, Female    DOB: 1924/11/12, 83 y.o.   MRN: [id number] Visit Date: 03/23/2017  Today's Provider: Halina Maidens, MD   Chief Complaint  Patient presents with  . Medicare Wellness    Breast Exam.   . Diabetes  . Hyperlipidemia  . Immunizations    High Dose Flu Shot   Subjective:    Annual wellness visit Penny Andrews is a 83 y.o. female who presents today for her Subsequent Annual Wellness Visit. She feels well. She reports exercising walking. She reports she is sleeping well. She denies breast problems.  ----------------------------------------------------------- Diabetes  She presents for her follow-up diabetic visit. She has type 2 diabetes mellitus. Pertinent negatives for hypoglycemia include no dizziness, headaches, nervousness/anxiousness or tremors. Pertinent negatives for diabetes include no chest pain, no fatigue, no polydipsia and no polyuria.  Hyperlipidemia  This is a chronic problem. The problem is controlled. Pertinent negatives include no chest pain or shortness of breath. Current antihyperlipidemic treatment includes statins. The current treatment provides significant improvement of lipids.  Hypertension  This is a chronic problem. The problem is controlled. Pertinent negatives include no chest pain, headaches, palpitations or shortness of breath.  Gastroesophageal Reflux  She reports no abdominal pain, no chest pain, no choking, no coughing, no dysphagia, no globus sensation, no heartburn or no wheezing. The problem has been resolved. Pertinent negatives include no fatigue. She has tried a PPI (seen by GI - recommended continuing PPI but pt has no sx.  Originally started for a bleeding ulcer.) for the symptoms.    Review of Systems  Constitutional: Negative for chills, fatigue and fever.  HENT: Negative for congestion, hearing loss, tinnitus, trouble swallowing and voice change.   Eyes: Negative for visual disturbance.  Respiratory:  Negative for cough, choking, chest tightness, shortness of breath and wheezing.   Cardiovascular: Negative for chest pain, palpitations and leg swelling.  Gastrointestinal: Negative for abdominal pain, constipation, diarrhea, dysphagia, heartburn and vomiting.  Endocrine: Negative for polydipsia and polyuria.  Genitourinary: Negative for dysuria, frequency, genital sores, vaginal bleeding and vaginal discharge.  Musculoskeletal: Negative for arthralgias, gait problem and joint swelling.  Skin: Negative for color change and rash.  Neurological: Negative for dizziness, tremors, light-headedness and headaches.  Hematological: Negative for adenopathy. Does not bruise/bleed easily.  Psychiatric/Behavioral: Negative for dysphoric mood and sleep disturbance. The patient is not nervous/anxious.     Social History   Social History  . Marital status: Married    Spouse name: N/A  . Number of children: N/A  . Years of education: N/A   Occupational History  . Not on file.   Social History Main Topics  . Smoking status: Never Smoker  . Smokeless tobacco: Never Used  . Alcohol use 0.0 oz/week     Comment: occasionally once a year  . Drug use: No  . Sexual activity: Not on file   Other Topics Concern  . Not on file   Social History Narrative  . No narrative on file    Patient Active Problem List   Diagnosis Date Noted  . Hard of hearing 01/14/2015  . Allergic rhinitis 10/31/2014  . Controlled type 2 diabetes mellitus without complication, without long-term current use of insulin ([place]) 10/31/2014  . 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 Essential (primary) hypertension 10/31/2014  . 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 Hyperlipidemia associated with type 2 diabetes mellitus ([place]) 10/31/2014  . Gastrointestinal ulcer due to Helicobacter pylori 49/44/9675  . Has a  tremor 10/31/2014  . Avitaminosis D 10/31/2014  . Encounter for other screening for malignant neoplasm of  breast 07/24/2012    Past Surgical History:  Procedure Laterality Date  . ESOPHAGOGASTRODUODENOSCOPY  07/2014   grade D esophagitis; prepyloric ulcer  . EYE SURGERY Bilateral    cataracts  . TONSILLECTOMY    . UMBILICAL HERNIA REPAIR      Her family history includes CAD in her father; Cancer in her mother.     Previous Medications   ACCU-CHEK SOFTCLIX LANCETS LANCETS       ASPIRIN 81 MG TABLET    Take 81 mg by mouth every evening.   BLOOD GLUCOSE MONITORING SUPPL (ACCU-CHEK AVIVA PLUS) W/DEVICE KIT    U UTD   CHOLECALCIFEROL (VITAMIN D) 1000 UNITS TABLET    Take 1,000 Units by mouth daily.   FERROUS FUMARATE (HEMOCYTE - 106 MG FE) 325 (106 FE) MG TABS TABLET    Take 1 tablet by mouth daily.   GLIPIZIDE (GLUCOTROL) 5 MG TABLET    TAKE 1 TABLET(5 MG) BY MOUTH TWICE DAILY BEFORE A MEAL   GLUCOSE BLOOD TEST STRIP       LISINOPRIL (PRINIVIL,ZESTRIL) 5 MG TABLET    TAKE 1 TABLET BY MOUTH DAILY   LOVASTATIN (MEVACOR) 40 MG TABLET    TAKE 1 TABLET BY MOUTH EVERY EVENING   METFORMIN (GLUCOPHAGE-XR) 750 MG 24 HR TABLET    TAKE 1 TABLET BY MOUTH TWICE DAILY   MULTIPLE VITAMIN (MULTIVITAMIN WITH MINERALS) TABS TABLET    Take 1 tablet by mouth daily.   PANTOPRAZOLE (PROTONIX) 40 MG TABLET    Take 40 mg by mouth daily.   VITAMIN C (ASCORBIC ACID) 500 MG TABLET    Take by mouth.    Patient Care Team: Glean Hess, MD as PCP - General (Family Medicine) Lollie Sails, MD as Consulting Physician (Gastroenterology) Teodoro Spray, MD as Consulting Physician (Cardiology)      Objective:   Vitals: BP (!) 144/84   Pulse 74   Ht 5' 3.5" (1.613 m)   Wt 182 lb (82.6 kg)   SpO2 96%   BMI 31.73 kg/m   Physical Exam  Constitutional: She is oriented to person, place, and time. She appears well-developed and well-nourished. No distress.  HENT:  Head: Normocephalic and atraumatic.  Right Ear: Tympanic membrane and ear canal normal.  Left Ear: Tympanic membrane and ear canal normal.    Nose: Right sinus exhibits no maxillary sinus tenderness. Left sinus exhibits no maxillary sinus tenderness.  Mouth/Throat: Uvula is midline and oropharynx is clear and moist.  Eyes: Conjunctivae and EOM are normal. Right eye exhibits no discharge. Left eye exhibits no discharge. No scleral icterus.  Neck: Normal range of motion. Carotid bruit is not present. No erythema present. No thyromegaly present.  Cardiovascular: Normal rate, regular rhythm, normal heart sounds and normal pulses.   Pulmonary/Chest: Effort normal. No respiratory distress. She has no wheezes. Right breast exhibits no mass, no nipple discharge, no skin change and no tenderness. Left breast exhibits no mass, no nipple discharge, no skin change and no tenderness.  Abdominal: Soft. Bowel sounds are normal. There is no hepatosplenomegaly. There is no tenderness. There is no CVA tenderness.  Musculoskeletal: Normal range of motion.  Lymphadenopathy:    She has no cervical adenopathy.    She has no axillary adenopathy.  Neurological: She is alert and oriented to person, place, and time. She has normal reflexes. No cranial nerve deficit or sensory  deficit.  Skin: Skin is warm, dry and intact. No rash noted.  Psychiatric: She has a normal mood and affect. Her speech is normal and behavior is normal. Thought content normal.  Nursing note and vitals reviewed.   Activities of Daily Living In your present state of health, do you have any difficulty performing the following activities: 03/23/2017  Hearing? N  Vision? N  Difficulty concentrating or making decisions? N  Walking or climbing stairs? N  Dressing or bathing? N  Doing errands, shopping? N  Some recent data might be hidden    Fall Risk Assessment Fall Risk  03/23/2017 03/22/2016 11/09/2015 12/12/2014  Falls in the past year? No Yes No No  Number falls in past yr: - 1 - -  Injury with Fall? - Yes - -  Risk for fall due to : - Impaired balance/gait - -  Follow up - Falls  prevention discussed - -   6CIT Screen 03/23/2017  What Year? 0 points  What month? 0 points  What time? 0 points  Count back from 20 0 points  Months in reverse 0 points  Repeat phrase 4 points  Total Score 4     Depression Screen PHQ 2/9 Scores 03/23/2017 03/22/2016 11/09/2015 12/12/2014  PHQ - 2 Score 0 0 0 0    Medicare Annual Wellness Visit Summary:  Reviewed patient's Family Medical History Reviewed and updated list of patient's medical providers Assessment of cognitive impairment was done Assessed patient's functional ability Established a written schedule for health screening [place] Completed and Reviewed  Exercise Activities and Dietary recommendations Goals    None      Immunization History  Administered Date(s) Administered  . Influenza, High Dose Seasonal PF 03/23/2017  . Influenza-Unspecified 03/14/2016  . Pneumococcal Conjugate-13 07/29/2013, 03/22/2016  . Pneumococcal Polysaccharide-23 03/07/2006  . Pneumococcal-Unspecified 06/20/2012  . Tdap 06/29/2011  . Zoster 12/03/2006    Health Maintenance  Topic Date Due  . DEXA SCAN  04/29/1992  . INFLUENZA VACCINE  01/18/2017  . FOOT EXAM  03/22/2017  . HEMOGLOBIN A1C  03/22/2017  . OPHTHALMOLOGY EXAM  10/18/2017  . TETANUS/TDAP  06/28/2021  . PNA vac Low Risk Adult  Completed    Discussed health benefits of physical activity, and encouraged her to engage in regular exercise appropriate for her age and condition.    ------------------------------------------------------------------------------------------------------------  Assessment & Plan:  1. Medicare annual wellness visit, subsequent Measures satisfied  2. Essential (primary) hypertension controlled - CBC with Differential/Platelet - TSH  3. Hyperlipidemia associated with type 2 diabetes mellitus ([place]) On statin therapy - Lipid panel  4. Controlled type 2 diabetes mellitus without complication, without long-term  current use of insulin ([facility]) Continue oral agents - Comprehensive metabolic panel - Hemoglobin A1c  5. Avitaminosis D supplemented  6. Need for influenza vaccination - Flu vaccine HIGH DOSE PF (Fluzone High dose)  7. Iron deficiency anemia due to chronic blood loss Check labs and stop iron supplement if normalized and stop PPI after current supply exhausted - Fe+TIBC+Fer   No orders of the defined types were placed in this encounter.   Partially dictated using Editor, commissioning. Any errors are unintentional.  Halina Maidens, MD [facility]  03/23/2017

## 2017-03-24 LAB — COMPREHENSIVE METABOLIC PANEL
A/G RATIO: 1.8 (ref 1.2–2.2)
ALT: 26 IU/L (ref 0–32)
AST: 28 IU/L (ref 0–40)
Albumin: 4.5 g/dL (ref 3.5–4.7)
Alkaline Phosphatase: 78 IU/L (ref 39–117)
BILIRUBIN TOTAL: 0.4 mg/dL (ref 0.0–1.2)
BUN / CREAT RATIO: 15 (ref 12–28)
BUN: 12 mg/dL (ref 8–27)
CALCIUM: 10.2 mg/dL (ref 8.7–10.3)
CHLORIDE: 98 mmol/L (ref 96–106)
CO2: 25 mmol/L (ref 20–29)
Creatinine, Ser: 0.82 mg/dL (ref 0.57–1.00)
GFR, EST AFRICAN AMERICAN: 73 mL/min/{1.73_m2} (ref >59)
GFR, EST NON AFRICAN AMERICAN: 64 mL/min/{1.73_m2} (ref >59)
GLOBULIN, TOTAL: 2.5 g/dL (ref 1.5–4.5)
Glucose: 130 mg/dL — ABNORMAL HIGH (ref 65–99)
Potassium: 5.2 mmol/L (ref 3.5–5.2)
SODIUM: 139 mmol/L (ref 134–144)
TOTAL PROTEIN: 7 g/dL (ref 6.0–8.5)

## 2017-03-24 LAB — CBC WITH DIFFERENTIAL/PLATELET
Basophils Absolute: 0 10*3/uL (ref 0.0–0.2)
Basos: 1 %
EOS (ABSOLUTE): 0.3 10*3/uL (ref 0.0–0.4)
Eos: 4 %
Hematocrit: 44.1 % (ref 34.0–46.6)
Hemoglobin: 14.1 g/dL (ref 11.1–15.9)
Immature Grans (Abs): 0 10*3/uL (ref 0.0–0.1)
Immature Granulocytes: 0 %
Lymphocytes Absolute: 2.4 10*3/uL (ref 0.7–3.1)
Lymphs: 38 %
MCH: 28.1 pg (ref 26.6–33.0)
MCHC: 32 g/dL (ref 31.5–35.7)
MCV: 88 fL (ref 79–97)
Monocytes Absolute: 0.5 10*3/uL (ref 0.1–0.9)
Monocytes: 7 %
Neutrophils Absolute: 3.2 10*3/uL (ref 1.4–7.0)
Neutrophils: 50 %
Platelets: 207 10*3/uL (ref 150–379)
RBC: 5.02 x10E6/uL (ref 3.77–5.28)
RDW: 14.1 % (ref 12.3–15.4)
WBC: 6.3 10*3/uL (ref 3.4–10.8)

## 2017-03-24 LAB — HEMOGLOBIN A1C
Est. average glucose Bld gHb Est-mCnc: 146 mg/dL
Hgb A1c MFr Bld: 6.7 % — ABNORMAL HIGH (ref 4.8–5.6)

## 2017-03-24 LAB — IRON,TIBC AND FERRITIN PANEL
Ferritin: 44 ng/mL (ref 15–150)
Iron Saturation: 19 % (ref 15–55)
Iron: 76 ug/dL (ref 27–139)
Total Iron Binding Capacity: 408 ug/dL (ref 250–450)
UIBC: 332 ug/dL (ref 118–369)

## 2017-03-24 LAB — LIPID PANEL
Chol/HDL Ratio: 3.4 ratio (ref 0.0–4.4)
Cholesterol, Total: 172 mg/dL (ref 100–199)
HDL: 50 mg/dL (ref >39)
LDL Calculated: 86 mg/dL (ref 0–99)
Triglycerides: 182 mg/dL — ABNORMAL HIGH (ref 0–149)
VLDL Cholesterol Cal: 36 mg/dL (ref 5–40)

## 2017-03-24 LAB — TSH: TSH: 1.86 u[IU]/mL (ref 0.450–4.500)

## 2017-07-25 ENCOUNTER — Other Ambulatory Visit: Payer: Self-pay | Admitting: Internal Medicine

## 2017-09-21 ENCOUNTER — Encounter: Payer: Self-pay | Admitting: Internal Medicine

## 2017-09-21 ENCOUNTER — Ambulatory Visit (INDEPENDENT_AMBULATORY_CARE_PROVIDER_SITE_OTHER): Payer: Medicare Other | Admitting: Internal Medicine

## 2017-09-21 VITALS — BP 128/80 | HR 75 | Ht 63.5 in | Wt 178.0 lb

## 2017-09-21 DIAGNOSIS — E785 Hyperlipidemia, unspecified: Secondary | ICD-10-CM | POA: Diagnosis not present

## 2017-09-21 DIAGNOSIS — E1169 Type 2 diabetes mellitus with other specified complication: Secondary | ICD-10-CM | POA: Diagnosis not present

## 2017-09-21 DIAGNOSIS — E119 Type 2 diabetes mellitus without complications: Secondary | ICD-10-CM

## 2017-09-21 DIAGNOSIS — B9681 Helicobacter pylori [H. pylori] as the cause of diseases classified elsewhere: Secondary | ICD-10-CM | POA: Diagnosis not present

## 2017-09-21 DIAGNOSIS — K289 Gastrojejunal ulcer, unspecified as acute or chronic, without hemorrhage or perforation: Secondary | ICD-10-CM | POA: Diagnosis not present

## 2017-09-21 DIAGNOSIS — I10 Essential (primary) hypertension: Secondary | ICD-10-CM

## 2017-09-21 NOTE — Progress Notes (Signed)
Date:  09/21/2017   Name:  Penny Andrews   DOB:  03/02/1925   MRN:  [id number]   Chief Complaint: Diabetes and Hypertension Diabetes  She presents for her follow-up diabetic visit. She has type 2 diabetes mellitus. Her disease course has been stable. There are no hypoglycemic associated symptoms. Pertinent negatives for hypoglycemia include no headaches or tremors. Pertinent negatives for diabetes include no chest pain, no fatigue, no polydipsia and no polyuria. There are no hypoglycemic complications. Symptoms are stable. She monitors blood glucose at home 1-2 x per day. Her breakfast blood glucose is taken between 7-8 am. Her breakfast blood glucose range is generally 110-130 mg/dl.  Hypertension  This is a chronic problem. The problem is unchanged. The problem is controlled. Pertinent negatives include no chest pain, headaches, palpitations or shortness of breath.  Gastroesophageal Reflux  She reports no abdominal pain, no chest pain, no coughing, no heartburn, no water brash or no wheezing. Pertinent negatives include no fatigue. She has tried a PPI for the symptoms.   Lab Results  Component Value Date   HGBA1C 6.7 (H) 03/23/2017      Review of Systems  Constitutional: Negative for appetite change, fatigue, fever and unexpected weight change.  HENT: Negative for tinnitus and trouble swallowing.   Eyes: Negative for visual disturbance.  Respiratory: Negative for cough, chest tightness, shortness of breath and wheezing.   Cardiovascular: Negative for chest pain, palpitations and leg swelling.  Gastrointestinal: Negative for abdominal pain, constipation, diarrhea, heartburn and vomiting.  Endocrine: Negative for polydipsia and polyuria.  Genitourinary: Negative for dysuria and hematuria.  Musculoskeletal: Negative for arthralgias.  Skin: Negative for color change and rash.  Neurological: Negative for tremors, numbness and headaches.  Hematological: Negative for adenopathy.    Psychiatric/Behavioral: Negative for dysphoric mood and sleep disturbance.    Patient Active Problem List   Diagnosis Date Noted  . Hard of hearing 01/14/2015  . Allergic rhinitis 10/31/2014  . Controlled type 2 diabetes mellitus without complication, without long-term current use of insulin ([place]) 10/31/2014  . 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 Essential (primary) hypertension 10/31/2014  . 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 Hyperlipidemia associated with type 2 diabetes mellitus ([place]) 10/31/2014  . Gastrointestinal ulcer due to Helicobacter pylori 28/36/6294  . Has a tremor 10/31/2014  . Avitaminosis D 10/31/2014  . Encounter for other screening for malignant neoplasm of breast 07/24/2012    Prior to Admission medications   Medication Sig Start Date End Date Taking? Authorizing Provider  ACCU-CHEK SOFTCLIX LANCETS lancets  09/04/14   [provider]  aspirin 81 MG tablet Take 81 mg by mouth every evening.    [provider]  Blood Glucose Monitoring Suppl (ACCU-CHEK AVIVA PLUS) W/DEVICE KIT U UTD 09/04/14   [provider]  cholecalciferol (VITAMIN D) 1000 UNITS tablet Take 1,000 Units by mouth daily.    [provider]  ferrous fumarate (HEMOCYTE - 106 MG FE) 325 (106 FE) MG TABS tablet Take 1 tablet by mouth daily.    [provider]  glipiZIDE (GLUCOTROL) 5 MG tablet TAKE 1 TABLET(5 MG) BY MOUTH TWICE DAILY BEFORE A MEAL 12/14/16   Glean Hess, MD  glucose blood test strip  09/04/14   [provider]  lisinopril (PRINIVIL,ZESTRIL) 5 MG tablet TAKE 1 TABLET BY MOUTH DAILY 12/22/16   Glean Hess, MD  lovastatin (MEVACOR) 40 MG tablet TAKE 1 TABLET BY MOUTH EVERY EVENING 10/13/16   Glean Hess,  MD  metFORMIN (GLUCOPHAGE-XR) 750 MG 24 hr tablet TAKE 1 TABLET BY MOUTH TWICE DAILY 07/25/17   Glean Hess, MD  Multiple Vitamin (MULTIVITAMIN WITH MINERALS) TABS tablet Take 1 tablet by mouth daily.     [provider]  pantoprazole (PROTONIX) 40 MG tablet Take 40 mg by mouth daily.    [provider]  vitamin C (ASCORBIC ACID) 500 MG tablet Take by mouth.    [provider]    Allergies  Allergen Reactions  . Penicillins Swelling    Past Surgical History:  Procedure Laterality Date  . ESOPHAGOGASTRODUODENOSCOPY  07/2014   grade D esophagitis; prepyloric ulcer  . EYE SURGERY Bilateral    cataracts  . TONSILLECTOMY    . UMBILICAL HERNIA REPAIR      Social History   Tobacco Use  . Smoking status: Never Smoker  . Smokeless tobacco: Never Used  Substance Use Topics  . Alcohol use: Yes    Alcohol/week: 0.0 oz    Comment: occasionally once a year  . Drug use: No     Medication list has been reviewed and updated.  PHQ 2/9 Scores 03/23/2017 03/22/2016 11/09/2015 12/12/2014  PHQ - 2 Score 0 0 0 0    Physical Exam  Constitutional: She is oriented to person, place, and time. She appears well-developed. No distress.  HENT:  Head: Normocephalic and atraumatic.  Neck: Normal range of motion. Neck supple.  Cardiovascular: Normal rate, regular rhythm and normal heart sounds.  Pulmonary/Chest: Effort normal and breath sounds normal. No respiratory distress. She has no wheezes.  Musculoskeletal: Normal range of motion. She exhibits no edema or tenderness.  Neurological: She is alert and oriented to person, place, and time.  Skin: Skin is warm and dry. No rash noted.  Psychiatric: She has a normal mood and affect. Her behavior is normal. Thought content normal.  Nursing note and vitals reviewed.   BP 128/80   Pulse 75   Ht 5' 3.5" (1.613 m)   Wt 178 lb (80.7 kg)   SpO2 98%   BMI 31.04 kg/m   Assessment and Plan: 1. Essential (primary) hypertension controlled - Basic metabolic panel  2. Controlled type 2 diabetes mellitus without complication, without long-term current use of insulin (H[facility]) Doing well on oral agents - Hemoglobin A1c  3.  Hyperlipidemia associated with type 2 diabetes mellitus ([place]) On statin therapy  4. Gastrointestinal ulcer due to Helicobacter pylori Continue PPI, follow up with GI as needed   No orders of the defined types were placed in this encounter.   Partially dictated using Editor, commissioning. Any errors are unintentional.  Halina Maidens, MD [facility]  09/21/2017

## 2017-09-22 LAB — BASIC METABOLIC PANEL
BUN / CREAT RATIO: 16 (ref 12–28)
BUN: 13 mg/dL (ref 10–36)
CO2: 26 mmol/L (ref 20–29)
Calcium: 9.4 mg/dL (ref 8.7–10.3)
Chloride: 101 mmol/L (ref 96–106)
Creatinine, Ser: 0.82 mg/dL (ref 0.57–1.00)
GFR, EST AFRICAN AMERICAN: 73 mL/min/{1.73_m2} (ref >59)
GFR, EST NON AFRICAN AMERICAN: 63 mL/min/{1.73_m2} (ref >59)
Glucose: 109 mg/dL — ABNORMAL HIGH (ref 65–99)
POTASSIUM: 4.9 mmol/L (ref 3.5–5.2)
SODIUM: 142 mmol/L (ref 134–144)

## 2017-09-22 LAB — HEMOGLOBIN A1C
Est. average glucose Bld gHb Est-mCnc: 140 mg/dL
Hgb A1c MFr Bld: 6.5 % — ABNORMAL HIGH (ref 4.8–5.6)

## 2017-10-23 ENCOUNTER — Other Ambulatory Visit: Payer: Self-pay | Admitting: Internal Medicine

## 2017-12-27 ENCOUNTER — Other Ambulatory Visit: Payer: Self-pay | Admitting: Internal Medicine

## 2017-12-27 DIAGNOSIS — E119 Type 2 diabetes mellitus without complications: Secondary | ICD-10-CM

## 2018-01-29 ENCOUNTER — Other Ambulatory Visit: Payer: Self-pay | Admitting: Internal Medicine

## 2018-03-22 DIAGNOSIS — H26493 Other secondary cataract, bilateral: Secondary | ICD-10-CM | POA: Diagnosis not present

## 2018-03-22 LAB — HM DIABETES EYE EXAM

## 2018-03-26 ENCOUNTER — Ambulatory Visit: Payer: Medicare Other

## 2018-03-28 ENCOUNTER — Ambulatory Visit: Payer: Medicare Other

## 2018-03-28 ENCOUNTER — Ambulatory Visit (INDEPENDENT_AMBULATORY_CARE_PROVIDER_SITE_OTHER): Payer: Medicare Other

## 2018-03-28 VITALS — BP 148/70 | HR 65 | Temp 97.6°F | Resp 12 | Ht 64.0 in | Wt 180.2 lb

## 2018-03-28 DIAGNOSIS — Z23 Encounter for immunization: Secondary | ICD-10-CM | POA: Diagnosis not present

## 2018-03-28 DIAGNOSIS — Z0000 Encounter for general adult medical examination without abnormal findings: Secondary | ICD-10-CM

## 2018-03-28 NOTE — Patient Instructions (Signed)
Penny Andrews , Thank you for taking time to come for your Medicare Wellness Visit. I appreciate your ongoing commitment to your health goals. Please review the following plan we discussed and let me know if I can assist you in the future.   Screening recommendations/referrals: Colorectal Screening: No loner required Mammogram: No longer required  Bone Density: Declined  Vision and Dental Exams: Recommended annual ophthalmology exams for early detection of glaucoma and other disorders of the eye Recommended annual dental exams for proper oral hygiene  Diabetic Exams: Diabetic Eye Exam: Please call your ophthalmologist and ask that they forward your records from your most recent eye exam. Diabetic Foot Exam: To be completed 04/06/18  Vaccinations: Influenza vaccine: Completed today Pneumococcal vaccine: Up to date Tdap vaccine: Up to date Shingles vaccine: Please call your insurance company to determine your out of pocket expense for the Shingrix vaccine. You may receive this vaccine at your local pharmacy.  Advanced directives: Please bring a copy of your POA (Power of Attorney) and/or Living Will to your next appointment.  Goals: Recommend to eat 3 small healthy meals and at least 2 healthy snacks per day.  Next appointment: Please schedule your Annual Wellness Visit with your Nurse Health Advisor in one year.  Preventive Care 82 Years and Older, Female Preventive care refers to lifestyle choices and visits with your health care provider that can promote health and wellness. What does preventive care include?  A yearly physical exam. This is also called an annual well check.  Dental exams once or twice a year.  Routine eye exams. Ask your health care provider how often you should have your eyes checked.  Personal lifestyle choices, including:  Daily care of your teeth and gums.  Regular physical activity.  Eating a healthy diet.  Avoiding tobacco and drug use.  Limiting  alcohol use.  Practicing safe sex.  Taking low-dose aspirin every day if recommended by your health care provider.  Taking vitamin and mineral supplements as recommended by your health care provider. What happens during an annual well check? The services and screenings done by your health care provider during your annual well check will depend on your age, overall health, lifestyle risk factors, and family history of disease. Counseling  Your health care provider may ask you questions about your:  Alcohol use.  Tobacco use.  Drug use.  Emotional well-being.  Home and relationship well-being.  Sexual activity.  Eating habits.  History of falls.  Memory and ability to understand (cognition).  Work and work Statistician.  Reproductive health. Screening  You may have the following tests or measurements:  Height, weight, and BMI.  Blood pressure.  Lipid and cholesterol levels. These may be checked every 5 years, or more frequently if you are over 87 years old.  Skin check.  Lung cancer screening. You may have this screening every year starting at age 25 if you have a 30-pack-year history of smoking and currently smoke or have quit within the past 15 years.  Fecal occult blood test (FOBT) of the stool. You may have this test every year starting at age 74.  Flexible sigmoidoscopy or colonoscopy. You may have a sigmoidoscopy every 5 years or a colonoscopy every 10 years starting at age 11.  Hepatitis C blood test.  Hepatitis B blood test.  Sexually transmitted disease (STD) testing.  Diabetes screening. This is done by checking your blood sugar (glucose) after you have not eaten for a while (fasting). You may have this  done every 1-3 years.  Bone density scan. This is done to screen for osteoporosis. You may have this done starting at age 11.  Mammogram. This may be done every 1-2 years. Talk to your health care provider about how often you should have regular  mammograms. Talk with your health care provider about your test results, treatment options, and if necessary, the need for more tests. Vaccines  Your health care provider may recommend certain vaccines, such as:  Influenza vaccine. This is recommended every year.  Tetanus, diphtheria, and acellular pertussis (Tdap, Td) vaccine. You may need a Td booster every 10 years.  Zoster vaccine. You may need this after age 38.  Pneumococcal 13-valent conjugate (PCV13) vaccine. One dose is recommended after age 63.  Pneumococcal polysaccharide (PPSV23) vaccine. One dose is recommended after age 94. Talk to your health care provider about which screenings and vaccines you need and how often you need them. This information is not intended to replace advice given to you by your health care provider. Make sure you discuss any questions you have with your health care provider. Document Released: 07/03/2015 Document Revised: 02/24/2016 Document Reviewed: 04/07/2015 Elsevier Interactive Patient Education  2017 [employer] Prevention in the Home Falls can cause injuries. They can happen to people of all ages. There are many things you can do to make your home safe and to help prevent falls. What can I do on the outside of my home?  Regularly fix the edges of walkways and driveways and fix any cracks.  Remove anything that might make you trip as you walk through a door, such as a raised step or threshold.  Trim any bushes or trees on the path to your home.  Use bright outdoor lighting.  Clear any walking paths of anything that might make someone trip, such as rocks or tools.  Regularly check to see if handrails are loose or broken. Make sure that both sides of any steps have handrails.  Any raised decks and porches should have guardrails on the edges.  Have any leaves, snow, or ice cleared regularly.  Use sand or salt on walking paths during winter.  Clean up any spills in your garage  right away. This includes oil or grease spills. What can I do in the bathroom?  Use night lights.  Install grab bars by the toilet and in the tub and shower. Do not use towel bars as grab bars.  Use non-skid mats or decals in the tub or shower.  If you need to sit down in the shower, use a plastic, non-slip stool.  Keep the floor dry. Clean up any water that spills on the floor as soon as it happens.  Remove soap buildup in the tub or shower regularly.  Attach bath mats securely with double-sided non-slip rug tape.  Do not have throw rugs and other things on the floor that can make you trip. What can I do in the bedroom?  Use night lights.  Make sure that you have a light by your bed that is easy to reach.  Do not use any sheets or blankets that are too big for your bed. They should not hang down onto the floor.  Have a firm chair that has side arms. You can use this for support while you get dressed.  Do not have throw rugs and other things on the floor that can make you trip. What can I do in the kitchen?  Clean up any spills  right away.  Avoid walking on wet floors.  Keep items that you use a lot in easy-to-reach places.  If you need to reach something above you, use a strong step stool that has a grab bar.  Keep electrical cords out of the way.  Do not use floor polish or wax that makes floors slippery. If you must use wax, use non-skid floor wax.  Do not have throw rugs and other things on the floor that can make you trip. What can I do with my stairs?  Do not leave any items on the stairs.  Make sure that there are handrails on both sides of the stairs and use them. Fix handrails that are broken or loose. Make sure that handrails are as long as the stairways.  Check any carpeting to make sure that it is firmly attached to the stairs. Fix any carpet that is loose or worn.  Avoid having throw rugs at the top or bottom of the stairs. If you do have throw rugs,  attach them to the floor with carpet tape.  Make sure that you have a light switch at the top of the stairs and the bottom of the stairs. If you do not have them, ask someone to add them for you. What else can I do to help prevent falls?  Wear shoes that:  Do not have high heels.  Have rubber bottoms.  Are comfortable and fit you well.  Are closed at the toe. Do not wear sandals.  If you use a stepladder:  Make sure that it is fully opened. Do not climb a closed stepladder.  Make sure that both sides of the stepladder are locked into place.  Ask someone to hold it for you, if possible.  Clearly mark and make sure that you can see:  Any grab bars or handrails.  First and last steps.  Where the edge of each step is.  Use tools that help you move around (mobility aids) if they are needed. These include:  Canes.  Walkers.  Scooters.  Crutches.  Turn on the lights when you go into a dark area. Replace any light bulbs as soon as they burn out.  Set up your furniture so you have a clear path. Avoid moving your furniture around.  If any of your floors are uneven, fix them.  If there are any pets around you, be aware of where they are.  Review your medicines with your doctor. Some medicines can make you feel dizzy. This can increase your chance of falling. Ask your doctor what other things that you can do to help prevent falls. This information is not intended to replace advice given to you by your health care provider. Make sure you discuss any questions you have with your health care provider. Document Released: 04/02/2009 Document Revised: 11/12/2015 Document Reviewed: 07/11/2014 Elsevier Interactive Patient Education  2017 [employer].

## 2018-03-28 NOTE — Progress Notes (Signed)
Subjective:   Penny Andrews is a 84 y.o. female who presents for Medicare Annual (Subsequent) preventive examination.  Review of Systems:  N/A Cardiac Risk Factors include: advanced age (>81mn, >>38women);hypertension;diabetes mellitus;dyslipidemia;obesity (BMI >30kg/m2);sedentary lifestyle     Objective:     Vitals: BP (!) 148/70 (BP Location: Right Arm, Patient Position: Sitting, Cuff Size: Large)   Pulse 65   Temp 97.6 F (36.4 C) (Oral)   Resp 12   Ht '5\' 4"'  (1.626 m)   Wt 180 lb 3.2 oz (81.7 kg)   SpO2 98%   BMI 30.93 kg/m   Body mass index is 30.93 kg/m.  Advanced Directives 03/28/2018 11/04/2014  Does Patient Have a Medical Advance Directive? Yes Yes  Type of [place] Living will  Copy of [place]? No - copy requested No - copy requested    Tobacco Social History   Tobacco Use  Smoking Status Never Smoker  Smokeless Tobacco Never Used  Tobacco Comment   smoking cessation materials not required     Counseling given: No Comment: smoking cessation materials not required  Clinical Intake:  Pre-visit preparation completed: Yes  Pain : No/denies pain   BMI - recorded: 30.93 Nutritional Status: BMI > 30  Obese Nutritional Risks: None  Nutrition Risk Assessment:  Has the patient had any N/V/D within the last 2 months?  No  Does the patient have any non-healing wounds?  No  Has the patient had any unintentional weight loss or weight gain?  No   Diabetes:  Is the patient diabetic?  Yes  If diabetic, was a CBG obtained today?  No  Did the patient bring in their glucometer from home?  No  How often do you monitor your CBG's? prn.   Financial Strains and Diabetes Management:  Are you having any financial strains with the device, your supplies or your medication? No .  Does the patient want to be seen by Chronic Care Management for management of their diabetes?  No  Would the  patient like to be referred to a Nutritionist or for Diabetic Management?  No   Diabetic Exams:   Diabetic Eye Exam: Completed 03/20/18.   Diabetic Foot Exam: Completed 03/23/17. Pt has been advised about the importance in completing this exam. Pt is scheduled for diabetic foot exam on 04/06/18.  How often do you need to have someone help you when you read instructions, pamphlets, or other written materials from your doctor or pharmacy?: 1 - Never  Interpreter Needed?: No  Information entered by :: AIdell Pickles LPN  Past Medical History:  Diagnosis Date  . Diabetes mellitus without complication ([facility]    Patient reported  . GERD (gastroesophageal reflux disease)   . Hypertension    Past Surgical History:  Procedure Laterality Date  . ESOPHAGOGASTRODUODENOSCOPY  07/2014   grade D esophagitis; prepyloric ulcer  . EYE SURGERY Bilateral    cataracts  . TONSILLECTOMY    . UMBILICAL HERNIA REPAIR     Family History  Problem Relation Age of Onset  . CAD Father   . Cancer Mother    Social History   Socioeconomic History  . Marital status: Married    Spouse name: Not on file  . Number of children: 4  . Years of education: Not on file  . Highest education level: 12th grade  Occupational History  . Occupation: Retired  SScientific laboratory technician . Financial resource strain: Not hard at  all  . Food insecurity:    Worry: Never true    Inability: Never true  . Transportation needs:    Medical: No    Non-medical: No  Tobacco Use  . Smoking status: Never Smoker  . Smokeless tobacco: Never Used  . Tobacco comment: smoking cessation materials not required  Substance and Sexual Activity  . Alcohol use: Yes    Alcohol/week: 0.0 standard drinks    Comment: occasionally once a year  . Drug use: No  . Sexual activity: Not Currently  Lifestyle  . Physical activity:    Days per week: 0 days    Minutes per session: 0 min  . Stress: Not at all  Relationships  . Social connections:    Talks on  phone: Patient refused    Gets together: Patient refused    Attends religious service: Patient refused    Active member of club or organization: Patient refused    Attends meetings of clubs or organizations: Patient refused    Relationship status: Married  Other Topics Concern  . Not on file  Social History Narrative  . Not on file    Outpatient Encounter Medications as of 03/28/2018  Medication Sig  . ACCU-CHEK SOFTCLIX LANCETS lancets   . aspirin 81 MG tablet Take 81 mg by mouth every evening.  . Blood Glucose Monitoring Suppl (ACCU-CHEK AVIVA PLUS) W/DEVICE KIT U UTD  . cholecalciferol (VITAMIN D) 1000 UNITS tablet Take 1,000 Units by mouth daily.  . ferrous fumarate (HEMOCYTE - 106 MG FE) 325 (106 FE) MG TABS tablet Take 1 tablet by mouth daily.  Marland Kitchen glipiZIDE (GLUCOTROL) 5 MG tablet TAKE 1 TABLET(5 MG) BY MOUTH TWICE DAILY BEFORE A MEAL  . glucose blood test strip   . lisinopril (PRINIVIL,ZESTRIL) 5 MG tablet TAKE 1 TABLET BY MOUTH DAILY  . lovastatin (MEVACOR) 40 MG tablet TAKE 1 TABLET BY MOUTH EVERY EVENING  . metFORMIN (GLUCOPHAGE-XR) 750 MG 24 hr tablet TAKE 1 TABLET BY MOUTH TWICE DAILY  . Multiple Vitamin (MULTIVITAMIN WITH MINERALS) TABS tablet Take 1 tablet by mouth daily.  . pantoprazole (PROTONIX) 40 MG tablet Take 40 mg by mouth daily.  . vitamin C (ASCORBIC ACID) 500 MG tablet Take by mouth.   No facility-administered encounter medications on file as of 03/28/2018.     Activities of Daily Living In your present state of health, do you have any difficulty performing the following activities: 03/28/2018  Hearing? N  Comment B hearing aids  Vision? N  Comment wears eyeglasses  Difficulty concentrating or making decisions? N  Walking or climbing stairs? N  Dressing or bathing? N  Doing errands, shopping? N  Preparing Food and eating ? N  Comment denies dentures  Using the Toilet? N  In the past six months, have you accidently leaked urine? N  Do you have problems  with loss of bowel control? N  Managing your Medications? N  Managing your Finances? N  Housekeeping or managing your Housekeeping? N  Some recent data might be hidden    Patient Care Team: Glean Hess, MD as PCP - General (Internal Medicine) Lollie Sails, MD as Consulting Physician (Gastroenterology) Teodoro Spray, MD as Consulting Physician (Cardiology)    Assessment:   This is a routine wellness examination for [place].  Exercise Activities and Dietary recommendations Current Exercise Habits: The patient does not participate in regular exercise at present, Exercise limited by: None identified  Goals    . DIET - EAT MORE  FRUITS AND VEGETABLES     Recommend to eat 3 small healthy meals and at least 2 healthy snacks per day.       Fall Risk Fall Risk  03/28/2018 03/23/2017 03/22/2016 11/09/2015 12/12/2014  Falls in the past year? No No Yes No No  Number falls in past yr: - - 1 - -  Injury with Fall? - - Yes - -  Risk for fall due to : Impaired vision - Impaired balance/gait - -  Risk for fall due to: Comment wears eyeglasses - - - -  Follow up - - Falls prevention discussed - -   FALL RISK PREVENTION PERTAINING TO THE HOME:  Any stairs in or around the home WITH handrails? Yes  Home free of loose throw rugs in walkways, pet beds, electrical cords, etc? Yes  Adequate lighting in your home to reduce risk of falls? Yes   ASSISTIVE DEVICES UTILIZED TO PREVENT FALLS:  Life alert? No  Use of a cane, walker or w/c? No  Grab bars in the bathroom? Yes  Shower chair or bench in shower? Yes  Elevated toilet seat or a handicapped toilet? Yes   DME ORDERS:  DME order needed?  No   TIMED UP AND GO:  Was the test performed? Yes .  Length of time to ambulate 10 feet: 20 sec.   GAIT:  Appearance of gait: Gait slow, steady and without the use of an assistive device.  Education: Fall risk prevention has been discussed.  Intervention(s) required? No   Depression  Screen PHQ 2/9 Scores 03/28/2018 03/23/2017 03/22/2016 11/09/2015  PHQ - 2 Score 0 0 0 0  PHQ- 9 Score 0 - - -     Cognitive Function     6CIT Screen 03/28/2018 03/23/2017  What Year? 0 points 0 points  What month? 0 points 0 points  What time? 0 points 0 points  Count back from 20 0 points 0 points  Months in reverse 0 points 0 points  Repeat phrase 8 points 4 points  Total Score 8 4    Immunization History  Administered Date(s) Administered  . Influenza, High Dose Seasonal PF 03/23/2017, 03/28/2018  . Influenza-Unspecified 03/14/2016  . Pneumococcal Conjugate-13 07/29/2013, 03/22/2016  . Pneumococcal Polysaccharide-23 03/07/2006  . Pneumococcal-Unspecified 06/20/2012  . Tdap 06/29/2011  . Zoster 12/03/2006    Qualifies for Shingles Vaccine? Yes  Zostavax completed 12/02/16. Due for Shingrix. Education has been provided regarding the importance of this vaccine. Pt has been advised to call insurance company to determine out of pocket expense. Advised may also receive vaccine at local pharmacy or Health Dept. Verbalized acceptance and understanding.  Flu Vaccine: Due for Flu vaccine. Does the patient want to receive this vaccine today?  Yes .   Screening Tests Health Maintenance  Topic Date Due  . FOOT EXAM  03/23/2018  . HEMOGLOBIN A1C  03/23/2018  . OPHTHALMOLOGY EXAM  03/21/2019  . TETANUS/TDAP  06/28/2021  . INFLUENZA VACCINE  Completed  . PNA vac Low Risk Adult  Completed    Cancer Screenings:  Colorectal Screening: No longer required.   Mammogram: No longer required.   Bone Density: Declined  Lung Cancer Screening: (Low Dose CT Chest recommended if Age 44-80 years, 30 pack-year currently smoking OR have quit w/in 15years.) does not qualify.   Additional Screening:  Hepatitis C Screening: does not qualify  Dental Screening: Recommended annual dental exams for proper oral hygiene  Community Resource Referral:  CRR required this visit?  No  Plan:  I  have personally reviewed and addressed the Medicare Annual Wellness questionnaire and have noted the following in the patient's chart:  A. Medical and social history B. Use of alcohol, tobacco or illicit drugs  C. Current medications and supplements D. Functional ability and status E.  Nutritional status F.  Physical activity G. Advance directives H. List of other physicians I.  Hospitalizations, surgeries, and ER visits in previous 12 months J.  [place] such as hearing and vision if needed, cognitive and depression L. Referrals and appointments  In addition, I have reviewed and discussed with patient certain preventive protocols, quality metrics, and best practice recommendations. A written personalized care plan for preventive services as well as general preventive health recommendations were provided to patient.  Signed,  Aleatha Borer, LPN Nurse Health Advisor  MD Recommendations: Zostavax completed 12/02/16. Due for Shingrix. Education has been provided regarding the importance of this vaccine. Pt has been advised to call insurance company to determine out of pocket expense. Advised may also receive vaccine at local pharmacy or Health Dept. Verbalized acceptance and understanding.

## 2018-04-06 ENCOUNTER — Encounter: Payer: Self-pay | Admitting: Internal Medicine

## 2018-04-06 ENCOUNTER — Ambulatory Visit (INDEPENDENT_AMBULATORY_CARE_PROVIDER_SITE_OTHER): Payer: Medicare Other | Admitting: Internal Medicine

## 2018-04-06 VITALS — BP 134/78 | HR 73 | Ht 64.0 in | Wt 178.0 lb

## 2018-04-06 DIAGNOSIS — E119 Type 2 diabetes mellitus without complications: Secondary | ICD-10-CM

## 2018-04-06 DIAGNOSIS — I10 Essential (primary) hypertension: Secondary | ICD-10-CM

## 2018-04-06 DIAGNOSIS — B9681 Helicobacter pylori [H. pylori] as the cause of diseases classified elsewhere: Secondary | ICD-10-CM

## 2018-04-06 DIAGNOSIS — E1169 Type 2 diabetes mellitus with other specified complication: Secondary | ICD-10-CM | POA: Diagnosis not present

## 2018-04-06 DIAGNOSIS — K289 Gastrojejunal ulcer, unspecified as acute or chronic, without hemorrhage or perforation: Secondary | ICD-10-CM

## 2018-04-06 DIAGNOSIS — E785 Hyperlipidemia, unspecified: Secondary | ICD-10-CM | POA: Diagnosis not present

## 2018-04-06 MED ORDER — PANTOPRAZOLE SODIUM 40 MG PO TBEC: 40.0000 mg | DELAYED_RELEASE_TABLET | Freq: Every day | ORAL | 3 refills | Status: DC

## 2018-04-06 NOTE — Progress Notes (Signed)
Date:  04/06/2018   Name:  Penny Andrews   DOB:  1925-05-14   MRN:  [id number]   Chief Complaint: Hypertension (Patient just had MAW with A[facility] 10/09. ) and Diabetes (yearly check up. )  Diabetes  She presents for her follow-up diabetic visit. She has type 2 diabetes mellitus. Her disease course has been stable. Pertinent negatives for hypoglycemia include no dizziness, headaches, nervousness/anxiousness or tremors. Pertinent negatives for diabetes include no chest pain, no fatigue, no foot paresthesias, no polydipsia and no polyuria. Symptoms are stable. Current diabetic treatment includes oral agent (monotherapy). She is compliant with treatment all of the time. She monitors blood glucose at home 3-4 x per week. Her breakfast blood glucose is taken between 6-7 am. Her breakfast blood glucose range is generally 90-110 mg/dl. An ACE inhibitor/angiotensin II receptor blocker is being taken. Eye exam is current.  Hypertension  This is a chronic problem. The problem is controlled. Pertinent negatives include no chest pain, headaches, palpitations or shortness of breath. Past treatments include ACE inhibitors.  Hyperlipidemia  This is a chronic problem. Recent lipid tests were reviewed and are normal. Pertinent negatives include no chest pain or shortness of breath. Current antihyperlipidemic treatment includes statins. The current treatment provides significant improvement of lipids.  Gastroesophageal Reflux  She reports no abdominal pain, no chest pain, no coughing or no wheezing. Pertinent negatives include no fatigue.    Review of Systems  Constitutional: Negative for chills, fatigue and fever.  HENT: Negative for congestion, hearing loss, tinnitus, trouble swallowing and voice change.   Eyes: Negative for visual disturbance.  Respiratory: Negative for cough, chest tightness, shortness of breath and wheezing.   Cardiovascular: Negative for chest pain, palpitations and leg swelling.    Gastrointestinal: Negative for abdominal pain, constipation, diarrhea and vomiting.  Endocrine: Negative for polydipsia and polyuria.  Genitourinary: Negative for dysuria, frequency, genital sores, vaginal bleeding and vaginal discharge.  Musculoskeletal: Negative for arthralgias, gait problem and joint swelling.  Skin: Negative for color change and rash.  Neurological: Negative for dizziness, tremors, light-headedness and headaches.  Hematological: Negative for adenopathy. Does not bruise/bleed easily.  Psychiatric/Behavioral: Negative for dysphoric mood and sleep disturbance. The patient is not nervous/anxious.     Patient Active Problem List   Diagnosis Date Noted  . Hard of hearing 01/14/2015  . Allergic rhinitis 10/31/2014  . Controlled type 2 diabetes mellitus without complication, without long-term current use of insulin ([place]) 10/31/2014  . 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 Essential (primary) hypertension 10/31/2014  . 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 Hyperlipidemia associated with type 2 diabetes mellitus ([place]) 10/31/2014  . Gastrointestinal ulcer due to Helicobacter pylori 43/32/9518  . Has a tremor 10/31/2014  . Avitaminosis D 10/31/2014    Allergies  Allergen Reactions  . Penicillins Swelling    Past Surgical History:  Procedure Laterality Date  . ESOPHAGOGASTRODUODENOSCOPY  07/2014   grade D esophagitis; prepyloric ulcer  . EYE SURGERY Bilateral    cataracts  . TONSILLECTOMY    . UMBILICAL HERNIA REPAIR      Social History   Tobacco Use  . Smoking status: Never Smoker  . Smokeless tobacco: Never Used  . Tobacco comment: smoking cessation materials not required  Substance Use Topics  . Alcohol use: Yes    Alcohol/week: 0.0 standard drinks    Comment: occasionally once a year  . Drug use: No     Medication list has been reviewed and updated.  Current Meds  Medication Sig  . ACCU-CHEK SOFTCLIX LANCETS lancets   . aspirin 81  MG tablet Take 81 mg by mouth every evening.  . Blood Glucose Monitoring Suppl (ACCU-CHEK AVIVA PLUS) W/DEVICE KIT U UTD  . cholecalciferol (VITAMIN D) 1000 UNITS tablet Take 1,000 Units by mouth daily.  Marland Kitchen glipiZIDE (GLUCOTROL) 5 MG tablet TAKE 1 TABLET(5 MG) BY MOUTH TWICE DAILY BEFORE A MEAL  . glucose blood test strip   . lisinopril (PRINIVIL,ZESTRIL) 5 MG tablet TAKE 1 TABLET BY MOUTH DAILY  . lovastatin (MEVACOR) 40 MG tablet TAKE 1 TABLET BY MOUTH EVERY EVENING  . metFORMIN (GLUCOPHAGE-XR) 750 MG 24 hr tablet TAKE 1 TABLET BY MOUTH TWICE DAILY  . Multiple Vitamin (MULTIVITAMIN WITH MINERALS) TABS tablet Take 1 tablet by mouth daily.  . pantoprazole (PROTONIX) 40 MG tablet Take 40 mg by mouth daily.  . vitamin C (ASCORBIC ACID) 500 MG tablet Take by mouth.    PHQ 2/9 Scores 03/28/2018 03/23/2017 03/22/2016 11/09/2015  PHQ - 2 Score 0 0 0 0  PHQ- 9 Score 0 - - -    Physical Exam  Constitutional: She is oriented to person, place, and time. She appears well-developed and well-nourished. No distress.  HENT:  Head: Normocephalic and atraumatic.  Right Ear: Tympanic membrane and ear canal normal.  Left Ear: Tympanic membrane and ear canal normal.  Nose: Right sinus exhibits no maxillary sinus tenderness. Left sinus exhibits no maxillary sinus tenderness.  Mouth/Throat: Uvula is midline and oropharynx is clear and moist.  Eyes: Conjunctivae and EOM are normal. Right eye exhibits no discharge. Left eye exhibits no discharge. No scleral icterus.  Neck: Normal range of motion. Carotid bruit is not present. No erythema present. No thyromegaly present.  Cardiovascular: Normal rate, regular rhythm, normal heart sounds and normal pulses.  Pulmonary/Chest: Effort normal. No respiratory distress. She has no wheezes.  Abdominal: Soft. Bowel sounds are normal. There is no hepatosplenomegaly. There is no tenderness. There is no CVA tenderness.  Musculoskeletal: Normal range of motion.  Lymphadenopathy:     She has no cervical adenopathy.    She has no axillary adenopathy.  Neurological: She is alert and oriented to person, place, and time. She has normal reflexes. No cranial nerve deficit or sensory deficit.  Skin: Skin is warm, dry and intact. No rash noted.  Psychiatric: She has a normal mood and affect. Her speech is normal and behavior is normal. Thought content normal.  Nursing note and vitals reviewed.   BP 134/78 (BP Location: Right Arm, Patient Position: Sitting, Cuff Size: Normal)   Pulse 73   Ht '5\' 4"'  (1.626 m)   Wt 178 lb (80.7 kg)   SpO2 96%   BMI 30.55 kg/m   Assessment and Plan: 1. Essential (primary) hypertension controlled - CBC with Differential/Platelet  2. Controlled type 2 diabetes mellitus without complication, without long-term current use of insulin ([place]) Doing well on oral agents - Comprehensive metabolic panel - Hemoglobin A1c - TSH  3. Hyperlipidemia associated with type 2 diabetes mellitus ([place]) On statin therapy - Lipid panel  4. Gastrointestinal ulcer due to Helicobacter pylori No sx on daily PPI - CBC with Differential/Platelet - pantoprazole (PROTONIX) 40 MG tablet; Take 1 tablet (40 mg total) by mouth daily.  Dispense: 90 tablet; Refill: 3   Partially dictated using Editor, commissioning. Any errors are unintentional.  Halina Maidens, MD [facility]  04/06/2018

## 2018-04-07 LAB — CBC WITH DIFFERENTIAL/PLATELET
BASOS: 1 %
Basophils Absolute: 0 10*3/uL (ref 0.0–0.2)
EOS (ABSOLUTE): 0.2 10*3/uL (ref 0.0–0.4)
EOS: 3 %
HEMATOCRIT: 44.2 % (ref 34.0–46.6)
Hemoglobin: 14.4 g/dL (ref 11.1–15.9)
IMMATURE GRANULOCYTES: 0 %
Immature Grans (Abs): 0 10*3/uL (ref 0.0–0.1)
LYMPHS: 36 %
Lymphocytes Absolute: 2.2 10*3/uL (ref 0.7–3.1)
MCH: 28.2 pg (ref 26.6–33.0)
MCHC: 32.6 g/dL (ref 31.5–35.7)
MCV: 87 fL (ref 79–97)
Monocytes Absolute: 0.5 10*3/uL (ref 0.1–0.9)
Monocytes: 7 %
NEUTROS PCT: 53 %
Neutrophils Absolute: 3.2 10*3/uL (ref 1.4–7.0)
Platelets: 211 10*3/uL (ref 150–450)
RBC: 5.1 x10E6/uL (ref 3.77–5.28)
RDW: 13 % (ref 12.3–15.4)
WBC: 6.2 10*3/uL (ref 3.4–10.8)

## 2018-04-07 LAB — COMPREHENSIVE METABOLIC PANEL
A/G RATIO: 1.8 (ref 1.2–2.2)
ALT: 26 IU/L (ref 0–32)
AST: 32 IU/L (ref 0–40)
Albumin: 4.4 g/dL (ref 3.2–4.6)
Alkaline Phosphatase: 66 IU/L (ref 39–117)
BUN/Creatinine Ratio: 16 (ref 12–28)
BUN: 12 mg/dL (ref 10–36)
Bilirubin Total: 0.5 mg/dL (ref 0.0–1.2)
CALCIUM: 9.4 mg/dL (ref 8.7–10.3)
CO2: 27 mmol/L (ref 20–29)
Chloride: 100 mmol/L (ref 96–106)
Creatinine, Ser: 0.77 mg/dL (ref 0.57–1.00)
GFR, EST AFRICAN AMERICAN: 79 mL/min/{1.73_m2} (ref >59)
GFR, EST NON AFRICAN AMERICAN: 68 mL/min/{1.73_m2} (ref >59)
GLOBULIN, TOTAL: 2.5 g/dL (ref 1.5–4.5)
Glucose: 74 mg/dL (ref 65–99)
POTASSIUM: 4.4 mmol/L (ref 3.5–5.2)
SODIUM: 142 mmol/L (ref 134–144)
TOTAL PROTEIN: 6.9 g/dL (ref 6.0–8.5)

## 2018-04-07 LAB — LIPID PANEL
CHOLESTEROL TOTAL: 177 mg/dL (ref 100–199)
Chol/HDL Ratio: 3.5 ratio (ref 0.0–4.4)
HDL: 51 mg/dL (ref >39)
LDL CALC: 96 mg/dL (ref 0–99)
Triglycerides: 152 mg/dL — ABNORMAL HIGH (ref 0–149)
VLDL Cholesterol Cal: 30 mg/dL (ref 5–40)

## 2018-04-07 LAB — TSH: TSH: 1.99 u[IU]/mL (ref 0.450–4.500)

## 2018-04-07 LAB — HEMOGLOBIN A1C
Est. average glucose Bld gHb Est-mCnc: 134 mg/dL
Hgb A1c MFr Bld: 6.3 % — ABNORMAL HIGH (ref 4.8–5.6)

## 2018-04-28 ENCOUNTER — Other Ambulatory Visit: Payer: Self-pay | Admitting: Internal Medicine

## 2018-05-07 ENCOUNTER — Other Ambulatory Visit: Payer: Self-pay

## 2018-05-28 DIAGNOSIS — I10 Essential (primary) hypertension: Secondary | ICD-10-CM | POA: Diagnosis not present

## 2018-08-10 ENCOUNTER — Ambulatory Visit: Payer: Medicare Other | Admitting: Internal Medicine

## 2018-08-17 ENCOUNTER — Ambulatory Visit (INDEPENDENT_AMBULATORY_CARE_PROVIDER_SITE_OTHER): Payer: Medicare Other | Admitting: Internal Medicine

## 2018-08-17 ENCOUNTER — Other Ambulatory Visit: Payer: Self-pay

## 2018-08-17 ENCOUNTER — Encounter: Payer: Self-pay | Admitting: Internal Medicine

## 2018-08-17 VITALS — BP 112/68 | HR 80 | Ht 64.0 in | Wt 176.0 lb

## 2018-08-17 DIAGNOSIS — E785 Hyperlipidemia, unspecified: Secondary | ICD-10-CM

## 2018-08-17 DIAGNOSIS — I10 Essential (primary) hypertension: Secondary | ICD-10-CM

## 2018-08-17 DIAGNOSIS — Z794 Long term (current) use of insulin: Secondary | ICD-10-CM | POA: Diagnosis not present

## 2018-08-17 DIAGNOSIS — E1169 Type 2 diabetes mellitus with other specified complication: Secondary | ICD-10-CM | POA: Diagnosis not present

## 2018-08-17 NOTE — Progress Notes (Signed)
Date:  08/17/2018   Name:  Penny Andrews   DOB:  [birth year]   MRN:  [id number]   Chief Complaint: Diabetes and Hypertension  Hypertension  This is a chronic problem. The problem is controlled. Pertinent negatives include no chest pain, headaches, palpitations or shortness of breath. Past treatments include ACE inhibitors. The current treatment provides significant improvement. There is no history of CAD/MI or heart failure.  Diabetes  She presents for her follow-up diabetic visit. She has type 2 diabetes mellitus. Her disease course has been stable. Pertinent negatives for hypoglycemia include no headaches or tremors. Pertinent negatives for diabetes include no chest pain, no fatigue, no polydipsia and no polyuria. Current diabetic treatment includes oral agent (dual therapy). She is compliant with treatment all of the time. She monitors blood glucose at home 3-4 x per week. There is no change in her home blood glucose trend. Her breakfast blood glucose is taken between 7-8 am. Her breakfast blood glucose range is generally 110-130 mg/dl. An ACE inhibitor/angiotensin II receptor blocker is being taken.  Hyperlipidemia  This is a chronic problem. The problem is controlled. Pertinent negatives include no chest pain or shortness of breath. Current antihyperlipidemic treatment includes statins. The current treatment provides significant improvement of lipids.   Lab Results  Component Value Date   HGBA1C 6.3 (H) 04/06/2018   Lab Results  Component Value Date   CHOL 177 04/06/2018   HDL 51 04/06/2018   LDLCALC 96 04/06/2018   TRIG 152 (H) 04/06/2018   CHOLHDL 3.5 04/06/2018   Lab Results  Component Value Date   CREATININE 0.77 04/06/2018   BUN 12 04/06/2018   NA 142 04/06/2018   K 4.4 04/06/2018   CL 100 04/06/2018   CO2 27 04/06/2018     Review of Systems  Constitutional: Negative for appetite change, fatigue, fever and unexpected weight change.  HENT: Negative for tinnitus and  trouble swallowing.   Eyes: Negative for visual disturbance.  Respiratory: Negative for cough, chest tightness and shortness of breath.   Cardiovascular: Negative for chest pain, palpitations and leg swelling.  Gastrointestinal: Negative for abdominal pain.  Endocrine: Negative for polydipsia and polyuria.  Genitourinary: Negative for dysuria and hematuria.  Musculoskeletal: Negative for arthralgias.  Neurological: Negative for tremors, numbness and headaches.  Psychiatric/Behavioral: Negative for dysphoric mood.    Patient Active Problem List   Diagnosis Date Noted  . Hard of hearing 01/14/2015  . Allergic rhinitis 10/31/2014  . 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 Essential (primary) hypertension 10/31/2014  . Hyperlipidemia associated with type 2 diabetes mellitus ([place]) 10/31/2014  . Gastrointestinal ulcer due to Helicobacter pylori 63/84/5364  . Has a tremor 10/31/2014  . Avitaminosis D 10/31/2014  . Type 2 diabetes mellitus with other specified complication ([place]) 68/08/2120    Allergies  Allergen Reactions  . Penicillins Swelling    Past Surgical History:  Procedure Laterality Date  . ESOPHAGOGASTRODUODENOSCOPY  07/2014   grade D esophagitis; prepyloric ulcer  . EYE SURGERY Bilateral    cataracts  . TONSILLECTOMY    . UMBILICAL HERNIA REPAIR      Social History   Tobacco Use  . Smoking status: Never Smoker  . Smokeless tobacco: Never Used  . Tobacco comment: smoking cessation materials not required  Substance Use Topics  . Alcohol use: Yes    Alcohol/week: 0.0 standard drinks    Comment: occasionally once a year  . Drug use: No     Medication list has been reviewed  and updated.  Current Meds  Medication Sig  . ACCU-CHEK SOFTCLIX LANCETS lancets   . aspirin 81 MG tablet Take 81 mg by mouth every evening.  . Blood Glucose Monitoring Suppl (ACCU-CHEK AVIVA PLUS) W/DEVICE KIT U UTD  . cholecalciferol (VITAMIN D) 1000 UNITS tablet Take 1,000 Units by  mouth daily.  Marland Kitchen glipiZIDE (GLUCOTROL) 5 MG tablet TAKE 1 TABLET(5 MG) BY MOUTH TWICE DAILY BEFORE A MEAL  . glucose blood test strip   . lisinopril (PRINIVIL,ZESTRIL) 5 MG tablet TAKE 1 TABLET BY MOUTH DAILY  . lovastatin (MEVACOR) 40 MG tablet TAKE 1 TABLET BY MOUTH EVERY EVENING  . metFORMIN (GLUCOPHAGE-XR) 750 MG 24 hr tablet TAKE 1 TABLET BY MOUTH TWICE DAILY  . Multiple Vitamin (MULTIVITAMIN WITH MINERALS) TABS tablet Take 1 tablet by mouth daily.  . vitamin C (ASCORBIC ACID) 500 MG tablet Take by mouth.    PHQ 2/9 Scores 08/17/2018 03/28/2018 03/23/2017 03/22/2016  PHQ - 2 Score 0 0 0 0  PHQ- 9 Score - 0 - -   Wt Readings from Last 3 Encounters:  08/17/18 176 lb (79.8 kg)  04/06/18 178 lb (80.7 kg)  03/28/18 180 lb 3.2 oz (81.7 kg)    Physical Exam Constitutional:      Appearance: Normal appearance.  Neck:     Musculoskeletal: Normal range of motion and neck supple.  Cardiovascular:     Rate and Rhythm: Normal rate and regular rhythm.     Pulses: Normal pulses.  Pulmonary:     Effort: Pulmonary effort is normal.     Breath sounds: Normal breath sounds. No wheezing or rales.  Musculoskeletal: Normal range of motion.     Right lower leg: No edema.     Left lower leg: No edema.  Lymphadenopathy:     Cervical: No cervical adenopathy.  Skin:    General: Skin is warm and dry.  Neurological:     Mental Status: She is alert.  Psychiatric:        Mood and Affect: Mood normal.        Behavior: Behavior normal.     BP 112/68   Pulse 80   Ht _0  (1.626 m)   Wt 176 lb (79.8 kg)   SpO2 95%   BMI 30.21 kg/m   Assessment and Plan: 1. Essential (primary) hypertension Controlled, continue antibiotics - Basic metabolic panel  2. Type 2 diabetes mellitus with other specified complication, with long-term current use of insulin ([facility]) Doing well on oral agents - Hemoglobin A1c  3. Hyperlipidemia associated with type 2 diabetes mellitus ([place]) On statin  therapy   Partially dictated using Editor, commissioning. Any errors are unintentional.  Halina Maidens, MD [facility]  08/17/2018

## 2018-08-18 LAB — BASIC METABOLIC PANEL
BUN / CREAT RATIO: 18 (ref 12–28)
BUN: 15 mg/dL (ref 10–36)
CALCIUM: 9.6 mg/dL (ref 8.7–10.3)
CO2: 23 mmol/L (ref 20–29)
Chloride: 103 mmol/L (ref 96–106)
Creatinine, Ser: 0.82 mg/dL (ref 0.57–1.00)
GFR calc non Af Amer: 63 mL/min/{1.73_m2} (ref >59)
GFR, EST AFRICAN AMERICAN: 72 mL/min/{1.73_m2} (ref >59)
GLUCOSE: 102 mg/dL — AB (ref 65–99)
POTASSIUM: 4.7 mmol/L (ref 3.5–5.2)
Sodium: 143 mmol/L (ref 134–144)

## 2018-08-18 LAB — HEMOGLOBIN A1C
Est. average glucose Bld gHb Est-mCnc: 140 mg/dL
Hgb A1c MFr Bld: 6.5 % — ABNORMAL HIGH (ref 4.8–5.6)

## 2018-08-28 ENCOUNTER — Other Ambulatory Visit: Payer: Self-pay | Admitting: Internal Medicine

## 2018-08-28 DIAGNOSIS — B9681 Helicobacter pylori [H. pylori] as the cause of diseases classified elsewhere: Secondary | ICD-10-CM

## 2018-08-28 DIAGNOSIS — K289 Gastrojejunal ulcer, unspecified as acute or chronic, without hemorrhage or perforation: Principal | ICD-10-CM

## 2019-01-19 ENCOUNTER — Other Ambulatory Visit: Payer: Self-pay | Admitting: Internal Medicine

## 2019-01-19 DIAGNOSIS — E119 Type 2 diabetes mellitus without complications: Secondary | ICD-10-CM

## 2019-03-20 ENCOUNTER — Other Ambulatory Visit: Payer: Self-pay

## 2019-03-20 MED ORDER — LISINOPRIL 5 MG PO TABS: 5.0000 mg | ORAL_TABLET | Freq: Every day | ORAL | 0 refills | Status: DC

## 2019-03-21 DIAGNOSIS — Z23 Encounter for immunization: Secondary | ICD-10-CM | POA: Diagnosis not present

## 2019-04-15 ENCOUNTER — Ambulatory Visit (INDEPENDENT_AMBULATORY_CARE_PROVIDER_SITE_OTHER): Payer: Medicare Other

## 2019-04-15 VITALS — Temp 98.6°F | Ht 64.0 in | Wt 168.0 lb

## 2019-04-15 DIAGNOSIS — Z0000 Encounter for general adult medical examination without abnormal findings: Secondary | ICD-10-CM | POA: Diagnosis not present

## 2019-04-15 NOTE — Patient Instructions (Signed)
Penny Andrews , Thank you for taking time to come for your Medicare Wellness Visit. I appreciate your ongoing commitment to your health goals. Please review the following plan we discussed and let me know if I can assist you in the future.   Screening recommendations/referrals: Colonoscopy: no longer required Mammogram: no longer required Bone Density: no longer required Recommended yearly ophthalmology/optometry visit for glaucoma screening and checkup Recommended yearly dental visit for hygiene and checkup  Vaccinations: Influenza vaccine: done 03/21/19 Pneumococcal vaccine: done 03/22/16 Tdap vaccine: done 06/29/11 Shingles vaccine: Shingrix discussed. Please contact your pharmacy for coverage information.   Advanced directives: Please bring a copy of your health care power of attorney and living will to the office at your convenience.  Conditions/risks identified: Recommend drinking 6-8 glasses of water per day  Next appointment: Please follow up in one year for your Medicare Annual Wellness visit.     Preventive Care 83 Years and Older, Female Preventive care refers to lifestyle choices and visits with your health care provider that can promote health and wellness. What does preventive care include?  A yearly physical exam. This is also called an annual well check.  Dental exams once or twice a year.  Routine eye exams. Ask your health care provider how often you should have your eyes checked.  Personal lifestyle choices, including:  Daily care of your teeth and gums.  Regular physical activity.  Eating a healthy diet.  Avoiding tobacco and drug use.  Limiting alcohol use.  Practicing safe sex.  Taking low-dose aspirin every day.  Taking vitamin and mineral supplements as recommended by your health care provider. What happens during an annual well check? The services and screenings done by your health care provider during your annual well check will depend on your  age, overall health, lifestyle risk factors, and family history of disease. Counseling  Your health care provider may ask you questions about your:  Alcohol use.  Tobacco use.  Drug use.  Emotional well-being.  Home and relationship well-being.  Sexual activity.  Eating habits.  History of falls.  Memory and ability to understand (cognition).  Work and work Statistician.  Reproductive health. Screening  You may have the following tests or measurements:  Height, weight, and BMI.  Blood pressure.  Lipid and cholesterol levels. These may be checked every 5 years, or more frequently if you are over 44 years old.  Skin check.  Lung cancer screening. You may have this screening every year starting at age 59 if you have a 30-pack-year history of smoking and currently smoke or have quit within the past 15 years.  Fecal occult blood test (FOBT) of the stool. You may have this test every year starting at age 32.  Flexible sigmoidoscopy or colonoscopy. You may have a sigmoidoscopy every 5 years or a colonoscopy every 10 years starting at age 29.  Hepatitis C blood test.  Hepatitis B blood test.  Sexually transmitted disease (STD) testing.  Diabetes screening. This is done by checking your blood sugar (glucose) after you have not eaten for a while (fasting). You may have this done every 1-3 years.  Bone density scan. This is done to screen for osteoporosis. You may have this done starting at age 14.  Mammogram. This may be done every 1-2 years. Talk to your health care provider about how often you should have regular mammograms. Talk with your health care provider about your test results, treatment options, and if necessary, the need for more tests.  Vaccines  Your health care provider may recommend certain vaccines, such as:  Influenza vaccine. This is recommended every year.  Tetanus, diphtheria, and acellular pertussis (Tdap, Td) vaccine. You may need a Td booster  every 10 years.  Zoster vaccine. You may need this after age 45.  Pneumococcal 13-valent conjugate (PCV13) vaccine. One dose is recommended after age 84.  Pneumococcal polysaccharide (PPSV23) vaccine. One dose is recommended after age 36. Talk to your health care provider about which screenings and vaccines you need and how often you need them. This information is not intended to replace advice given to you by your health care provider. Make sure you discuss any questions you have with your health care provider. Document Released: 07/03/2015 Document Revised: 02/24/2016 Document Reviewed: 04/07/2015 Elsevier Interactive Patient Education  2017 [place] Prevention in the Home Falls can cause injuries. They can happen to people of all ages. There are many things you can do to make your home safe and to help prevent falls. What can I do on the outside of my home?  Regularly fix the edges of walkways and driveways and fix any cracks.  Remove anything that might make you trip as you walk through a door, such as a raised step or threshold.  Trim any bushes or trees on the path to your home.  Use bright outdoor lighting.  Clear any walking paths of anything that might make someone trip, such as rocks or tools.  Regularly check to see if handrails are loose or broken. Make sure that both sides of any steps have handrails.  Any raised decks and porches should have guardrails on the edges.  Have any leaves, snow, or ice cleared regularly.  Use sand or salt on walking paths during winter.  Clean up any spills in your garage right away. This includes oil or grease spills. What can I do in the bathroom?  Use night lights.  Install grab bars by the toilet and in the tub and shower. Do not use towel bars as grab bars.  Use non-skid mats or decals in the tub or shower.  If you need to sit down in the shower, use a plastic, non-slip stool.  Keep the floor dry. Clean up any  water that spills on the floor as soon as it happens.  Remove soap buildup in the tub or shower regularly.  Attach bath mats securely with double-sided non-slip rug tape.  Do not have throw rugs and other things on the floor that can make you trip. What can I do in the bedroom?  Use night lights.  Make sure that you have a light by your bed that is easy to reach.  Do not use any sheets or blankets that are too big for your bed. They should not hang down onto the floor.  Have a firm chair that has side arms. You can use this for support while you get dressed.  Do not have throw rugs and other things on the floor that can make you trip. What can I do in the kitchen?  Clean up any spills right away.  Avoid walking on wet floors.  Keep items that you use a lot in easy-to-reach places.  If you need to reach something above you, use a strong step stool that has a grab bar.  Keep electrical cords out of the way.  Do not use floor polish or wax that makes floors slippery. If you must use wax, use non-skid floor wax.  Do not have throw rugs and other things on the floor that can make you trip. What can I do with my stairs?  Do not leave any items on the stairs.  Make sure that there are handrails on both sides of the stairs and use them. Fix handrails that are broken or loose. Make sure that handrails are as long as the stairways.  Check any carpeting to make sure that it is firmly attached to the stairs. Fix any carpet that is loose or worn.  Avoid having throw rugs at the top or bottom of the stairs. If you do have throw rugs, attach them to the floor with carpet tape.  Make sure that you have a light switch at the top of the stairs and the bottom of the stairs. If you do not have them, ask someone to add them for you. What else can I do to help prevent falls?  Wear shoes that:  Do not have high heels.  Have rubber bottoms.  Are comfortable and fit you well.  Are closed  at the toe. Do not wear sandals.  If you use a stepladder:  Make sure that it is fully opened. Do not climb a closed stepladder.  Make sure that both sides of the stepladder are locked into place.  Ask someone to hold it for you, if possible.  Clearly mark and make sure that you can see:  Any grab bars or handrails.  First and last steps.  Where the edge of each step is.  Use tools that help you move around (mobility aids) if they are needed. These include:  Canes.  Walkers.  Scooters.  Crutches.  Turn on the lights when you go into a dark area. Replace any light bulbs as soon as they burn out.  Set up your furniture so you have a clear path. Avoid moving your furniture around.  If any of your floors are uneven, fix them.  If there are any pets around you, be aware of where they are.  Review your medicines with your doctor. Some medicines can make you feel dizzy. This can increase your chance of falling. Ask your doctor what other things that you can do to help prevent falls. This information is not intended to replace advice given to you by your health care provider. Make sure you discuss any questions you have with your health care provider. Document Released: 04/02/2009 Document Revised: 11/12/2015 Document Reviewed: 07/11/2014 Elsevier Interactive Patient Education  2017 [employer].

## 2019-04-15 NOTE — Progress Notes (Signed)
Subjective:   Penny Andrews is a 85 y.o. female who presents for Medicare Annual (Subsequent) preventive examination.  Virtual Visit via Telephone Note  I connected with Allean Found on 04/15/19 at  9:20 AM EDT by telephone and verified that I am speaking with the correct person using two identifiers.  Medicare Annual Wellness visit completed telephonically due to Covid-19 pandemic.   Location: Patient: home Provider: office   I discussed the limitations, risks, security and privacy concerns of performing an evaluation and management service by telephone and the availability of in person appointments. The patient expressed understanding and agreed to proceed.  Some vital signs may be absent or patient reported.   Clemetine Marker, LPN    Review of Systems:   Cardiac Risk Factors include: advanced age (>56mn, >>5women);diabetes mellitus;dyslipidemia;hypertension     Objective:     Vitals: Temp 98.6 F (37 C)    Ht '5\' 4"'  (1.626 m)    Wt 168 lb (76.2 kg)    BMI 28.84 kg/m   Body mass index is 28.84 kg/m.  Advanced Directives 04/15/2019 03/28/2018 11/04/2014  Does Patient Have a Medical Advance Directive? Yes Yes Yes  Type of [place] Living will  Copy of [place] Chart? No - copy requested No - copy requested No - copy requested    Tobacco Social History   Tobacco Use  Smoking Status Never Smoker  Smokeless Tobacco Never Used  Tobacco Comment   smoking cessation materials not required     Counseling given: Not Answered Comment: smoking cessation materials not required   Clinical Intake:  Pre-visit preparation completed: Yes  Pain : No/denies pain     BMI - recorded: 28.84 Nutritional Status: BMI 25 -29 Overweight Nutritional Risks: None Diabetes: Yes CBG done?: No Did pt. bring in CBG monitor from home?: No   Nutrition Risk  Assessment:  Has the patient had any N/V/D within the last 2 months?  No  Does the patient have any non-healing wounds?  No  Has the patient had any unintentional weight loss or weight gain?  No   Diabetes:  Is the patient diabetic?  Yes  If diabetic, was a CBG obtained today?  No  Did the patient bring in their glucometer from home?  No  How often do you monitor your CBG's? Every other day.   Financial Strains and Diabetes Management:  Are you having any financial strains with the device, your supplies or your medication? No .  Does the patient want to be seen by Chronic Care Management for management of their diabetes?  No  Would the patient like to be referred to a Nutritionist or for Diabetic Management?  No   Diabetic Exams:  Diabetic Eye Exam: Completed 03/22/18. Overdue for diabetic eye exam. Pt has been advised about the importance in completing this exam.  Diabetic Foot Exam: Completed 08/17/18.   How often do you need to have someone help you when you read instructions, pamphlets, or other written materials from your doctor or pharmacy?: 1 - Never  Interpreter Needed?: No  Information entered by :: KClemetine MarkerLPN  Past Medical History:  Diagnosis Date   Diabetes mellitus without complication ([place]    Patient reported   GERD (gastroesophageal reflux disease)    Hypertension    Past Surgical History:  Procedure Laterality Date   ESOPHAGOGASTRODUODENOSCOPY  07/2014   grade D esophagitis; prepyloric  ulcer   EYE SURGERY Bilateral    cataracts   TONSILLECTOMY     UMBILICAL HERNIA REPAIR     Family History  Problem Relation Age of Onset   CAD Father    Cancer Mother    Social History   Socioeconomic History   Marital status: Widowed    Spouse name: Not on file   Number of children: 4   Years of education: Not on file   Highest education level: 12th grade  Occupational History   Occupation: Retired  Scientist, product/process development  strain: Not hard at [employer] insecurity    Worry: Never true    Inability: Never true   Transportation needs    Medical: No    Non-medical: No  Tobacco Use   Smoking status: Never Smoker   Smokeless tobacco: Never Used   Tobacco comment: smoking cessation materials not required  Substance and Sexual Activity   Alcohol use: Yes    Alcohol/week: 0.0 standard drinks    Comment: occasionally once a year   Drug use: No   Sexual activity: Not Currently  Lifestyle   Physical activity    Days per week: 0 days    Minutes per session: 0 min   Stress: Not at all  Relationships   Social connections    Talks on phone: Patient refused    Gets together: Patient refused    Attends religious service: Patient refused    Active member of club or organization: Patient refused    Attends meetings of clubs or organizations: Patient refused    Relationship status: Married  Other Topics Concern   Not on file  Social History Narrative   Not on file    Outpatient Encounter Medications as of 04/15/2019  Medication Sig   ACCU-CHEK SOFTCLIX LANCETS lancets    aspirin 81 MG tablet Take 81 mg by mouth every evening.   Blood Glucose Monitoring Suppl (ACCU-CHEK AVIVA PLUS) W/DEVICE KIT U UTD   cholecalciferol (VITAMIN D) 1000 UNITS tablet Take 1,000 Units by mouth daily.   glipiZIDE (GLUCOTROL) 5 MG tablet TAKE 1 TABLET(5 MG) BY MOUTH TWICE DAILY BEFORE A MEAL   glucose blood test strip    lisinopril (ZESTRIL) 5 MG tablet Take 1 tablet (5 mg total) by mouth daily.   lovastatin (MEVACOR) 40 MG tablet TAKE 1 TABLET BY MOUTH EVERY EVENING   metFORMIN (GLUCOPHAGE-XR) 750 MG 24 hr tablet TAKE 1 TABLET BY MOUTH TWICE DAILY   Multiple Vitamin (MULTIVITAMIN WITH MINERALS) TABS tablet Take 1 tablet by mouth daily.   pantoprazole (PROTONIX) 40 MG tablet TAKE 1 TABLET(40 MG) BY MOUTH DAILY   vitamin C (ASCORBIC ACID) 500 MG tablet Take by mouth.   No facility-administered encounter  medications on file as of 04/15/2019.     Activities of Daily Living In your present state of health, do you have any difficulty performing the following activities: 04/15/2019  Hearing? Y  Comment wears hearing aids  Vision? N  Difficulty concentrating or making decisions? N  Walking or climbing stairs? N  Dressing or bathing? N  Doing errands, shopping? N  Preparing Food and eating ? N  Using the Toilet? N  In the past six months, have you accidently leaked urine? N  Do you have problems with loss of bowel control? N  Managing your Medications? N  Managing your Finances? N  Housekeeping or managing your Housekeeping? N  Some recent data might be hidden  Patient Care Team: Glean Hess, MD as PCP - General (Internal Medicine) Lollie Sails, MD as Consulting Physician (Gastroenterology) Ubaldo Glassing Javier Docker, MD as Consulting Physician (Cardiology)    Assessment:   This is a routine wellness examination for [place].  Exercise Activities and Dietary recommendations Current Exercise Habits: The patient does not participate in regular exercise at present, Exercise limited by: None identified  Goals     DIET - EAT MORE FRUITS AND VEGETABLES     Recommend to eat 3 small healthy meals and at least 2 healthy snacks per day.     DIET - INCREASE WATER INTAKE     Recommend drinking 6-8 glasses of water per day       Fall Risk Fall Risk  04/15/2019 08/17/2018 05/07/2018 03/28/2018 03/23/2017  Falls in the past year? 0 0 0 No No  Comment - - Emmi Telephone Survey: data to providers prior to load - -  Number falls in past yr: 0 0 - - -  Injury with Fall? 0 0 - - -  Risk for fall due to : - - - Impaired vision -  Risk for fall due to: Comment - - - wears eyeglasses -  Follow up Falls prevention discussed Falls evaluation completed - - -   FALL RISK PREVENTION PERTAINING TO THE HOME:  Any stairs in or around the home? Yes  If so, do they handrails? Yes   Home free of  loose throw rugs in walkways, pet beds, electrical cords, etc? Yes  Adequate lighting in your home to reduce risk of falls? Yes   ASSISTIVE DEVICES UTILIZED TO PREVENT FALLS:  Life alert? No  Use of a cane, walker or w/c? No  Grab bars in the bathroom? Yes  Shower chair or bench in shower? Yes  Elevated toilet seat or a handicapped toilet? Yes   DME ORDERS:  DME order needed?  No   TIMED UP AND GO:  Was the test performed? No . Telephonic visit.   Education: Fall risk prevention has been discussed.  Intervention(s) required? No   Depression Screen PHQ 2/9 Scores 04/15/2019 08/17/2018 03/28/2018 03/23/2017  PHQ - 2 Score 0 0 0 0  PHQ- 9 Score - - 0 -     Cognitive Function     6CIT Screen 04/15/2019 03/28/2018 03/23/2017  What Year? 0 points 0 points 0 points  What month? 0 points 0 points 0 points  What time? 0 points 0 points 0 points  Count back from 20 0 points 0 points 0 points  Months in reverse 0 points 0 points 0 points  Repeat phrase 8 points 8 points 4 points  Total Score '8 8 4    ' Immunization History  Administered Date(s) Administered   Fluad Quad(high Dose 65+) 03/21/2019   Influenza, High Dose Seasonal PF 03/23/2017, 03/28/2018   Influenza-Unspecified 03/14/2016   Pneumococcal Conjugate-13 07/29/2013, 03/22/2016   Pneumococcal Polysaccharide-23 03/07/2006   Pneumococcal-Unspecified 06/20/2012   Tdap 06/29/2011   Zoster 12/03/2006    Qualifies for Shingles Vaccine? Yes  Zostavax completed 2008. Due for Shingrix. Education has been provided regarding the importance of this vaccine. Pt has been advised to call insurance company to determine out of pocket expense. Advised may also receive vaccine at local pharmacy or Health Dept. Verbalized acceptance and understanding.  Tdap: Up to date  Flu Vaccine: Up to date  Pneumococcal Vaccine: Up to date   Screening Tests Health Maintenance  Topic Date Due   HEMOGLOBIN A1C  02/15/2019    OPHTHALMOLOGY EXAM  03/21/2019   FOOT EXAM  08/18/2019   TETANUS/TDAP  06/28/2021   INFLUENZA VACCINE  Completed   PNA vac Low Risk Adult  Completed    Cancer Screenings:  Colorectal Screening: No longer required.   Mammogram:  No longer required.   Bone Density: No longer required.   Lung Cancer Screening: (Low Dose CT Chest recommended if Age 30-80 years, 30 pack-year currently smoking OR have quit w/in 15years.) does not qualify.   Additional Screening:  Hepatitis C Screening: no longer required   Vision Screening: Recommended annual ophthalmology exams for early detection of glaucoma and other disorders of the eye. Is the patient up to date with their annual eye exam?  Yes  Who is the provider or what is the name of the office in which the pt attends annual eye exams? [place] Screening: Recommended annual dental exams for proper oral hygiene  Community Resource Referral:  CRR required this visit?  No      Plan:     I have personally reviewed and addressed the Medicare Annual Wellness questionnaire and have noted the following in the patients chart:  A. Medical and social history B. Use of alcohol, tobacco or illicit drugs  C. Current medications and supplements D. Functional ability and status E.  Nutritional status F.  Physical activity G. Advance directives H. List of other physicians I.  Hospitalizations, surgeries, and ER visits in previous 12 months J.  [place] such as hearing and vision if needed, cognitive and depression L. Referrals and appointments   In addition, I have reviewed and discussed with patient certain preventive protocols, quality metrics, and best practice recommendations. A written personalized care plan for preventive services as well as general preventive health recommendations were provided to patient.   Signed,  Clemetine Marker, LPN Nurse Health Advisor   Nurse Notes: pt doing well and  appreciative of visit today

## 2019-04-23 ENCOUNTER — Other Ambulatory Visit: Payer: Self-pay | Admitting: Internal Medicine

## 2019-04-23 DIAGNOSIS — E119 Type 2 diabetes mellitus without complications: Secondary | ICD-10-CM

## 2019-04-25 ENCOUNTER — Encounter: Payer: Self-pay | Admitting: Internal Medicine

## 2019-04-26 ENCOUNTER — Ambulatory Visit (INDEPENDENT_AMBULATORY_CARE_PROVIDER_SITE_OTHER): Payer: Medicare Other | Admitting: Internal Medicine

## 2019-04-26 ENCOUNTER — Other Ambulatory Visit: Payer: Self-pay

## 2019-04-26 ENCOUNTER — Encounter: Payer: Self-pay | Admitting: Internal Medicine

## 2019-04-26 VITALS — BP 120/68 | HR 71 | Ht 64.0 in | Wt 170.0 lb

## 2019-04-26 DIAGNOSIS — E1169 Type 2 diabetes mellitus with other specified complication: Secondary | ICD-10-CM | POA: Diagnosis not present

## 2019-04-26 DIAGNOSIS — I10 Essential (primary) hypertension: Secondary | ICD-10-CM

## 2019-04-26 DIAGNOSIS — Z8719 Personal history of other diseases of the digestive system: Secondary | ICD-10-CM

## 2019-04-26 DIAGNOSIS — E118 Type 2 diabetes mellitus with unspecified complications: Secondary | ICD-10-CM

## 2019-04-26 DIAGNOSIS — E785 Hyperlipidemia, unspecified: Secondary | ICD-10-CM | POA: Diagnosis not present

## 2019-04-26 DIAGNOSIS — Z8711 Personal history of peptic ulcer disease: Secondary | ICD-10-CM

## 2019-04-26 MED ORDER — LOVASTATIN 40 MG PO TABS: 40.0000 mg | ORAL_TABLET | Freq: Every evening | ORAL | 3 refills | Status: DC

## 2019-04-26 MED ORDER — METFORMIN HCL ER 750 MG PO TB24: 750.0000 mg | ORAL_TABLET | Freq: Two times a day (BID) | ORAL | 3 refills | Status: DC

## 2019-04-26 MED ORDER — LISINOPRIL 5 MG PO TABS: 5.0000 mg | ORAL_TABLET | Freq: Every day | ORAL | 0 refills | Status: DC

## 2019-04-26 NOTE — Progress Notes (Signed)
Date:  04/26/2019   Name:  Penny Andrews   DOB:  [birth year]   MRN:  [id number]   Chief Complaint: Annual Exam (Breast Exam. Already had flu shot. ), Hypertension, and Diabetes Penny Andrews is a 85 y.o. female who presents today for her annual exam. She feels well. She reports exercising walking daily for 10 minutes. She reports she is sleeping well. She denies breast issues and declines breast exam.  Mammogram - aged out Immunizations - UTD  Diabetes She presents for her follow-up diabetic visit. She has type 2 diabetes mellitus. Her disease course has been stable. Pertinent negatives for hypoglycemia include no dizziness, headaches, nervousness/anxiousness or tremors. Pertinent negatives for diabetes include no chest pain, no fatigue, no polydipsia and no polyuria. Current diabetic treatment includes oral agent (dual therapy) (metformin and glipizide). She is following a generally healthy diet. She monitors blood glucose at home 1-2 x per week. There is no change in her home blood glucose trend. Her breakfast blood glucose is taken between 6-7 am. Her breakfast blood glucose range is generally 110-130 mg/dl. An ACE inhibitor/angiotensin II receptor blocker is being taken. She does not see a podiatrist.Eye exam is not current.  Hypertension This is a chronic problem. The problem is controlled. Pertinent negatives include no chest pain, headaches, palpitations or shortness of breath. Past treatments include ACE inhibitors.  Hyperlipidemia This is a chronic problem. The problem is controlled. Pertinent negatives include no chest pain or shortness of breath. Current antihyperlipidemic treatment includes statins. The current treatment provides significant improvement of lipids. There are no compliance problems.   Gastroesophageal Reflux She reports no abdominal pain, no chest pain, no coughing, no dysphagia, no heartburn, no hoarse voice, no sore throat or no wheezing. The problem occurs rarely.  Pertinent negatives include no fatigue. She has tried a PPI for the symptoms. The treatment provided significant relief.   Lab Results  Component Value Date   HGBA1C 6.5 (H) 08/17/2018   Lab Results  Component Value Date   CREATININE 0.82 08/17/2018   BUN 15 08/17/2018   NA 143 08/17/2018   K 4.7 08/17/2018   CL 103 08/17/2018   CO2 23 08/17/2018   Lab Results  Component Value Date   CHOL 177 04/06/2018   HDL 51 04/06/2018   LDLCALC 96 04/06/2018   TRIG 152 (H) 04/06/2018   CHOLHDL 3.5 04/06/2018   Lab Results  Component Value Date   ALT 26 04/06/2018   AST 32 04/06/2018   ALKPHOS 66 04/06/2018   BILITOT 0.5 04/06/2018     Review of Systems  Constitutional: Negative for chills, fatigue and fever.  HENT: Negative for congestion, hearing loss, hoarse voice, sore throat, tinnitus, trouble swallowing and voice change.   Eyes: Negative for visual disturbance.  Respiratory: Negative for cough, chest tightness, shortness of breath and wheezing.   Cardiovascular: Negative for chest pain, palpitations and leg swelling.  Gastrointestinal: Negative for abdominal pain, constipation, diarrhea, dysphagia, heartburn and vomiting.  Endocrine: Negative for polydipsia and polyuria.  Genitourinary: Negative for dysuria, frequency, genital sores, vaginal bleeding and vaginal discharge.  Musculoskeletal: Negative for arthralgias, gait problem and joint swelling.  Skin: Negative for color change and rash.  Neurological: Negative for dizziness, tremors, light-headedness and headaches.  Hematological: Negative for adenopathy. Does not bruise/bleed easily.  Psychiatric/Behavioral: Negative for dysphoric mood and sleep disturbance. The patient is not nervous/anxious.     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   Essential hypertension 10/31/2014   Hyperlipidemia associated  with type 2 diabetes mellitus ([place]) 10/31/2014   Hx of gastric ulcer 10/31/2014   Has a tremor 10/31/2014   Avitaminosis D 10/31/2014   Type II diabetes mellitus with complication (HCC) 63/78/5885    Allergies  Allergen Reactions   Penicillins Swelling    Past Surgical History:  Procedure Laterality Date   ESOPHAGOGASTRODUODENOSCOPY  07/2014   grade D esophagitis; prepyloric ulcer   EYE SURGERY Bilateral    cataracts   TONSILLECTOMY     UMBILICAL HERNIA REPAIR      Social History   Tobacco Use   Smoking status: Never Smoker   Smokeless tobacco: Never Used   Tobacco comment: smoking cessation materials not required  Substance Use Topics   Alcohol use: Yes    Alcohol/week: 0.0 standard drinks    Comment: occasionally once a year   Drug use: No     Medication list has been reviewed and updated.  Current Meds  Medication Sig   ACCU-CHEK SOFTCLIX LANCETS lancets    aspirin 81 MG tablet Take 81 mg by mouth every evening.   Blood Glucose Monitoring Suppl (ACCU-CHEK AVIVA PLUS) W/DEVICE KIT U UTD   cholecalciferol (VITAMIN D) 1000 UNITS tablet Take 1,000 Units by mouth daily.   glipiZIDE (GLUCOTROL) 5 MG tablet TAKE 1 TABLET(5 MG) BY MOUTH TWICE DAILY BEFORE A MEAL   glucose blood test strip    lisinopril (ZESTRIL) 5 MG tablet Take 1 tablet (5 mg total) by mouth daily.   lovastatin (MEVACOR) 40 MG tablet TAKE 1 TABLET BY MOUTH EVERY EVENING   metFORMIN (GLUCOPHAGE-XR) 750 MG 24 hr tablet TAKE 1 TABLET BY MOUTH TWICE DAILY   Multiple Vitamin (MULTIVITAMIN WITH MINERALS) TABS tablet Take 1 tablet by mouth daily.   pantoprazole (PROTONIX) 40 MG tablet TAKE 1 TABLET(40 MG) BY MOUTH DAILY   vitamin C (ASCORBIC ACID) 500 MG tablet Take by mouth.    PHQ 2/9 Scores 04/26/2019 04/15/2019 08/17/2018 03/28/2018  PHQ - 2 Score 0 0 0 0  PHQ- 9 Score - - - 0    BP Readings from Last 3 Encounters:  04/26/19 120/68  08/17/18 112/68  04/06/18 134/78     Physical Exam Vitals signs and nursing note reviewed.  Constitutional:      General: She is not in acute distress.    Appearance: She is well-developed.  HENT:     Head: Normocephalic and atraumatic.     Right Ear: Tympanic membrane and ear canal normal.     Left Ear: Tympanic membrane and ear canal normal.     Nose:     Right Sinus: No maxillary sinus tenderness.     Left Sinus: No maxillary sinus tenderness.  Eyes:     General: No scleral icterus.       Right eye: No discharge.        Left eye: No discharge.     Conjunctiva/sclera: Conjunctivae normal.  Neck:     Musculoskeletal: Normal range of motion. No erythema.     Thyroid: No thyromegaly.     Vascular: No carotid bruit.  Cardiovascular:     Rate and Rhythm: Normal rate and regular rhythm.     Pulses: Normal pulses.     Heart sounds: Normal heart sounds.  Pulmonary:     Effort: Pulmonary effort is normal. No respiratory distress.     Breath sounds:  No wheezing.  Chest:     Comments: Exam declined by patient Abdominal:     General: Bowel sounds are normal.     Palpations: Abdomen is soft.     Tenderness: There is no abdominal tenderness.  Musculoskeletal: Normal range of motion.     Right lower leg: No edema.     Left lower leg: No edema.  Lymphadenopathy:     Cervical: No cervical adenopathy.  Skin:    General: Skin is warm and dry.     Capillary Refill: Capillary refill takes less than 2 seconds.     Findings: No rash.  Neurological:     General: No focal deficit present.     Mental Status: She is alert and oriented to person, place, and time.     Cranial Nerves: No cranial nerve deficit.     Sensory: No sensory deficit.     Deep Tendon Reflexes: Reflexes are normal and symmetric.  Psychiatric:        Speech: Speech normal.        Behavior: Behavior normal.        Thought Content: Thought content normal.     Wt Readings from Last 3 Encounters:  04/26/19 170 lb (77.1 kg)  04/15/19 168 lb (76.2  kg)  08/17/18 176 lb (79.8 kg)    BP 120/68    Pulse 71    Ht '5\' 4"'  (1.626 m)    Wt 170 lb (77.1 kg)    SpO2 96%    BMI 29.18 kg/m   Assessment and Plan: 1. Essential hypertension Clinically stable exam with well controlled BP.   Tolerating medications, lisinopril 5 mg, without side effects at this time. Pt to continue current regimen and low sodium diet; benefits of regular exercise as able discussed. - TSH - lisinopril (ZESTRIL) 5 MG tablet; Take 1 tablet (5 mg total) by mouth daily.  Dispense: 90 tablet; Refill: 0  2. Type II diabetes mellitus with complication (HCC) Clinically stable by exam and report without s/s of hypoglycemia. DM complicated by HTN, lipids. Tolerating medications Metformin 750 mg bid and glipizide 5 mg bid well without side effects or other concerns. Overdue for DM eye exam - pt will schedule - Comprehensive metabolic panel - Hemoglobin A1c - Ambulatory referral to Podiatry - metFORMIN (GLUCOPHAGE-XR) 750 MG 24 hr tablet; Take 1 tablet (750 mg total) by mouth 2 (two) times daily.  Dispense: 180 tablet; Refill: 3  3. Hyperlipidemia associated with type 2 diabetes mellitus (HCC) Tolerating moderate intensity statin medication without side effects at this time LDL is not quite at goal of < 70 on current dose Continue same therapy without change at this time. - Lipid panel - lovastatin (MEVACOR) 40 MG tablet; Take 1 tablet (40 mg total) by mouth every evening.  Dispense: 90 tablet; Refill: 3  4. Hx of gastric ulcer No s/s of recurrent and without red flag signs Continue daily PPI with Pantoprazole - CBC with Differential/Platelet   Partially dictated using Editor, commissioning. Any errors are unintentional.  Halina Maidens, MD [facility]  04/26/2019

## 2019-04-27 LAB — LIPID PANEL
Chol/HDL Ratio: 3.1 ratio (ref 0.0–4.4)
Cholesterol, Total: 158 mg/dL (ref 100–199)
HDL: 51 mg/dL (ref >39)
LDL Chol Calc (NIH): 85 mg/dL (ref 0–99)
Triglycerides: 125 mg/dL (ref 0–149)
VLDL Cholesterol Cal: 22 mg/dL (ref 5–40)

## 2019-04-27 LAB — CBC WITH DIFFERENTIAL/PLATELET
Basophils Absolute: 0 10*3/uL (ref 0.0–0.2)
Basos: 1 %
EOS (ABSOLUTE): 0.3 10*3/uL (ref 0.0–0.4)
Eos: 4 %
Hematocrit: 43.2 % (ref 34.0–46.6)
Hemoglobin: 13.8 g/dL (ref 11.1–15.9)
Immature Grans (Abs): 0 10*3/uL (ref 0.0–0.1)
Immature Granulocytes: 0 %
Lymphocytes Absolute: 2 10*3/uL (ref 0.7–3.1)
Lymphs: 33 %
MCH: 27.3 pg (ref 26.6–33.0)
MCHC: 31.9 g/dL (ref 31.5–35.7)
MCV: 86 fL (ref 79–97)
Monocytes Absolute: 0.5 10*3/uL (ref 0.1–0.9)
Monocytes: 8 %
Neutrophils Absolute: 3.4 10*3/uL (ref 1.4–7.0)
Neutrophils: 54 %
Platelets: 201 10*3/uL (ref 150–450)
RBC: 5.05 x10E6/uL (ref 3.77–5.28)
RDW: 13.3 % (ref 11.7–15.4)
WBC: 6.2 10*3/uL (ref 3.4–10.8)

## 2019-04-27 LAB — COMPREHENSIVE METABOLIC PANEL
ALT: 11 IU/L (ref 0–32)
AST: 22 IU/L (ref 0–40)
Albumin/Globulin Ratio: 2 (ref 1.2–2.2)
Albumin: 4.5 g/dL (ref 3.5–4.6)
Alkaline Phosphatase: 85 IU/L (ref 39–117)
BUN/Creatinine Ratio: 14 (ref 12–28)
BUN: 10 mg/dL (ref 10–36)
Bilirubin Total: 0.4 mg/dL (ref 0.0–1.2)
CO2: 24 mmol/L (ref 20–29)
Calcium: 9.2 mg/dL (ref 8.7–10.3)
Chloride: 102 mmol/L (ref 96–106)
Creatinine, Ser: 0.7 mg/dL (ref 0.57–1.00)
GFR calc Af Amer: 88 mL/min/{1.73_m2} (ref >59)
GFR calc non Af Amer: 76 mL/min/{1.73_m2} (ref >59)
Globulin, Total: 2.2 g/dL (ref 1.5–4.5)
Glucose: 72 mg/dL (ref 65–99)
Potassium: 4.3 mmol/L (ref 3.5–5.2)
Sodium: 144 mmol/L (ref 134–144)
Total Protein: 6.7 g/dL (ref 6.0–8.5)

## 2019-04-27 LAB — HEMOGLOBIN A1C
Est. average glucose Bld gHb Est-mCnc: 123 mg/dL
Hgb A1c MFr Bld: 5.9 % — ABNORMAL HIGH (ref 4.8–5.6)

## 2019-04-27 LAB — TSH: TSH: 1.48 u[IU]/mL (ref 0.450–4.500)

## 2019-05-02 ENCOUNTER — Telehealth: Payer: Self-pay

## 2019-05-02 NOTE — Telephone Encounter (Signed)
I did and there was not a significant amount of wax to explain worsening hearing.

## 2019-05-02 NOTE — Telephone Encounter (Signed)
Pt daughter Santiago Glad called and left VM. Stated her mother is having more difficulty hearing than usual. She has brand new hearing aids from last year. Has an appointment coming up with her hearing aid clinic to find out if the hearing aids are working properly but wanted to call our clinic and find out if Dr. Army Melia happened to look in her ears at her CPE this past week. Wants to know if we did, did you see an excessive amount of wax?   If not, wants to make an appt to see if she has wax build up in her ears before she goes to see the hearing aid clinic. She said they told her PCP will need to clean out ear wax if that is the cause of hearing trouble.  Please advise.

## 2019-05-03 NOTE — Telephone Encounter (Signed)
Called and left a detailed msg informing patient daughter Santiago Glad of Dr. Gaspar Cola note. Not enough wax to cause hearing issues.   Told her to call back if any further questions.

## 2019-05-06 ENCOUNTER — Encounter: Payer: Self-pay | Admitting: Internal Medicine

## 2019-05-06 ENCOUNTER — Other Ambulatory Visit: Payer: Self-pay

## 2019-05-06 ENCOUNTER — Ambulatory Visit (INDEPENDENT_AMBULATORY_CARE_PROVIDER_SITE_OTHER): Payer: Medicare Other | Admitting: Internal Medicine

## 2019-05-06 VITALS — BP 122/70 | HR 96 | Ht 64.0 in | Wt 170.0 lb

## 2019-05-06 DIAGNOSIS — H9193 Unspecified hearing loss, bilateral: Secondary | ICD-10-CM | POA: Insufficient documentation

## 2019-05-06 NOTE — Progress Notes (Signed)
Date:  05/06/2019   Name:  Penny Andrews   DOB:  1925-05-09   MRN:  [id number]   Chief Complaint: ear check (Pt daughter says her hearing is getting worse. Wants to be sure she does not have wax built up in them.)  Hearing loss is worsening.  She wants to get her hearing aids adjusted and needs to be sure that there is not wax causing a problem.  She feels well with no other complaints.  Lab Results  Component Value Date   CREATININE 0.70 04/26/2019   BUN 10 04/26/2019   NA 144 04/26/2019   K 4.3 04/26/2019   CL 102 04/26/2019   CO2 24 04/26/2019   Lab Results  Component Value Date   CHOL 158 04/26/2019   HDL 51 04/26/2019   LDLCALC 85 04/26/2019   TRIG 125 04/26/2019   CHOLHDL 3.1 04/26/2019   Lab Results  Component Value Date   TSH 1.480 04/26/2019   Lab Results  Component Value Date   HGBA1C 5.9 (H) 04/26/2019     Review of Systems  HENT: Positive for hearing loss. Negative for ear pain.     Patient Active Problem List   Diagnosis Date Noted  . Hard of hearing 01/14/2015  . Allergic rhinitis 10/31/2014  . 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 Essential hypertension 10/31/2014  . Hyperlipidemia associated with type 2 diabetes mellitus ([place]) 10/31/2014  . Hx of gastric ulcer 10/31/2014  . Has a tremor 10/31/2014  . Avitaminosis D 10/31/2014  . Type II diabetes mellitus with complication ([place]) 67/05/4579    Allergies  Allergen Reactions  . Penicillins Swelling    Past Surgical History:  Procedure Laterality Date  . ESOPHAGOGASTRODUODENOSCOPY  07/2014   grade D esophagitis; prepyloric ulcer  . EYE SURGERY Bilateral    cataracts  . TONSILLECTOMY    . UMBILICAL HERNIA REPAIR      Social History   Tobacco Use  . Smoking status: Never Smoker  . Smokeless tobacco: Never Used  . Tobacco comment: smoking cessation materials not required  Substance Use Topics  . Alcohol use: Yes    Alcohol/week: 0.0 standard drinks    Comment: occasionally  once a year  . Drug use: No     Medication list has been reviewed and updated.  Current Meds  Medication Sig  . ACCU-CHEK SOFTCLIX LANCETS lancets   . aspirin 81 MG tablet Take 81 mg by mouth every evening.  . Blood Glucose Monitoring Suppl (ACCU-CHEK AVIVA PLUS) W/DEVICE KIT U UTD  . cholecalciferol (VITAMIN D) 1000 UNITS tablet Take 1,000 Units by mouth daily.  Marland Kitchen glipiZIDE (GLUCOTROL) 5 MG tablet TAKE 1 TABLET(5 MG) BY MOUTH TWICE DAILY BEFORE A MEAL  . glucose blood test strip   . lisinopril (ZESTRIL) 5 MG tablet Take 1 tablet (5 mg total) by mouth daily.  Marland Kitchen lovastatin (MEVACOR) 40 MG tablet Take 1 tablet (40 mg total) by mouth every evening.  . metFORMIN (GLUCOPHAGE-XR) 750 MG 24 hr tablet Take 1 tablet (750 mg total) by mouth 2 (two) times daily.  . Multiple Vitamin (MULTIVITAMIN WITH MINERALS) TABS tablet Take 1 tablet by mouth daily.  . pantoprazole (PROTONIX) 40 MG tablet TAKE 1 TABLET(40 MG) BY MOUTH DAILY  . vitamin C (ASCORBIC ACID) 500 MG tablet Take by mouth.    PHQ 2/9 Scores 04/26/2019 04/15/2019 08/17/2018 03/28/2018  PHQ - 2 Score 0 0 0 0  PHQ- 9 Score - - - 0  BP Readings from Last 3 Encounters:  05/06/19 122/70  04/26/19 120/68  08/17/18 112/68    Physical Exam Vitals signs and nursing note reviewed.  Constitutional:      General: She is not in acute distress.    Appearance: She is well-developed.  HENT:     Head: Normocephalic and atraumatic.     Right Ear: Tympanic membrane, ear canal and external ear normal. Decreased hearing noted.     Left Ear: Tympanic membrane, ear canal and external ear normal. Decreased hearing noted.  Pulmonary:     Effort: Pulmonary effort is normal. No respiratory distress.  Musculoskeletal: Normal range of motion.  Skin:    General: Skin is warm and dry.     Findings: No rash.  Neurological:     Mental Status: She is alert and oriented to person, place, and time.  Psychiatric:        Behavior: Behavior normal.         Thought Content: Thought content normal.     Wt Readings from Last 3 Encounters:  05/06/19 170 lb (77.1 kg)  04/26/19 170 lb (77.1 kg)  04/15/19 168 lb (76.2 kg)    BP 122/70   Pulse 96   Ht '5\' 4"'  (1.626 m)   Wt 170 lb (77.1 kg)   SpO2 97%   BMI 29.18 kg/m   Assessment and Plan: 1. Bilateral hearing loss, unspecified hearing loss type Both ear canals are free of cerumen Pt may proceed to hearing aid specialist for additional evaluation   Partially dictated using [place]. Any errors are unintentional.  Halina Maidens, MD [facility]  05/06/2019

## 2019-05-17 ENCOUNTER — Other Ambulatory Visit: Payer: Self-pay | Admitting: Internal Medicine

## 2019-05-17 DIAGNOSIS — E118 Type 2 diabetes mellitus with unspecified complications: Secondary | ICD-10-CM

## 2019-05-20 DIAGNOSIS — B351 Tinea unguium: Secondary | ICD-10-CM | POA: Diagnosis not present

## 2019-05-20 DIAGNOSIS — M79674 Pain in right toe(s): Secondary | ICD-10-CM | POA: Diagnosis not present

## 2019-05-20 DIAGNOSIS — M79675 Pain in left toe(s): Secondary | ICD-10-CM | POA: Diagnosis not present

## 2019-06-16 ENCOUNTER — Other Ambulatory Visit: Payer: Self-pay | Admitting: Internal Medicine

## 2019-06-16 DIAGNOSIS — I10 Essential (primary) hypertension: Secondary | ICD-10-CM

## 2019-07-05 DIAGNOSIS — R002 Palpitations: Secondary | ICD-10-CM | POA: Diagnosis not present

## 2019-07-05 DIAGNOSIS — I10 Essential (primary) hypertension: Secondary | ICD-10-CM | POA: Diagnosis not present

## 2019-07-05 DIAGNOSIS — E119 Type 2 diabetes mellitus without complications: Secondary | ICD-10-CM | POA: Diagnosis not present

## 2019-07-05 DIAGNOSIS — E782 Mixed hyperlipidemia: Secondary | ICD-10-CM | POA: Diagnosis not present

## 2019-07-24 DIAGNOSIS — M79675 Pain in left toe(s): Secondary | ICD-10-CM | POA: Diagnosis not present

## 2019-07-24 DIAGNOSIS — B351 Tinea unguium: Secondary | ICD-10-CM | POA: Diagnosis not present

## 2019-07-24 DIAGNOSIS — M79674 Pain in right toe(s): Secondary | ICD-10-CM | POA: Diagnosis not present

## 2019-09-07 ENCOUNTER — Other Ambulatory Visit: Payer: Self-pay | Admitting: Internal Medicine

## 2019-09-07 DIAGNOSIS — K289 Gastrojejunal ulcer, unspecified as acute or chronic, without hemorrhage or perforation: Secondary | ICD-10-CM

## 2019-09-07 DIAGNOSIS — B9681 Helicobacter pylori [H. pylori] as the cause of diseases classified elsewhere: Secondary | ICD-10-CM

## 2019-09-30 DIAGNOSIS — L821 Other seborrheic keratosis: Secondary | ICD-10-CM | POA: Diagnosis not present

## 2019-10-24 ENCOUNTER — Encounter: Payer: Self-pay | Admitting: Internal Medicine

## 2019-10-24 ENCOUNTER — Other Ambulatory Visit: Payer: Self-pay

## 2019-10-24 ENCOUNTER — Ambulatory Visit (INDEPENDENT_AMBULATORY_CARE_PROVIDER_SITE_OTHER): Payer: Medicare Other | Admitting: Internal Medicine

## 2019-10-24 VITALS — BP 128/74 | HR 73 | Temp 96.4°F | Ht 64.0 in | Wt 168.0 lb

## 2019-10-24 DIAGNOSIS — I10 Essential (primary) hypertension: Secondary | ICD-10-CM

## 2019-10-24 DIAGNOSIS — E1169 Type 2 diabetes mellitus with other specified complication: Secondary | ICD-10-CM

## 2019-10-24 DIAGNOSIS — E785 Hyperlipidemia, unspecified: Secondary | ICD-10-CM

## 2019-10-24 DIAGNOSIS — E118 Type 2 diabetes mellitus with unspecified complications: Secondary | ICD-10-CM

## 2019-10-24 NOTE — Patient Instructions (Signed)
Covid Vaccine phone numbers: [facility] [phone] [place] [phone]  [email or web address]  

## 2019-10-24 NOTE — Progress Notes (Signed)
Date:  10/24/2019   Name:  Penny Andrews   DOB:  04-Apr-1925   MRN:  [id number]   Chief Complaint: Hypertension (6 month follow up.  Pt said she does not need her ear wax checked today because she just seen the ENT doctor.) and Diabetes  Immunization History  Administered Date(s) Administered  . Fluad Quad(high Dose 65+) 03/21/2019  . Influenza, High Dose Seasonal PF 03/23/2017, 03/28/2018  . Influenza-Unspecified 03/14/2016  . Pneumococcal Conjugate-13 07/29/2013, 03/22/2016  . Pneumococcal Polysaccharide-23 03/07/2006  . Pneumococcal-Unspecified 06/20/2012  . Tdap 06/29/2011  . Zoster 12/03/2006    Hypertension This is a chronic problem. The problem is unchanged. The problem is controlled. Pertinent negatives include no chest pain, headaches, palpitations or shortness of breath. There are no associated agents to hypertension. Risk factors for coronary artery disease include diabetes mellitus. Past treatments include ACE inhibitors. The current treatment provides significant improvement. There are no compliance problems.  There is no history of kidney disease, CAD/MI or CVA.  Diabetes She presents for her follow-up diabetic visit. She has type 2 diabetes mellitus. Her disease course has been stable. There are no hypoglycemic associated symptoms. Pertinent negatives for hypoglycemia include no headaches or tremors. Pertinent negatives for diabetes include no chest pain and no fatigue. Symptoms are stable. Pertinent negatives for diabetic complications include no CVA. Current diabetic treatment includes oral agent (monotherapy). She is compliant with treatment all of the time. She monitors blood glucose at home 3-4 x per week. Her breakfast blood glucose is taken between 7-8 am. Her breakfast blood glucose range is generally 110-130 mg/dl. An ACE inhibitor/angiotensin II receptor blocker is being taken. Eye exam is not current.  Hyperlipidemia The problem is controlled. Pertinent negatives  include no chest pain or shortness of breath. Current antihyperlipidemic treatment includes statins. The current treatment provides significant improvement of lipids. There are no compliance problems.     Lab Results  Component Value Date   CREATININE 0.70 04/26/2019   BUN 10 04/26/2019   NA 144 04/26/2019   K 4.3 04/26/2019   CL 102 04/26/2019   CO2 24 04/26/2019   Lab Results  Component Value Date   CHOL 158 04/26/2019   HDL 51 04/26/2019   LDLCALC 85 04/26/2019   TRIG 125 04/26/2019   CHOLHDL 3.1 04/26/2019   Lab Results  Component Value Date   TSH 1.480 04/26/2019   Lab Results  Component Value Date   HGBA1C 5.9 (H) 04/26/2019   Lab Results  Component Value Date   WBC 6.2 04/26/2019   HGB 13.8 04/26/2019   HCT 43.2 04/26/2019   MCV 86 04/26/2019   PLT 201 04/26/2019   Lab Results  Component Value Date   ALT 11 04/26/2019   AST 22 04/26/2019   ALKPHOS 85 04/26/2019   BILITOT 0.4 04/26/2019     Review of Systems  Constitutional: Negative for appetite change, fatigue, fever and unexpected weight change.  HENT: Positive for hearing loss. Negative for tinnitus and trouble swallowing.   Eyes: Negative for visual disturbance.  Respiratory: Negative for cough, chest tightness and shortness of breath.   Cardiovascular: Negative for chest pain, palpitations and leg swelling.  Gastrointestinal: Negative for abdominal pain, constipation and diarrhea.  Genitourinary: Negative for dysuria.  Musculoskeletal: Negative for arthralgias.  Neurological: Negative for tremors, numbness and headaches.  Hematological: Does not bruise/bleed easily.  Psychiatric/Behavioral: Negative for dysphoric mood.    Patient Active Problem List   Diagnosis Date Noted  . Bilateral  hearing loss 05/06/2019  . Allergic rhinitis 10/31/2014  . 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 Essential hypertension 10/31/2014  . Hyperlipidemia associated with type 2 diabetes mellitus ([place]) 10/31/2014    . Hx of gastric ulcer 10/31/2014  . Has a tremor 10/31/2014  . Avitaminosis D 10/31/2014  . Type II diabetes mellitus with complication ([place]) 12/14/9483    Allergies  Allergen Reactions  . Penicillins Swelling    Past Surgical History:  Procedure Laterality Date  . ESOPHAGOGASTRODUODENOSCOPY  07/2014   grade D esophagitis; prepyloric ulcer  . EYE SURGERY Bilateral    cataracts  . TONSILLECTOMY    . UMBILICAL HERNIA REPAIR      Social History   Tobacco Use  . Smoking status: Never Smoker  . Smokeless tobacco: Never Used  . Tobacco comment: smoking cessation materials not required  Substance Use Topics  . Alcohol use: Yes    Alcohol/week: 0.0 standard drinks    Comment: occasionally once a year  . Drug use: No     Medication list has been reviewed and updated.  Current Meds  Medication Sig  . ACCU-CHEK SOFTCLIX LANCETS lancets   . aspirin 81 MG tablet Take 81 mg by mouth every evening.  . Blood Glucose Monitoring Suppl (ACCU-CHEK AVIVA PLUS) W/DEVICE KIT U UTD  . cholecalciferol (VITAMIN D) 1000 UNITS tablet Take 1,000 Units by mouth daily.  Marland Kitchen glipiZIDE (GLUCOTROL) 5 MG tablet TAKE 1 TABLET(5 MG) BY MOUTH TWICE DAILY BEFORE A MEAL  . glucose blood test strip   . lisinopril (ZESTRIL) 5 MG tablet TAKE 1 TABLET(5 MG) BY MOUTH DAILY  . lovastatin (MEVACOR) 40 MG tablet Take 1 tablet (40 mg total) by mouth every evening.  . metFORMIN (GLUCOPHAGE-XR) 750 MG 24 hr tablet TAKE 1 TABLET BY MOUTH TWICE DAILY  . Multiple Vitamin (MULTIVITAMIN WITH MINERALS) TABS tablet Take 1 tablet by mouth daily.  . pantoprazole (PROTONIX) 40 MG tablet TAKE 1 TABLET(40 MG) BY MOUTH DAILY  . vitamin C (ASCORBIC ACID) 500 MG tablet Take by mouth.    PHQ 2/9 Scores 10/24/2019 04/26/2019 04/15/2019 08/17/2018  PHQ - 2 Score 0 0 0 0  PHQ- 9 Score 0 - - -    BP Readings from Last 3 Encounters:  10/24/19 128/74  05/06/19 122/70  04/26/19 120/68    Physical Exam Vitals and nursing note  reviewed.  Constitutional:      General: She is not in acute distress.    Appearance: She is well-developed.  HENT:     Head: Normocephalic and atraumatic.  Cardiovascular:     Rate and Rhythm: Normal rate and regular rhythm.     Pulses: Normal pulses.     Heart sounds: No murmur.  Pulmonary:     Effort: Pulmonary effort is normal. No respiratory distress.     Breath sounds: No wheezing or rhonchi.  Musculoskeletal:        General: Normal range of motion.     Cervical back: Normal range of motion.  Lymphadenopathy:     Cervical: No cervical adenopathy.  Skin:    General: Skin is warm and dry.     Findings: No rash.  Neurological:     Mental Status: She is alert and oriented to person, place, and time.  Psychiatric:        Behavior: Behavior normal.        Thought Content: Thought content normal.     Wt Readings from Last 3 Encounters:  10/24/19 168 lb (76.2 kg)  05/06/19 170 lb (77.1 kg)  04/26/19 170 lb (77.1 kg)    BP 128/74   Pulse 73   Temp (!) 96.4 F (35.8 C) (Temporal)   Ht '5\' 4"'  (1.626 m)   Wt 168 lb (76.2 kg)   SpO2 97%   BMI 28.84 kg/m   Assessment and Plan: 1. Essential hypertension Clinically stable exam with well controlled BP on lisinopril. Tolerating medications without side effects at this time. Pt to continue current regimen and low sodium diet; benefits of regular exercise as able discussed. - Basic metabolic panel  2. Type II diabetes mellitus with complication (HCC) Clinically stable by exam and report without s/s of hypoglycemia. DM complicated by HTN, lipids. Tolerating medications metformin bid well without side effects or other concerns.  Last A1 C was < 6.0 so considering reducing metformin to daily at supper. - Hemoglobin A1c  3. Hyperlipidemia associated with type 2 diabetes mellitus (HCC) Tolerating moderate intensity statin medication without side effects at this time LDL is almost at goal of < 70 on current dose Continue same  therapy without change at this time.    Partially dictated using Editor, commissioning. Any errors are unintentional.  Halina Maidens, MD [facility]  10/24/2019

## 2019-10-25 LAB — BASIC METABOLIC PANEL
BUN/Creatinine Ratio: 26 (ref 12–28)
BUN: 19 mg/dL (ref 10–36)
CO2: 24 mmol/L (ref 20–29)
Calcium: 9.2 mg/dL (ref 8.7–10.3)
Chloride: 105 mmol/L (ref 96–106)
Creatinine, Ser: 0.74 mg/dL (ref 0.57–1.00)
GFR calc Af Amer: 81 mL/min/{1.73_m2} (ref >59)
GFR calc non Af Amer: 71 mL/min/{1.73_m2} (ref >59)
Glucose: 89 mg/dL (ref 65–99)
Potassium: 4.7 mmol/L (ref 3.5–5.2)
Sodium: 142 mmol/L (ref 134–144)

## 2019-10-25 LAB — HEMOGLOBIN A1C
Est. average glucose Bld gHb Est-mCnc: 128 mg/dL
Hgb A1c MFr Bld: 6.1 % — ABNORMAL HIGH (ref 4.8–5.6)

## 2019-10-25 NOTE — Progress Notes (Signed)
It looks like Penny Andrews has not had a DM eye exam since 2019.  She needs to be doing this annually.

## 2019-10-28 DIAGNOSIS — Z23 Encounter for immunization: Secondary | ICD-10-CM | POA: Diagnosis not present

## 2019-11-29 DIAGNOSIS — Z23 Encounter for immunization: Secondary | ICD-10-CM | POA: Diagnosis not present

## 2020-02-17 DIAGNOSIS — M79675 Pain in left toe(s): Secondary | ICD-10-CM | POA: Diagnosis not present

## 2020-02-17 DIAGNOSIS — M79674 Pain in right toe(s): Secondary | ICD-10-CM | POA: Diagnosis not present

## 2020-02-17 DIAGNOSIS — B351 Tinea unguium: Secondary | ICD-10-CM | POA: Diagnosis not present

## 2020-04-22 ENCOUNTER — Other Ambulatory Visit: Payer: Self-pay | Admitting: Internal Medicine

## 2020-04-22 DIAGNOSIS — E118 Type 2 diabetes mellitus with unspecified complications: Secondary | ICD-10-CM

## 2020-04-22 NOTE — Telephone Encounter (Signed)
Next OV 06/30/20. Approved per protocol.  Requested Prescriptions  Pending Prescriptions Disp Refills   metFORMIN (GLUCOPHAGE-XR) 750 MG 24 hr tablet [Pharmacy Med Name: METFORMIN ER 750MG 24HR TABS] 180 tablet 3    Sig: TAKE 1 TABLET(750 MG) BY MOUTH TWICE DAILY     Endocrinology:  Diabetes - Biguanides Failed - 04/22/2020 10:46 AM      Failed - HBA1C is between 0 and 7.9 and within 180 days    Hgb A1c MFr Bld  Date Value Ref Range Status  10/24/2019 6.1 (H) 4.8 - 5.6 % Final    Comment:             Prediabetes: 5.7 - 6.4          Diabetes: >6.4          Glycemic control for adults with diabetes: <7.0          Failed - Valid encounter within last 6 months    Recent Outpatient Visits          6 months ago Essential hypertension   [facility] Glean Hess, MD   11 months ago Bilateral hearing loss, unspecified hearing loss type   [facility] Glean Hess, MD   12 months ago Essential hypertension   [facility] Glean Hess, MD   1 year ago Essential (primary) hypertension   [facility] Glean Hess, MD   2 years ago Essential (primary) hypertension   [facility] Glean Hess, MD      Future Appointments            In 1 week Glean Hess, MD [facility], [place] in normal range and within 360 days    Creatinine, Ser  Date Value Ref Range Status  10/24/2019 0.74 0.57 - 1.00 mg/dL Final         Passed - eGFR in normal range and within 360 days    GFR calc Af Amer  Date Value Ref Range Status  10/24/2019 81 >59 mL/min/1.73 Final    Comment:    **[facility] currently reports eGFR in compliance with the current**   recommendations of the [employer]. [facility] will   update reporting as new guidelines are published from the NKF-ASN   Task force.    GFR calc non Af Amer  Date Value Ref Range Status  10/24/2019 71 >59 mL/min/1.73 Final

## 2020-04-30 ENCOUNTER — Other Ambulatory Visit: Payer: Self-pay

## 2020-04-30 ENCOUNTER — Encounter: Payer: Self-pay | Admitting: Internal Medicine

## 2020-04-30 ENCOUNTER — Ambulatory Visit (INDEPENDENT_AMBULATORY_CARE_PROVIDER_SITE_OTHER): Payer: Medicare Other | Admitting: Internal Medicine

## 2020-04-30 VITALS — BP 118/70 | HR 73 | Temp 97.9°F | Ht 64.0 in | Wt 167.0 lb

## 2020-04-30 DIAGNOSIS — E118 Type 2 diabetes mellitus with unspecified complications: Secondary | ICD-10-CM

## 2020-04-30 DIAGNOSIS — I10 Essential (primary) hypertension: Secondary | ICD-10-CM

## 2020-04-30 DIAGNOSIS — Z8711 Personal history of peptic ulcer disease: Secondary | ICD-10-CM

## 2020-04-30 DIAGNOSIS — E785 Hyperlipidemia, unspecified: Secondary | ICD-10-CM

## 2020-04-30 DIAGNOSIS — E1169 Type 2 diabetes mellitus with other specified complication: Secondary | ICD-10-CM | POA: Diagnosis not present

## 2020-04-30 DIAGNOSIS — E559 Vitamin D deficiency, unspecified: Secondary | ICD-10-CM

## 2020-04-30 DIAGNOSIS — Z23 Encounter for immunization: Secondary | ICD-10-CM | POA: Diagnosis not present

## 2020-04-30 LAB — POCT URINALYSIS DIPSTICK
Bilirubin, UA: NEGATIVE
Blood, UA: NEGATIVE
Glucose, UA: NEGATIVE
Ketones, UA: NEGATIVE
Nitrite, UA: NEGATIVE
Protein, UA: NEGATIVE
Spec Grav, UA: 1.015 (ref 1.010–1.025)
Urobilinogen, UA: 0.2 E.U./dL
pH, UA: 5 (ref 5.0–8.0)

## 2020-04-30 MED ORDER — GLIPIZIDE 5 MG PO TABS: 5.0000 mg | ORAL_TABLET | Freq: Two times a day (BID) | ORAL | 3 refills | Status: DC

## 2020-04-30 MED ORDER — LOVASTATIN 40 MG PO TABS: 40.0000 mg | ORAL_TABLET | Freq: Every evening | ORAL | 3 refills | Status: DC

## 2020-04-30 NOTE — Progress Notes (Signed)
Date:  04/30/2020   Name:  Penny Andrews   DOB:  [birth year]   MRN:  [id number]   Chief Complaint: Annual Exam (Breast Exam. No pap- discontinued. High dose flu shot today. )  Penny Andrews is a 86 y.o. female who presents today for her Complete Annual Exam. She feels well. She reports exercising - none. She reports she is sleeping well. Breast complaints - none.  Mammogram: aged out DEXA: none Colonoscopy: aged out  Immunization History  Administered Date(s) Administered  . Fluad Quad(high Dose 65+) 03/21/2019  . Influenza, High Dose Seasonal PF 03/23/2017, 03/28/2018  . Influenza-Unspecified 03/14/2016  . Moderna SARS-COVID-2 Vaccination 10/28/2019  . Pneumococcal Conjugate-13 07/29/2013, 03/22/2016  . Pneumococcal Polysaccharide-23 03/07/2006  . Pneumococcal-Unspecified 06/20/2012  . Tdap 06/29/2011  . Zoster 12/03/2006    Diabetes She presents for her follow-up diabetic visit. She has type 2 diabetes mellitus. Her disease course has been stable. Pertinent negatives for hypoglycemia include no dizziness, headaches, nervousness/anxiousness or tremors. Pertinent negatives for diabetes include no chest pain, no fatigue, no polydipsia and no polyuria. Current diabetic treatment includes oral agent (dual therapy) (metformin and glucotrol). Her weight is stable. She is following a generally healthy diet. She monitors blood glucose at home 1-2 x per week. There is no change in her home blood glucose trend. An ACE inhibitor/angiotensin II receptor blocker is being taken.  Hyperlipidemia This is a chronic problem. The problem is controlled. Pertinent negatives include no chest pain or shortness of breath. Current antihyperlipidemic treatment includes statins.  Hypertension This is a chronic problem. The problem is controlled. Pertinent negatives include no chest pain, headaches, palpitations or shortness of breath. Past treatments include ACE inhibitors.  Gastroesophageal Reflux She  reports no abdominal pain, no chest pain, no coughing, no dysphagia, no heartburn, no nausea or no wheezing. The problem occurs rarely. Pertinent negatives include no fatigue. She has tried a PPI for the symptoms.    Lab Results  Component Value Date   CREATININE 0.74 10/24/2019   BUN 19 10/24/2019   NA 142 10/24/2019   K 4.7 10/24/2019   CL 105 10/24/2019   CO2 24 10/24/2019   Lab Results  Component Value Date   CHOL 158 04/26/2019   HDL 51 04/26/2019   LDLCALC 85 04/26/2019   TRIG 125 04/26/2019   CHOLHDL 3.1 04/26/2019   Lab Results  Component Value Date   TSH 1.480 04/26/2019   Lab Results  Component Value Date   HGBA1C 6.1 (H) 10/24/2019   Lab Results  Component Value Date   WBC 6.2 04/26/2019   HGB 13.8 04/26/2019   HCT 43.2 04/26/2019   MCV 86 04/26/2019   PLT 201 04/26/2019   Lab Results  Component Value Date   ALT 11 04/26/2019   AST 22 04/26/2019   ALKPHOS 85 04/26/2019   BILITOT 0.4 04/26/2019   Last vitamin D No results found for: 25OHVITD2, 25OHVITD3, VD25OH   Review of Systems  Constitutional: Negative for chills, fatigue and fever.  HENT: Negative for congestion, hearing loss, tinnitus, trouble swallowing and voice change.   Eyes: Negative for visual disturbance.  Respiratory: Negative for cough, chest tightness, shortness of breath and wheezing.   Cardiovascular: Negative for chest pain, palpitations and leg swelling.  Gastrointestinal: Negative for abdominal pain, constipation, diarrhea, dysphagia, heartburn, nausea and vomiting.  Endocrine: Negative for polydipsia and polyuria.  Genitourinary: Negative for dysuria, frequency, genital sores, vaginal bleeding and vaginal discharge.  Musculoskeletal: Negative for arthralgias, gait  problem and joint swelling.  Skin: Negative for color change and rash.  Neurological: Negative for dizziness, tremors, light-headedness and headaches.  Hematological: Negative for adenopathy. Does not bruise/bleed  easily.  Psychiatric/Behavioral: Negative for dysphoric mood and sleep disturbance. The patient is not nervous/anxious.     Patient Active Problem List   Diagnosis Date Noted  . Bilateral hearing loss 05/06/2019  . Allergic rhinitis 10/31/2014  . 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 Essential hypertension 10/31/2014  . Hyperlipidemia associated with type 2 diabetes mellitus ([place]) 10/31/2014  . Hx of gastric ulcer 10/31/2014  . Has a tremor 10/31/2014  . Avitaminosis D 10/31/2014  . Type II diabetes mellitus with complication ([place]) 31/51/7616    Allergies  Allergen Reactions  . Penicillins Swelling    Past Surgical History:  Procedure Laterality Date  . ESOPHAGOGASTRODUODENOSCOPY  07/2014   grade D esophagitis; prepyloric ulcer  . EYE SURGERY Bilateral    cataracts  . TONSILLECTOMY    . UMBILICAL HERNIA REPAIR      Social History   Tobacco Use  . Smoking status: Never Smoker  . Smokeless tobacco: Never Used  . Tobacco comment: smoking cessation materials not required  Vaping Use  . Vaping Use: Never used  Substance Use Topics  . Alcohol use: Yes    Alcohol/week: 0.0 standard drinks    Comment: occasionally once a year  . Drug use: No     Medication list has been reviewed and updated.  Current Meds  Medication Sig  . ACCU-CHEK SOFTCLIX LANCETS lancets   . aspirin 81 MG tablet Take 81 mg by mouth every evening.  . Blood Glucose Monitoring Suppl (ACCU-CHEK AVIVA PLUS) W/DEVICE KIT U UTD  . cholecalciferol (VITAMIN D) 1000 UNITS tablet Take 1,000 Units by mouth daily.  Marland Kitchen glipiZIDE (GLUCOTROL) 5 MG tablet Take 1 tablet (5 mg total) by mouth 2 (two) times daily before a meal.  . glucose blood test strip   . lisinopril (ZESTRIL) 5 MG tablet TAKE 1 TABLET(5 MG) BY MOUTH DAILY  . lovastatin (MEVACOR) 40 MG tablet Take 1 tablet (40 mg total) by mouth every evening.  . metFORMIN (GLUCOPHAGE-XR) 750 MG 24 hr tablet TAKE 1 TABLET(750 MG) BY MOUTH TWICE DAILY  .  Multiple Vitamin (MULTIVITAMIN WITH MINERALS) TABS tablet Take 1 tablet by mouth daily.  . pantoprazole (PROTONIX) 40 MG tablet TAKE 1 TABLET(40 MG) BY MOUTH DAILY  . vitamin C (ASCORBIC ACID) 500 MG tablet Take by mouth.  . [DISCONTINUED] glipiZIDE (GLUCOTROL) 5 MG tablet TAKE 1 TABLET(5 MG) BY MOUTH TWICE DAILY BEFORE A MEAL  . [DISCONTINUED] lovastatin (MEVACOR) 40 MG tablet Take 1 tablet (40 mg total) by mouth every evening.    PHQ 2/9 Scores 04/30/2020 10/24/2019 04/26/2019 04/15/2019  PHQ - 2 Score 0 0 0 0  PHQ- 9 Score 0 0 - -    GAD 7 : Generalized Anxiety Score 04/30/2020  Nervous, Anxious, on Edge 0  Control/stop worrying 0  Worry too much - different things 0  Trouble relaxing 0  Restless 0  Easily annoyed or irritable 0  Afraid - awful might happen 0  Total GAD 7 Score 0  Anxiety Difficulty Not difficult at all    BP Readings from Last 3 Encounters:  04/30/20 118/70  10/24/19 128/74  05/06/19 122/70    Physical Exam Vitals and nursing note reviewed.  Constitutional:      General: She is not in acute distress.    Appearance: Normal appearance. She is well-developed.  HENT:     Head: Normocephalic and atraumatic.     Right Ear: Tympanic membrane and ear canal normal. Decreased hearing noted.     Left Ear: Tympanic membrane and ear canal normal. Decreased hearing noted.     Ears:     Comments: Hearing aids    Nose:     Right Sinus: No maxillary sinus tenderness.     Left Sinus: No maxillary sinus tenderness.  Eyes:     General: No scleral icterus.       Right eye: No discharge.        Left eye: No discharge.     Conjunctiva/sclera: Conjunctivae normal.  Neck:     Thyroid: No thyromegaly.     Vascular: No carotid bruit.  Cardiovascular:     Rate and Rhythm: Normal rate and regular rhythm.     Pulses: Normal pulses.     Heart sounds: Normal heart sounds. No murmur heard.   Pulmonary:     Effort: Pulmonary effort is normal. No respiratory distress.      Breath sounds: No wheezing.  Chest:     Breasts:        Right: No mass, nipple discharge, skin change or tenderness.        Left: No mass, nipple discharge, skin change or tenderness.  Abdominal:     General: Bowel sounds are normal.     Palpations: Abdomen is soft.     Tenderness: There is no abdominal tenderness.  Musculoskeletal:     Cervical back: Normal range of motion. No erythema.     Right lower leg: No edema.     Left lower leg: No edema.  Lymphadenopathy:     Cervical: No cervical adenopathy.  Skin:    General: Skin is warm and dry.     Capillary Refill: Capillary refill takes less than 2 seconds.     Findings: No rash.  Neurological:     General: No focal deficit present.     Mental Status: She is alert and oriented to person, place, and time.     Cranial Nerves: No cranial nerve deficit.     Sensory: No sensory deficit.     Deep Tendon Reflexes: Reflexes are normal and symmetric.  Psychiatric:        Attention and Perception: Attention normal.        Mood and Affect: Mood normal.     Wt Readings from Last 3 Encounters:  04/30/20 167 lb (75.8 kg)  10/24/19 168 lb (76.2 kg)  05/06/19 170 lb (77.1 kg)    BP 118/70   Pulse 73   Temp 97.9 F (36.6 C) (Oral)   Ht _0  (1.626 m)   Wt 167 lb (75.8 kg)   SpO2 94%   BMI 28.67 kg/m   Assessment and Plan: 1. Essential hypertension Clinically stable exam with well controlled BP on lisinopril. Tolerating medications without side effects at this time. Pt to continue current regimen and low sodium diet; benefits of regular exercise as able discussed. - TSH  2. Type II diabetes mellitus with complication (HCC) Clinically stable by exam and report without s/s of hypoglycemia. DM complicated by HTN and lipids. Tolerating medications  well without side effects or other concerns. - Comprehensive metabolic panel - Hemoglobin A1c - POCT urinalysis dipstick - glipiZIDE (GLUCOTROL) 5 MG tablet; Take 1 tablet (5 mg  total) by mouth 2 (two) times daily before a meal.  Dispense: 180 tablet; Refill: 3  3. Hyperlipidemia associated  with type 2 diabetes mellitus ([place]) Tolerating statin medication without side effects at this time LDL is almost at goal of < 70 on current dose Continue same therapy without change at this time. - Lipid panel - lovastatin (MEVACOR) 40 MG tablet; Take 1 tablet (40 mg total) by mouth every evening.  Dispense: 90 tablet; Refill: 3  4. Avitaminosis D On daily supplement - VITAMIN D 25 Hydroxy (Vit-D Deficiency, Fractures)  5. Hx of gastric ulcer Symptoms well controlled on daily PPI No red flag signs such as weight loss, n/v, melena - CBC with Differential/Platelet  Immunizations are up to date. High Dose Flu vaccine today.  Partially dictated using Editor, commissioning. Any errors are unintentional.  Halina Maidens, MD [facility]  04/30/2020

## 2020-05-01 LAB — LIPID PANEL
Chol/HDL Ratio: 3.5 ratio (ref 0.0–4.4)
Cholesterol, Total: 181 mg/dL (ref 100–199)
HDL: 51 mg/dL (ref >39)
LDL Chol Calc (NIH): 112 mg/dL — ABNORMAL HIGH (ref 0–99)
Triglycerides: 97 mg/dL (ref 0–149)
VLDL Cholesterol Cal: 18 mg/dL (ref 5–40)

## 2020-05-01 LAB — CBC WITH DIFFERENTIAL/PLATELET
Basophils Absolute: 0.1 10*3/uL (ref 0.0–0.2)
Basos: 1 %
EOS (ABSOLUTE): 0.2 10*3/uL (ref 0.0–0.4)
Eos: 3 %
Hematocrit: 39.9 % (ref 34.0–46.6)
Hemoglobin: 13.5 g/dL (ref 11.1–15.9)
Immature Grans (Abs): 0 10*3/uL (ref 0.0–0.1)
Immature Granulocytes: 0 %
Lymphocytes Absolute: 1.8 10*3/uL (ref 0.7–3.1)
Lymphs: 31 %
MCH: 28.8 pg (ref 26.6–33.0)
MCHC: 33.8 g/dL (ref 31.5–35.7)
MCV: 85 fL (ref 79–97)
Monocytes Absolute: 0.5 10*3/uL (ref 0.1–0.9)
Monocytes: 9 %
Neutrophils Absolute: 3.2 10*3/uL (ref 1.4–7.0)
Neutrophils: 56 %
Platelets: 181 10*3/uL (ref 150–450)
RBC: 4.68 x10E6/uL (ref 3.77–5.28)
RDW: 13.1 % (ref 11.7–15.4)
WBC: 5.7 10*3/uL (ref 3.4–10.8)

## 2020-05-01 LAB — HEMOGLOBIN A1C
Est. average glucose Bld gHb Est-mCnc: 134 mg/dL
Hgb A1c MFr Bld: 6.3 % — ABNORMAL HIGH (ref 4.8–5.6)

## 2020-05-01 LAB — VITAMIN D 25 HYDROXY (VIT D DEFICIENCY, FRACTURES): Vit D, 25-Hydroxy: 42.3 ng/mL (ref 30.0–100.0)

## 2020-05-01 LAB — COMPREHENSIVE METABOLIC PANEL WITH GFR
ALT: 10 [IU]/L (ref 0–32)
AST: 19 [IU]/L (ref 0–40)
Albumin/Globulin Ratio: 1.7 (ref 1.2–2.2)
Albumin: 4.1 g/dL (ref 3.5–4.6)
Alkaline Phosphatase: 96 [IU]/L (ref 44–121)
BUN/Creatinine Ratio: 23 (ref 12–28)
BUN: 20 mg/dL (ref 10–36)
Bilirubin Total: 0.4 mg/dL (ref 0.0–1.2)
CO2: 24 mmol/L (ref 20–29)
Calcium: 9.8 mg/dL (ref 8.7–10.3)
Chloride: 101 mmol/L (ref 96–106)
Creatinine, Ser: 0.88 mg/dL (ref 0.57–1.00)
GFR calc Af Amer: 65 mL/min/{1.73_m2}
GFR calc non Af Amer: 57 mL/min/{1.73_m2} — ABNORMAL LOW
Globulin, Total: 2.4 g/dL (ref 1.5–4.5)
Glucose: 47 mg/dL — ABNORMAL LOW (ref 65–99)
Potassium: 5 mmol/L (ref 3.5–5.2)
Sodium: 138 mmol/L (ref 134–144)
Total Protein: 6.5 g/dL (ref 6.0–8.5)

## 2020-05-01 LAB — TSH: TSH: 1.24 u[IU]/mL (ref 0.450–4.500)

## 2020-05-25 ENCOUNTER — Telehealth: Payer: Self-pay

## 2020-05-25 NOTE — Telephone Encounter (Signed)
Called pt she said she is doing ok that she feels fine it just feels like she has a cold. Pt stated that she doesn't feel like she needs and infusion. Told pt that if she feels worse to give [place] a call. Also if she experiences any SOB or fever that doesn't go away in 24 hrs with using tylenol to go to the ER. Pt verbalized understanding.  KP

## 2020-05-25 NOTE — Telephone Encounter (Unsigned)
Copied from [place] [id number]. Topic: General - Inquiry >> May 25, 2020 12:15 PM Gillis Ends D wrote: Reason for CRM: Patient's daughter called and said that the patient tested positive for Covid, however she doesn't want the infusion and the daughter thinks she needs it and would like for Dr. Army Melia to call her mom and try to convince her to get it. The patient can be reached at [phone].

## 2020-06-25 ENCOUNTER — Other Ambulatory Visit: Payer: Self-pay | Admitting: Internal Medicine

## 2020-06-25 DIAGNOSIS — I10 Essential (primary) hypertension: Secondary | ICD-10-CM

## 2020-06-29 ENCOUNTER — Other Ambulatory Visit: Payer: Self-pay | Admitting: Internal Medicine

## 2020-06-29 DIAGNOSIS — E118 Type 2 diabetes mellitus with unspecified complications: Secondary | ICD-10-CM

## 2020-07-19 ENCOUNTER — Other Ambulatory Visit: Payer: Self-pay | Admitting: Internal Medicine

## 2020-07-19 DIAGNOSIS — E118 Type 2 diabetes mellitus with unspecified complications: Secondary | ICD-10-CM

## 2020-07-19 NOTE — Telephone Encounter (Signed)
Requested Prescriptions  Pending Prescriptions Disp Refills  . metFORMIN (GLUCOPHAGE-XR) 750 MG 24 hr tablet [Pharmacy Med Name: METFORMIN ER 750MG 24HR TABS] 180 tablet 0    Sig: TAKE 1 TABLET(750 MG) BY MOUTH TWICE DAILY     Endocrinology:  Diabetes - Biguanides Failed - 07/19/2020 10:25 AM      Failed - eGFR in normal range and within 360 days    GFR calc Af Amer  Date Value Ref Range Status  04/30/2020 65 >59 mL/min/1.73 Final    Comment:    **In accordance with recommendations from the NKF-ASN Task force,**   [facility] is in the process of updating its eGFR calculation to the   2021 CKD-EPI creatinine equation that estimates kidney function   without a race variable.    GFR calc non Af Amer  Date Value Ref Range Status  04/30/2020 57 (L) >59 mL/min/1.73 Final         Passed - Cr in normal range and within 360 days    Creatinine, Ser  Date Value Ref Range Status  04/30/2020 0.88 0.57 - 1.00 mg/dL Final         Passed - HBA1C is between 0 and 7.9 and within 180 days    Hgb A1c MFr Bld  Date Value Ref Range Status  04/30/2020 6.3 (H) 4.8 - 5.6 % Final    Comment:             Prediabetes: 5.7 - 6.4          Diabetes: >6.4          Glycemic control for adults with diabetes: <7.0          Passed - Valid encounter within last 6 months    Recent Outpatient Visits          2 months ago Essential hypertension   [facility] Glean Hess, MD   8 months ago Essential hypertension   [facility] Glean Hess, MD   1 year ago Bilateral hearing loss, unspecified hearing loss type   [facility] Glean Hess, MD   1 year ago Essential hypertension   [facility] Glean Hess, MD   1 year ago Essential (primary) hypertension   [facility] Glean Hess, MD      Future Appointments            In 3 months Army Melia Jesse Sans, MD [facility] [place]   In 9 months Army Melia, Jesse Sans, MD [facility]

## 2020-10-12 ENCOUNTER — Other Ambulatory Visit: Payer: Self-pay | Admitting: Internal Medicine

## 2020-10-12 DIAGNOSIS — E118 Type 2 diabetes mellitus with unspecified complications: Secondary | ICD-10-CM

## 2020-10-12 DIAGNOSIS — B9681 Helicobacter pylori [H. pylori] as the cause of diseases classified elsewhere: Secondary | ICD-10-CM

## 2020-10-12 DIAGNOSIS — K289 Gastrojejunal ulcer, unspecified as acute or chronic, without hemorrhage or perforation: Secondary | ICD-10-CM

## 2020-10-20 ENCOUNTER — Ambulatory Visit: Payer: Medicare Other | Admitting: Internal Medicine

## 2020-10-26 DIAGNOSIS — M79674 Pain in right toe(s): Secondary | ICD-10-CM | POA: Diagnosis not present

## 2020-10-26 DIAGNOSIS — M79675 Pain in left toe(s): Secondary | ICD-10-CM | POA: Diagnosis not present

## 2020-10-26 DIAGNOSIS — B351 Tinea unguium: Secondary | ICD-10-CM | POA: Diagnosis not present

## 2020-10-28 ENCOUNTER — Ambulatory Visit
Admission: RE | Admit: 2020-10-28 | Discharge: 2020-10-28 | Disposition: A | Discharge status: Home / Self Care | Payer: Medicare Other | Source: Ambulatory Visit | Attending: Internal Medicine | Admitting: Internal Medicine

## 2020-10-28 ENCOUNTER — Ambulatory Visit (INDEPENDENT_AMBULATORY_CARE_PROVIDER_SITE_OTHER): Payer: Medicare Other | Admitting: Internal Medicine

## 2020-10-28 ENCOUNTER — Ambulatory Visit
Admission: RE | Admit: 2020-10-28 | Discharge: 2020-10-28 | Disposition: A | Discharge status: Home / Self Care | Payer: Medicare Other | Attending: Internal Medicine | Admitting: Internal Medicine

## 2020-10-28 ENCOUNTER — Encounter: Payer: Self-pay | Admitting: Internal Medicine

## 2020-10-28 ENCOUNTER — Other Ambulatory Visit: Payer: Self-pay

## 2020-10-28 VITALS — BP 140/80 | HR 68 | Temp 97.7°F | Ht 64.0 in | Wt 176.0 lb

## 2020-10-28 DIAGNOSIS — B9681 Helicobacter pylori [H. pylori] as the cause of diseases classified elsewhere: Secondary | ICD-10-CM | POA: Diagnosis not present

## 2020-10-28 DIAGNOSIS — E118 Type 2 diabetes mellitus with unspecified complications: Secondary | ICD-10-CM

## 2020-10-28 DIAGNOSIS — K289 Gastrojejunal ulcer, unspecified as acute or chronic, without hemorrhage or perforation: Secondary | ICD-10-CM

## 2020-10-28 DIAGNOSIS — R269 Unspecified abnormalities of gait and mobility: Secondary | ICD-10-CM | POA: Diagnosis not present

## 2020-10-28 DIAGNOSIS — M25561 Pain in right knee: Secondary | ICD-10-CM | POA: Insufficient documentation

## 2020-10-28 DIAGNOSIS — I10 Essential (primary) hypertension: Secondary | ICD-10-CM

## 2020-10-28 DIAGNOSIS — E785 Hyperlipidemia, unspecified: Secondary | ICD-10-CM | POA: Diagnosis not present

## 2020-10-28 DIAGNOSIS — E1169 Type 2 diabetes mellitus with other specified complication: Secondary | ICD-10-CM

## 2020-10-28 LAB — POCT GLYCOSYLATED HEMOGLOBIN (HGB A1C): Hemoglobin A1C: 6.1 % — AB (ref 4.0–5.6)

## 2020-10-28 MED ORDER — METFORMIN HCL ER 750 MG PO TB24: 750.0000 mg | ORAL_TABLET | Freq: Every day | ORAL | 0 refills | Status: DC

## 2020-10-28 NOTE — Progress Notes (Signed)
Date:  10/28/2020   Name:  Penny Andrews   DOB:  Mar 10, 1925   MRN:  [id number]   Chief Complaint: Hypertension and Diabetes  Hypertension This is a chronic problem. The problem is controlled. Pertinent negatives include no chest pain, headaches, palpitations or shortness of breath. Past treatments include ACE inhibitors. The current treatment provides significant improvement.  Diabetes She presents for her follow-up diabetic visit. She has type 2 diabetes mellitus. Her disease course has been stable. Pertinent negatives for hypoglycemia include no headaches, nervousness/anxiousness or tremors. Pertinent negatives for diabetes include no chest pain, no fatigue, no polydipsia and no polyuria. Current diabetic treatment includes oral agent (dual therapy) (metformin and glipizide). She is compliant with treatment all of the time. An ACE inhibitor/angiotensin II receptor blocker is being taken. Eye exam is not current.  Knee Pain  The incident occurred more than 1 week ago. Incident location: out at a restaurant. The injury mechanism was a fall. The pain is present in the right knee. The pain is mild. The pain has been fluctuating since onset. Associated symptoms include an inability to bear weight, a loss of motion and muscle weakness. Pertinent negatives include no numbness. Associated symptoms comments: Able to walk but has to hold on to walls/furniture.    Lab Results  Component Value Date   CREATININE 0.88 04/30/2020   BUN 20 04/30/2020   NA 138 04/30/2020   K 5.0 04/30/2020   CL 101 04/30/2020   CO2 24 04/30/2020   Lab Results  Component Value Date   CHOL 181 04/30/2020   HDL 51 04/30/2020   LDLCALC 112 (H) 04/30/2020   TRIG 97 04/30/2020   CHOLHDL 3.5 04/30/2020   Lab Results  Component Value Date   TSH 1.240 04/30/2020   Lab Results  Component Value Date   HGBA1C 6.1 (A) 10/28/2020   Lab Results  Component Value Date   WBC 5.7 04/30/2020   HGB 13.5 04/30/2020   HCT  39.9 04/30/2020   MCV 85 04/30/2020   PLT 181 04/30/2020   Lab Results  Component Value Date   ALT 10 04/30/2020   AST 19 04/30/2020   ALKPHOS 96 04/30/2020   BILITOT 0.4 04/30/2020     Review of Systems  Constitutional: Negative for appetite change, fatigue, fever and unexpected weight change.  HENT: Negative for tinnitus and trouble swallowing.   Eyes: Negative for visual disturbance.  Respiratory: Negative for cough, chest tightness and shortness of breath.   Cardiovascular: Positive for leg swelling. Negative for chest pain and palpitations.  Gastrointestinal: Negative for abdominal pain.  Endocrine: Negative for polydipsia and polyuria.  Genitourinary: Negative for dysuria and hematuria.  Musculoskeletal: Positive for arthralgias, gait problem and joint swelling.  Neurological: Negative for tremors, numbness and headaches.  Psychiatric/Behavioral: Positive for decreased concentration. Negative for dysphoric mood and sleep disturbance. The patient is not nervous/anxious.     Patient Active Problem List   Diagnosis Date Noted  . Bilateral hearing loss 05/06/2019  . Allergic rhinitis 10/31/2014  . 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 Essential hypertension 10/31/2014  . Hyperlipidemia associated with type 2 diabetes mellitus ([place]) 10/31/2014  . Hx of gastric ulcer 10/31/2014  . Has a tremor 10/31/2014  . Avitaminosis D 10/31/2014  . Type II diabetes mellitus with complication ([place]) 11/04/3356    Allergies  Allergen Reactions  . Penicillins Swelling    Past Surgical History:  Procedure Laterality Date  . ESOPHAGOGASTRODUODENOSCOPY  07/2014   grade D esophagitis;  prepyloric ulcer  . EYE SURGERY Bilateral    cataracts  . TONSILLECTOMY    . UMBILICAL HERNIA REPAIR      Social History   Tobacco Use  . Smoking status: Never Smoker  . Smokeless tobacco: Never Used  . Tobacco comment: smoking cessation materials not required  Vaping Use  . Vaping Use: Never  used  Substance Use Topics  . Alcohol use: Yes    Alcohol/week: 0.0 standard drinks    Comment: occasionally once a year  . Drug use: No     Medication list has been reviewed and updated.  Current Meds  Medication Sig  . ACCU-CHEK SOFTCLIX LANCETS lancets   . aspirin 81 MG tablet Take 81 mg by mouth every evening.  . Blood Glucose Monitoring Suppl (ACCU-CHEK AVIVA PLUS) W/DEVICE KIT U UTD  . cholecalciferol (VITAMIN D) 1000 UNITS tablet Take 1,000 Units by mouth daily.  Marland Kitchen glipiZIDE (GLUCOTROL) 5 MG tablet Take 1 tablet (5 mg total) by mouth 2 (two) times daily before a meal.  . glucose blood test strip   . lisinopril (ZESTRIL) 5 MG tablet TAKE 1 TABLET(5 MG) BY MOUTH DAILY  . lovastatin (MEVACOR) 40 MG tablet Take 1 tablet (40 mg total) by mouth every evening.  . magnesium 30 MG tablet Take 30 mg by mouth 2 (two) times daily.  . metFORMIN (GLUCOPHAGE-XR) 750 MG 24 hr tablet TAKE 1 TABLET(750 MG) BY MOUTH TWICE DAILY  . Multiple Vitamin (MULTIVITAMIN WITH MINERALS) TABS tablet Take 1 tablet by mouth daily.  . pantoprazole (PROTONIX) 40 MG tablet TAKE 1 TABLET(40 MG) BY MOUTH DAILY  . vitamin C (ASCORBIC ACID) 500 MG tablet Take by mouth.    PHQ 2/9 Scores 10/28/2020 04/30/2020 10/24/2019 04/26/2019  PHQ - 2 Score 3 0 0 0  PHQ- 9 Score 6 0 0 -    GAD 7 : Generalized Anxiety Score 10/28/2020 04/30/2020  Nervous, Anxious, on Edge 0 0  Control/stop worrying 0 0  Worry too much - different things 1 0  Trouble relaxing 0 0  Restless 0 0  Easily annoyed or irritable 0 0  Afraid - awful might happen 0 0  Total GAD 7 Score 1 0  Anxiety Difficulty Not difficult at all Not difficult at all    BP Readings from Last 3 Encounters:  10/28/20 140/80  04/30/20 118/70  10/24/19 128/74    Physical Exam Vitals and nursing note reviewed.  Constitutional:      General: She is not in acute distress.    Appearance: She is well-developed.  HENT:     Head: Normocephalic and atraumatic.   Cardiovascular:     Rate and Rhythm: Normal rate and regular rhythm.     Pulses: Normal pulses.     Heart sounds: No murmur heard. No friction rub.  Pulmonary:     Effort: Pulmonary effort is normal. No respiratory distress.  Musculoskeletal:     Right knee: Bony tenderness present. Decreased range of motion. No tenderness.     Right lower leg: No edema.     Left lower leg: Swelling present. No deformity, lacerations or tenderness. 1+ Edema present.  Skin:    General: Skin is warm and dry.     Capillary Refill: Capillary refill takes less than 2 seconds.     Findings: No rash.  Neurological:     General: No focal deficit present.     Mental Status: She is alert and oriented to person, place, and time.  Motor: Weakness present.     Gait: Gait abnormal.  Psychiatric:        Mood and Affect: Mood normal.        Behavior: Behavior normal.     Wt Readings from Last 3 Encounters:  10/28/20 176 lb (79.8 kg)  04/30/20 167 lb (75.8 kg)  10/24/19 168 lb (76.2 kg)    BP 140/80   Pulse 68   Temp 97.7 F (36.5 C) (Oral)   Ht _0  (1.626 m)   Wt 176 lb (79.8 kg)   SpO2 96%   BMI 30.21 kg/m   Assessment and Plan: 1. Essential hypertension Clinically stable exam with well controlled BP. Tolerating medications without side effects at this time. Mild edema due to excessive sodium intake via "junk food" Elevate and wear compression stockings during the day Pt to continue current regimen and low sodium diet; benefits of regular exercise as able discussed.  2. Hyperlipidemia associated with type 2 diabetes mellitus ([place]) On statin medication  3. Type II diabetes mellitus with complication (HCC) Clinically stable by exam and report without s/s of hypoglycemia. DM complicated by HTN. Tolerating medications well without side effects or other concerns. Due to low A1C will reduce metformin to once a day and glipizide to once a day - POCT glycosylated hemoglobin (Hb A1C) = 6.1 -  metFORMIN (GLUCOPHAGE-XR) 750 MG 24 hr tablet; Take 1 tablet (750 mg total) by mouth daily with breakfast.  Dispense: 90 tablet; Refill: 0  4. Gastrointestinal ulcer due to Helicobacter pylori Continue daily PPI  5. Gait disturbance Worse since her fall but without evidence of acute injury Recommend Rolator - Rx given  6. Acute pain of right knee Will obtain imaging Consider SM for possible joint injection - DG Knee Complete 4 Views Right; Future   Partially dictated using Dragon software. Any errors are unintentional.  Halina Maidens, MD [facility]  10/28/2020

## 2020-10-28 NOTE — Patient Instructions (Addendum)
Reduce metformin to once a day  Take the Glipizide in the morning only

## 2020-11-03 ENCOUNTER — Telehealth: Payer: Self-pay

## 2020-11-03 NOTE — Telephone Encounter (Unsigned)
Copied from [place] [id number]. Topic: General - Other >> Nov 03, 2020 10:43 AM Pawlus, Brayton Layman A wrote: Reason for CRM: Penny Andrews called in asking for some more details about the Pt, wanted some more information regarding her latest Xray and what will be done on her next appt 5/19 with Dr Zigmund Daniel. Please call back.

## 2020-11-03 NOTE — Telephone Encounter (Signed)
Called pt house phone. Left VM to call back. Pt has fluid in her knees as well as arthritis. We do not know what will be done at her office visit.  Will route this note to [facility] Nurse just in case the patient returns call to clinic. [facility] Nurse may give patient results if pt returns the call.  CRM has also been created for this msg for call center purposes. Please attach any notes regarding this lab to this result message and do not create a new CRM.    KP

## 2020-11-04 NOTE — Telephone Encounter (Signed)
Patient's daughter Santiago Glad called and advised of the note below, she verbalized understanding. She asked what could be done for the fluid because her mom wants her to cancel the appointment with Dr. Zigmund Daniel because she's not receiving the shot. I advised Dr. Zigmund Daniel is orthopedic so he would be the expert dealing with pain in the knees and alternative treatments. She says she will talk to her mom and if she still insists on not going she will call and cancel. 4 month f/u with Dr. Army Melia scheduled for Tuesday, 03/02/21 at 1340.

## 2020-11-05 ENCOUNTER — Encounter: Payer: Self-pay | Admitting: Family Medicine

## 2021-01-17 ENCOUNTER — Other Ambulatory Visit: Payer: Self-pay | Admitting: Internal Medicine

## 2021-01-17 DIAGNOSIS — E118 Type 2 diabetes mellitus with unspecified complications: Secondary | ICD-10-CM

## 2021-01-17 NOTE — Telephone Encounter (Signed)
Requested Prescriptions  Pending Prescriptions Disp Refills  . metFORMIN (GLUCOPHAGE-XR) 750 MG 24 hr tablet [Pharmacy Med Name: METFORMIN ER 750MG 24HR TABS] 180 tablet 0    Sig: TAKE 1 TABLET(750 MG) BY MOUTH TWICE DAILY     Endocrinology:  Diabetes - Biguanides Failed - 01/17/2021  9:55 AM      Failed - eGFR in normal range and within 360 days    GFR calc Af Amer  Date Value Ref Range Status  04/30/2020 65 >59 mL/min/1.73 Final    Comment:    **In accordance with recommendations from the NKF-ASN Task force,**   [facility] is in the process of updating its eGFR calculation to the   2021 CKD-EPI creatinine equation that estimates kidney function   without a race variable.    GFR calc non Af Amer  Date Value Ref Range Status  04/30/2020 57 (L) >59 mL/min/1.73 Final         Passed - Cr in normal range and within 360 days    Creatinine, Ser  Date Value Ref Range Status  04/30/2020 0.88 0.57 - 1.00 mg/dL Final         Passed - HBA1C is between 0 and 7.9 and within 180 days    Hemoglobin A1C  Date Value Ref Range Status  10/28/2020 6.1 (A) 4.0 - 5.6 % Final   Hgb A1c MFr Bld  Date Value Ref Range Status  04/30/2020 6.3 (H) 4.8 - 5.6 % Final    Comment:             Prediabetes: 5.7 - 6.4          Diabetes: >6.4          Glycemic control for adults with diabetes: <7.0          Passed - Valid encounter within last 6 months    Recent Outpatient Visits          2 months ago Essential hypertension   [place], MD   8 months ago Essential hypertension   [facility] Glean Hess, MD   1 year ago Essential hypertension   [facility] Glean Hess, MD   1 year ago Bilateral hearing loss, unspecified hearing loss type   [facility] Glean Hess, MD   1 year ago Essential hypertension   [facility] Glean Hess, MD      Future Appointments            In 1 month Army Melia Jesse Sans, MD  [facility] [place]   In 3 months Army Melia, Jesse Sans, MD [facility], [facility]

## 2021-02-09 ENCOUNTER — Telehealth: Payer: Self-pay

## 2021-02-09 NOTE — Telephone Encounter (Signed)
Placed call to patient to schedule Annual wellness visit. LVM

## 2021-02-21 ENCOUNTER — Telehealth: Payer: Self-pay | Admitting: *Deleted

## 2021-02-21 NOTE — Telephone Encounter (Signed)
Unable to reach patient to schedule her AWV for Medicare. Staff LMOM.

## 2021-03-02 ENCOUNTER — Other Ambulatory Visit: Payer: Self-pay

## 2021-03-02 ENCOUNTER — Ambulatory Visit (INDEPENDENT_AMBULATORY_CARE_PROVIDER_SITE_OTHER): Payer: Medicare Other | Admitting: Internal Medicine

## 2021-03-02 ENCOUNTER — Encounter: Payer: Self-pay | Admitting: Internal Medicine

## 2021-03-02 VITALS — BP 140/76 | HR 76 | Temp 98.2°F | Ht 64.0 in | Wt 171.0 lb

## 2021-03-02 DIAGNOSIS — E785 Hyperlipidemia, unspecified: Secondary | ICD-10-CM

## 2021-03-02 DIAGNOSIS — E1169 Type 2 diabetes mellitus with other specified complication: Secondary | ICD-10-CM

## 2021-03-02 DIAGNOSIS — I10 Essential (primary) hypertension: Secondary | ICD-10-CM

## 2021-03-02 DIAGNOSIS — E559 Vitamin D deficiency, unspecified: Secondary | ICD-10-CM

## 2021-03-02 DIAGNOSIS — R790 Abnormal level of blood mineral: Secondary | ICD-10-CM | POA: Diagnosis not present

## 2021-03-02 DIAGNOSIS — E118 Type 2 diabetes mellitus with unspecified complications: Secondary | ICD-10-CM | POA: Diagnosis not present

## 2021-03-02 DIAGNOSIS — K289 Gastrojejunal ulcer, unspecified as acute or chronic, without hemorrhage or perforation: Secondary | ICD-10-CM | POA: Diagnosis not present

## 2021-03-02 DIAGNOSIS — B9681 Helicobacter pylori [H. pylori] as the cause of diseases classified elsewhere: Secondary | ICD-10-CM

## 2021-03-02 DIAGNOSIS — Z23 Encounter for immunization: Secondary | ICD-10-CM

## 2021-03-02 MED ORDER — PANTOPRAZOLE SODIUM 40 MG PO TBEC: DELAYED_RELEASE_TABLET | ORAL | 0 refills | Status: DC

## 2021-03-02 NOTE — Progress Notes (Signed)
Date:  03/02/2021   Name:  Penny Andrews   DOB:  1925-04-12   MRN:  [id number]   Chief Complaint: Diabetes, Hypertension, and Flu Vaccine Needs foot exam and Eye exam.  Diabetes She presents for her follow-up diabetic visit. She has type 2 diabetes mellitus. Her disease course has been stable. Pertinent negatives for hypoglycemia include no headaches or tremors. Pertinent negatives for diabetes include no chest pain, no fatigue, no polydipsia and no polyuria. Symptoms are stable. Current diabetic treatment includes oral agent (dual therapy) (metformin and glipizide). She is compliant with treatment all of the time. An ACE inhibitor/angiotensin II receptor blocker is being taken. Eye exam is current.  Hypertension This is a chronic problem. The problem is controlled. Pertinent negatives include no chest pain, headaches, palpitations or shortness of breath. Past treatments include ACE inhibitors.  Hyperlipidemia This is a chronic problem. The problem is controlled. Pertinent negatives include no chest pain or shortness of breath. Current antihyperlipidemic treatment includes statins. The current treatment provides significant improvement of lipids.   Immunization History  Administered Date(s) Administered   Fluad Quad(high Dose 65+) 03/21/2019, 04/30/2020   Influenza, High Dose Seasonal PF 03/23/2017, 03/28/2018   Influenza-Unspecified 03/14/2016   Moderna Sars-Covid-2 Vaccination 10/28/2019   Pneumococcal Conjugate-13 07/29/2013, 03/22/2016   Pneumococcal Polysaccharide-23 03/07/2006   Pneumococcal-Unspecified 06/20/2012   Tdap 06/29/2011   Zoster, Live 12/03/2006    Lab Results  Component Value Date   CREATININE 0.88 04/30/2020   BUN 20 04/30/2020   NA 138 04/30/2020   K 5.0 04/30/2020   CL 101 04/30/2020   CO2 24 04/30/2020   Lab Results  Component Value Date   CHOL 181 04/30/2020   HDL 51 04/30/2020   LDLCALC 112 (H) 04/30/2020   TRIG 97 04/30/2020   CHOLHDL 3.5  04/30/2020   Lab Results  Component Value Date   TSH 1.240 04/30/2020   Lab Results  Component Value Date   HGBA1C 6.1 (A) 10/28/2020   Lab Results  Component Value Date   WBC 5.7 04/30/2020   HGB 13.5 04/30/2020   HCT 39.9 04/30/2020   MCV 85 04/30/2020   PLT 181 04/30/2020   Lab Results  Component Value Date   ALT 10 04/30/2020   AST 19 04/30/2020   ALKPHOS 96 04/30/2020   BILITOT 0.4 04/30/2020  Last vitamin D Lab Results  Component Value Date   VD25OH 42.3 04/30/2020      Review of Systems  Constitutional:  Negative for appetite change, fatigue, fever and unexpected weight change.  HENT:  Positive for hearing loss. Negative for tinnitus and trouble swallowing.   Eyes:  Negative for visual disturbance.  Respiratory:  Negative for cough, chest tightness and shortness of breath.   Cardiovascular:  Negative for chest pain, palpitations and leg swelling.  Gastrointestinal:  Negative for abdominal pain.  Endocrine: Negative for polydipsia and polyuria.  Genitourinary:  Negative for dysuria and hematuria.  Musculoskeletal:  Negative for arthralgias.  Neurological:  Negative for tremors, numbness and headaches.  Psychiatric/Behavioral:  Negative for dysphoric mood.    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   Essential hypertension 10/31/2014   Hyperlipidemia associated with type 2 diabetes mellitus ([place]) 10/31/2014   Hx of gastric ulcer 10/31/2014   Has a tremor 10/31/2014   Avitaminosis D 10/31/2014   Type II diabetes mellitus with complication ([place]) 26/71/2458  Allergies  Allergen Reactions   Penicillins Swelling    Past Surgical History:  Procedure Laterality Date   ESOPHAGOGASTRODUODENOSCOPY  07/2014   grade D esophagitis; prepyloric ulcer   EYE SURGERY Bilateral    cataracts   TONSILLECTOMY     UMBILICAL HERNIA REPAIR       Social History   Tobacco Use   Smoking status: Never   Smokeless tobacco: Never   Tobacco comments:    smoking cessation materials not required  Vaping Use   Vaping Use: Never used  Substance Use Topics   Alcohol use: Yes    Alcohol/week: 0.0 standard drinks    Comment: occasionally once a year   Drug use: No     Medication list has been reviewed and updated.  Current Meds  Medication Sig   ACCU-CHEK SOFTCLIX LANCETS lancets    Acetylcysteine (NAC 600 PO) Take by mouth daily.   Apoaequorin (PREVAGEN PO) Take by mouth daily.   Blood Glucose Monitoring Suppl (ACCU-CHEK AVIVA PLUS) W/DEVICE KIT U UTD   cholecalciferol (VITAMIN D) 1000 UNITS tablet Take 1,000 Units by mouth daily.   glipiZIDE (GLUCOTROL) 5 MG tablet Take 1 tablet (5 mg total) by mouth 2 (two) times daily before a meal.   glucose blood test strip    lisinopril (ZESTRIL) 5 MG tablet TAKE 1 TABLET(5 MG) BY MOUTH DAILY   lovastatin (MEVACOR) 40 MG tablet Take 1 tablet (40 mg total) by mouth every evening.   magnesium 30 MG tablet Take 400 mg by mouth 2 (two) times daily.   metFORMIN (GLUCOPHAGE-XR) 750 MG 24 hr tablet TAKE 1 TABLET(750 MG) BY MOUTH TWICE DAILY (Patient taking differently: daily.)   Multiple Vitamin (MULTIVITAMIN WITH MINERALS) TABS tablet Take 1 tablet by mouth daily.   vitamin C (ASCORBIC ACID) 500 MG tablet Take by mouth.   [DISCONTINUED] pantoprazole (PROTONIX) 40 MG tablet TAKE 1 TABLET(40 MG) BY MOUTH DAILY    PHQ 2/9 Scores 03/02/2021 10/28/2020 04/30/2020 10/24/2019  PHQ - 2 Score 0 3 0 0  PHQ- 9 Score 0 6 0 0    GAD 7 : Generalized Anxiety Score 03/02/2021 10/28/2020 04/30/2020  Nervous, Anxious, on Edge 0 0 0  Control/stop worrying 0 0 0  Worry too much - different things 0 1 0  Trouble relaxing 0 0 0  Restless 0 0 0  Easily annoyed or irritable 0 0 0  Afraid - awful might happen 0 0 0  Total GAD 7 Score 0 1 0  Anxiety Difficulty - Not difficult at all Not difficult at all    BP  Readings from Last 3 Encounters:  03/02/21 140/76  10/28/20 140/80  04/30/20 118/70    Physical Exam Vitals and nursing note reviewed.  Constitutional:      General: She is not in acute distress.    Appearance: Normal appearance. She is well-developed.  HENT:     Head: Normocephalic and atraumatic.  Cardiovascular:     Rate and Rhythm: Normal rate and regular rhythm.     Pulses: Normal pulses.     Heart sounds: No murmur heard. Pulmonary:     Effort: Pulmonary effort is normal. No respiratory distress.     Breath sounds: No wheezing or rhonchi.  Musculoskeletal:     Cervical back: Normal range of motion.     Right lower leg: No edema.     Left lower leg: No edema.  Lymphadenopathy:     Cervical: No cervical adenopathy.  Skin:  General: Skin is warm and dry.     Capillary Refill: Capillary refill takes less than 2 seconds.     Findings: No rash.  Neurological:     Mental Status: She is alert and oriented to person, place, and time.  Psychiatric:        Mood and Affect: Mood normal.        Behavior: Behavior normal.    Wt Readings from Last 3 Encounters:  03/02/21 171 lb (77.6 kg)  10/28/20 176 lb (79.8 kg)  04/30/20 167 lb (75.8 kg)    BP 140/76   Pulse 76   Temp 98.2 F (36.8 C) (Oral)   Ht 5' 4" (1.626 m)   Wt 171 lb (77.6 kg)   SpO2 96%   BMI 29.35 kg/m   Assessment and Plan: 1. Essential hypertension Clinically stable exam with well controlled BP. Tolerating medications without side effects at this time. Pt to continue current regimen and low sodium diet; benefits of regular exercise as able discussed. - CBC with Differential/Platelet  2. Type II diabetes mellitus with complication (HCC) Clinically stable by exam and report without s/s of hypoglycemia. DM complicated by hypertension and dyslipidemia. Metformin was cut in half last visit.  If A1C is stable, will reduce glipizide to once a day. Can take ASA EC 81 mg daily. - Comprehensive metabolic  panel - TSH  3. Hyperlipidemia associated with type 2 diabetes mellitus ([place]) Tolerating statin medication without side effects at this time LDL is at goal of < 70 on current dose Continue same therapy without change at this time. - Lipid panel  4. Avitaminosis D supplemented  5. Gastrointestinal ulcer due to Helicobacter pylori Symptoms well controlled on daily PPI No red flag signs such as weight loss, n/v, melena Will continue pantoprazole. - pantoprazole (PROTONIX) 40 MG tablet; TAKE 1 TABLET(40 MG) BY MOUTH DAILY  Dispense: 90 tablet; Refill: 0  6. Need for immunization against influenza - Flu Vaccine QUAD High Dose(Fluad)   Partially dictated using Editor, commissioning. Any errors are unintentional.  Halina Maidens, MD [facility]  03/02/2021

## 2021-03-02 NOTE — Patient Instructions (Addendum)
Schedule DM eye exam  Can take the baby aspirin but only if it is Enteric Coated. 81 mg once a day.

## 2021-03-03 LAB — COMPREHENSIVE METABOLIC PANEL
ALT: 12 IU/L (ref 0–32)
AST: 19 IU/L (ref 0–40)
Albumin/Globulin Ratio: 2 (ref 1.2–2.2)
Albumin: 4.5 g/dL (ref 3.5–4.6)
Alkaline Phosphatase: 97 IU/L (ref 44–121)
BUN/Creatinine Ratio: 18 (ref 12–28)
BUN: 12 mg/dL (ref 10–36)
Bilirubin Total: 0.3 mg/dL (ref 0.0–1.2)
CO2: 25 mmol/L (ref 20–29)
Calcium: 9.6 mg/dL (ref 8.7–10.3)
Chloride: 103 mmol/L (ref 96–106)
Creatinine, Ser: 0.66 mg/dL (ref 0.57–1.00)
Globulin, Total: 2.3 g/dL (ref 1.5–4.5)
Glucose: 96 mg/dL (ref 65–99)
Potassium: 4.4 mmol/L (ref 3.5–5.2)
Sodium: 142 mmol/L (ref 134–144)
Total Protein: 6.8 g/dL (ref 6.0–8.5)
eGFR: 82 mL/min/{1.73_m2} (ref >59)

## 2021-03-03 LAB — CBC WITH DIFFERENTIAL/PLATELET
Basophils Absolute: 0 10*3/uL (ref 0.0–0.2)
Basos: 1 %
EOS (ABSOLUTE): 0.3 10*3/uL (ref 0.0–0.4)
Eos: 5 %
Hematocrit: 41 % (ref 34.0–46.6)
Hemoglobin: 13.6 g/dL (ref 11.1–15.9)
Immature Grans (Abs): 0 10*3/uL (ref 0.0–0.1)
Immature Granulocytes: 0 %
Lymphocytes Absolute: 2 10*3/uL (ref 0.7–3.1)
Lymphs: 35 %
MCH: 27.6 pg (ref 26.6–33.0)
MCHC: 33.2 g/dL (ref 31.5–35.7)
MCV: 83 fL (ref 79–97)
Monocytes Absolute: 0.5 10*3/uL (ref 0.1–0.9)
Monocytes: 8 %
Neutrophils Absolute: 3 10*3/uL (ref 1.4–7.0)
Neutrophils: 51 %
Platelets: 193 10*3/uL (ref 150–450)
RBC: 4.93 x10E6/uL (ref 3.77–5.28)
RDW: 13.2 % (ref 11.7–15.4)
WBC: 5.8 10*3/uL (ref 3.4–10.8)

## 2021-03-03 LAB — LIPID PANEL
Chol/HDL Ratio: 3.3 ratio (ref 0.0–4.4)
Cholesterol, Total: 144 mg/dL (ref 100–199)
HDL: 44 mg/dL (ref >39)
LDL Chol Calc (NIH): 81 mg/dL (ref 0–99)
Triglycerides: 105 mg/dL (ref 0–149)
VLDL Cholesterol Cal: 19 mg/dL (ref 5–40)

## 2021-03-03 LAB — TSH: TSH: 1.19 u[IU]/mL (ref 0.450–4.500)

## 2021-03-05 LAB — HGB A1C W/O EAG: Hgb A1c MFr Bld: 6.2 % — ABNORMAL HIGH (ref 4.8–5.6)

## 2021-03-05 LAB — SPECIMEN STATUS REPORT

## 2021-04-05 DIAGNOSIS — B351 Tinea unguium: Secondary | ICD-10-CM | POA: Diagnosis not present

## 2021-04-05 DIAGNOSIS — M79675 Pain in left toe(s): Secondary | ICD-10-CM | POA: Diagnosis not present

## 2021-04-05 DIAGNOSIS — M79674 Pain in right toe(s): Secondary | ICD-10-CM | POA: Diagnosis not present

## 2021-04-22 ENCOUNTER — Other Ambulatory Visit: Payer: Self-pay | Admitting: Internal Medicine

## 2021-04-22 DIAGNOSIS — I10 Essential (primary) hypertension: Secondary | ICD-10-CM

## 2021-04-22 NOTE — Telephone Encounter (Signed)
Requested Prescriptions  Pending Prescriptions Disp Refills  . lisinopril (ZESTRIL) 5 MG tablet [Pharmacy Med Name: LISINOPRIL 5MG  TABLETS] 90 tablet 1    Sig: TAKE 1 TABLET(5 MG) BY MOUTH DAILY     Cardiovascular:  ACE Inhibitors Failed - 04/22/2021  6:20 AM      Failed - Last BP in normal range    BP Readings from Last 1 Encounters:  03/02/21 140/76         Passed - Cr in normal range and within 180 days    Creatinine, Ser  Date Value Ref Range Status  03/02/2021 0.66 0.57 - 1.00 mg/dL Final         Passed - K in normal range and within 180 days    Potassium  Date Value Ref Range Status  03/02/2021 4.4 3.5 - 5.2 mmol/L Final         Passed - Patient is not pregnant      Passed - Valid encounter within last 6 months    Recent Outpatient Visits          1 month ago Essential hypertension   [place], MD   5 months ago Essential hypertension   [place], MD   11 months ago Essential hypertension   [facility] Glean Hess, MD   1 year ago Essential hypertension   [facility] Glean Hess, MD   1 year ago Bilateral hearing loss, unspecified hearing loss type   [facility] Glean Hess, MD      Future Appointments            In 1 week Army Melia Jesse Sans, MD [facility], [facility]

## 2021-05-04 ENCOUNTER — Encounter: Payer: Medicare Other | Admitting: Internal Medicine

## 2021-05-05 ENCOUNTER — Encounter: Payer: Self-pay | Admitting: Internal Medicine

## 2021-05-05 ENCOUNTER — Other Ambulatory Visit: Payer: Self-pay

## 2021-05-05 ENCOUNTER — Ambulatory Visit (INDEPENDENT_AMBULATORY_CARE_PROVIDER_SITE_OTHER): Payer: Medicare Other | Admitting: Internal Medicine

## 2021-05-05 VITALS — BP 126/74 | HR 78 | Ht 64.0 in | Wt 178.2 lb

## 2021-05-05 DIAGNOSIS — B9681 Helicobacter pylori [H. pylori] as the cause of diseases classified elsewhere: Secondary | ICD-10-CM

## 2021-05-05 DIAGNOSIS — R600 Localized edema: Secondary | ICD-10-CM

## 2021-05-05 DIAGNOSIS — E785 Hyperlipidemia, unspecified: Secondary | ICD-10-CM

## 2021-05-05 DIAGNOSIS — E118 Type 2 diabetes mellitus with unspecified complications: Secondary | ICD-10-CM

## 2021-05-05 DIAGNOSIS — E1169 Type 2 diabetes mellitus with other specified complication: Secondary | ICD-10-CM | POA: Diagnosis not present

## 2021-05-05 DIAGNOSIS — K289 Gastrojejunal ulcer, unspecified as acute or chronic, without hemorrhage or perforation: Secondary | ICD-10-CM

## 2021-05-05 LAB — POCT GLYCOSYLATED HEMOGLOBIN (HGB A1C): Hemoglobin A1C: 5.6 % (ref 4.0–5.6)

## 2021-05-05 MED ORDER — GLIPIZIDE 5 MG PO TABS
5.0000 mg | ORAL_TABLET | Freq: Every day | ORAL | 0 refills | Status: DC
Start: 2021-05-05 — End: 2021-05-24

## 2021-05-05 MED ORDER — METFORMIN HCL ER 750 MG PO TB24: 750.0000 mg | ORAL_TABLET | Freq: Every day | ORAL | 0 refills | Status: DC

## 2021-05-05 MED ORDER — FUROSEMIDE 20 MG PO TABS: 20.0000 mg | ORAL_TABLET | Freq: Every day | ORAL | 0 refills | Status: DC | PRN

## 2021-05-05 NOTE — Progress Notes (Signed)
Date:  05/05/2021   Name:  Penny Andrews   DOB:  01-09-1925   MRN:  [id number]   Chief Complaint: Hypertension, Diabetes, and Gastroesophageal Reflux  Diabetes She presents for her follow-up diabetic visit. She has type 2 diabetes mellitus. Hypoglycemia symptoms include confusion. Pertinent negatives for hypoglycemia include no dizziness or headaches. Pertinent negatives for diabetes include no chest pain, no fatigue and no weakness. Symptoms are improving. Current diabetic treatments: metformin once a day and glimepiride twice a day. Her weight is stable. She participates in exercise intermittently.  Hypertension This is a chronic problem. The problem is controlled. Pertinent negatives include no chest pain, headaches, palpitations or shortness of breath.  Edema -recent mild increase in lower extremity edema with prominent spider veins and mild increase in skin warmth.  There is been no bleeding drainage or injury.  She does not elevate her legs routinely when sitting.  She does not consume a significant amount of plain water daily.  It is unclear how much sodium is in her diet-her son recently visited and did a lot of cooking which apparently ate quite well.  Her weight is up 7 pounds. Lab Results  Component Value Date   CREATININE 0.66 03/02/2021   BUN 12 03/02/2021   NA 142 03/02/2021   K 4.4 03/02/2021   CL 103 03/02/2021   CO2 25 03/02/2021   Lab Results  Component Value Date   CHOL 144 03/02/2021   HDL 44 03/02/2021   LDLCALC 81 03/02/2021   TRIG 105 03/02/2021   CHOLHDL 3.3 03/02/2021   Lab Results  Component Value Date   TSH 1.190 03/02/2021   Lab Results  Component Value Date   HGBA1C 6.2 (H) 03/02/2021   Lab Results  Component Value Date   WBC 5.8 03/02/2021   HGB 13.6 03/02/2021   HCT 41.0 03/02/2021   MCV 83 03/02/2021   PLT 193 03/02/2021   Lab Results  Component Value Date   ALT 12 03/02/2021   AST 19 03/02/2021   ALKPHOS 97 03/02/2021   BILITOT  0.3 03/02/2021     Review of Systems  Constitutional:  Negative for fatigue and unexpected weight change.  HENT:  Negative for nosebleeds.   Eyes:  Negative for visual disturbance.  Respiratory:  Negative for cough, chest tightness, shortness of breath and wheezing.   Cardiovascular:  Positive for leg swelling. Negative for chest pain and palpitations.  Gastrointestinal:  Negative for abdominal pain, constipation and diarrhea.  Neurological:  Negative for dizziness, weakness, light-headedness and headaches.  Psychiatric/Behavioral:  Positive for confusion.    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   Essential hypertension 10/31/2014   Hyperlipidemia associated with type 2 diabetes mellitus ([place]) 10/31/2014   Hx of gastric ulcer 10/31/2014   Has a tremor 10/31/2014   Avitaminosis D 10/31/2014   Type II diabetes mellitus with complication (HCC) 28/78/6767    Allergies  Allergen Reactions   Penicillins Swelling    Past Surgical History:  Procedure Laterality Date   ESOPHAGOGASTRODUODENOSCOPY  07/2014   grade D esophagitis; prepyloric ulcer   EYE SURGERY Bilateral    cataracts   TONSILLECTOMY     UMBILICAL HERNIA REPAIR      Social History   Tobacco Use   Smoking status: Never   Smokeless tobacco: Never   Tobacco comments:    smoking cessation materials not  required  Vaping Use   Vaping Use: Never used  Substance Use Topics   Alcohol use: Yes    Alcohol/week: 0.0 standard drinks    Comment: occasionally once a year   Drug use: No     Medication list has been reviewed and updated.  Current Meds  Medication Sig   ACCU-CHEK SOFTCLIX LANCETS lancets    Acetylcysteine (NAC 600 PO) Take by mouth daily.   Apoaequorin (PREVAGEN PO) Take by mouth daily.   aspirin 81 MG tablet Take 81 mg by mouth every evening.   Blood Glucose Monitoring Suppl  (ACCU-CHEK AVIVA PLUS) W/DEVICE KIT U UTD   cholecalciferol (VITAMIN D) 1000 UNITS tablet Take 1,000 Units by mouth daily.   glipiZIDE (GLUCOTROL) 5 MG tablet Take 1 tablet (5 mg total) by mouth 2 (two) times daily before a meal.   glucose blood test strip    lisinopril (ZESTRIL) 5 MG tablet TAKE 1 TABLET(5 MG) BY MOUTH DAILY   lovastatin (MEVACOR) 40 MG tablet Take 1 tablet (40 mg total) by mouth every evening.   magnesium 30 MG tablet Take 400 mg by mouth 2 (two) times daily.   metFORMIN (GLUCOPHAGE-XR) 750 MG 24 hr tablet TAKE 1 TABLET(750 MG) BY MOUTH TWICE DAILY (Patient taking differently: daily.)   Multiple Vitamin (MULTIVITAMIN WITH MINERALS) TABS tablet Take 1 tablet by mouth daily.   pantoprazole (PROTONIX) 40 MG tablet TAKE 1 TABLET(40 MG) BY MOUTH DAILY   vitamin C (ASCORBIC ACID) 500 MG tablet Take by mouth.    PHQ 2/9 Scores 05/05/2021 03/02/2021 10/28/2020 04/30/2020  PHQ - 2 Score 0 0 3 0  PHQ- 9 Score 0 0 6 0    GAD 7 : Generalized Anxiety Score 05/05/2021 03/02/2021 10/28/2020 04/30/2020  Nervous, Anxious, on Edge 0 0 0 0  Control/stop worrying 0 0 0 0  Worry too much - different things 0 0 1 0  Trouble relaxing 0 0 0 0  Restless 0 0 0 0  Easily annoyed or irritable 0 0 0 0  Afraid - awful might happen 0 0 0 0  Total GAD 7 Score 0 0 1 0  Anxiety Difficulty Not difficult at all - Not difficult at all Not difficult at all    BP Readings from Last 3 Encounters:  05/05/21 126/74  03/02/21 140/76  10/28/20 140/80    Physical Exam Vitals and nursing note reviewed.  Constitutional:      General: She is not in acute distress.    Appearance: Normal appearance. She is well-developed.  HENT:     Head: Normocephalic and atraumatic.  Cardiovascular:     Rate and Rhythm: Normal rate and regular rhythm.     Pulses: Normal pulses.     Heart sounds: No murmur heard. Pulmonary:     Effort: Pulmonary effort is normal. No respiratory distress.     Breath sounds: No wheezing  or rhonchi.  Musculoskeletal:     Cervical back: Normal range of motion.     Right lower leg: Edema present.     Left lower leg: Edema present.     Comments: Spider veins around both ankles;  no open wounds Calf non tender, no cord  Lymphadenopathy:     Cervical: No cervical adenopathy.  Skin:    General: Skin is warm and dry.     Capillary Refill: Capillary refill takes less than 2 seconds.     Findings: No rash.  Neurological:     General: No focal deficit present.  Mental Status: She is alert and oriented to person, place, and time.  Psychiatric:        Mood and Affect: Mood normal.        Behavior: Behavior normal.    Wt Readings from Last 3 Encounters:  05/05/21 178 lb 3.2 oz (80.8 kg)  03/02/21 171 lb (77.6 kg)  10/28/20 176 lb (79.8 kg)    BP 126/74   Pulse 78   Ht 5' 4" (1.626 m)   Wt 178 lb 3.2 oz (80.8 kg)   SpO2 97%   BMI 30.59 kg/m   Assessment and Plan: 1. Type II diabetes mellitus with complication (HCC) Clinically stable by exam and report without s/s of hypoglycemia. DM complicated by hypertension and dyslipidemia. Tolerating medications well without side effects or other concerns. Continue metformin once a day; reduce glimepiride to once a day in the am - POCT glycosylated hemoglobin (Hb A1C) =- 5.6  2. Hyperlipidemia associated with type 2 diabetes mellitus ([place]) controlled  3. Gastrointestinal ulcer due to Helicobacter pylori Continue current medications  4. Localized edema Begin lasix 20 mg daily x 3 days then skip 7 days and repeat as needed. Elevate legs "toes above the nose" Increase free water intake. Follow up if worsening. - furosemide (LASIX) 20 MG tablet; Take 1 tablet (20 mg total) by mouth daily as needed.  Dispense: 15 tablet; Refill: 0   Partially dictated using Editor, commissioning. Any errors are unintentional.  Halina Maidens, MD [facility]  05/05/2021

## 2021-05-05 NOTE — Patient Instructions (Addendum)
Take Lasix 20 mg daily for three days then skip 7 days.  Repeat every 10 days if needed.  Stop the evening dose of glimepiride.

## 2021-05-20 ENCOUNTER — Telehealth: Payer: Self-pay | Admitting: Internal Medicine

## 2021-05-20 DIAGNOSIS — E1169 Type 2 diabetes mellitus with other specified complication: Secondary | ICD-10-CM

## 2021-05-20 DIAGNOSIS — E118 Type 2 diabetes mellitus with unspecified complications: Secondary | ICD-10-CM

## 2021-05-20 DIAGNOSIS — E785 Hyperlipidemia, unspecified: Secondary | ICD-10-CM

## 2021-05-22 ENCOUNTER — Other Ambulatory Visit: Payer: Self-pay | Admitting: Internal Medicine

## 2021-05-22 DIAGNOSIS — B9681 Helicobacter pylori [H. pylori] as the cause of diseases classified elsewhere: Secondary | ICD-10-CM

## 2021-05-22 NOTE — Telephone Encounter (Signed)
Requested medication (s) are due for refill today: NO  Requested medication (s) are on the active medication list: dose inconsistent  Last refill:  02/09/21  Future visit scheduled: 06/04/21  Notes to clinic:   Dose inconsistent with current med list, please assess.     Requested Prescriptions  Pending Prescriptions Disp Refills   glipiZIDE (GLUCOTROL) 5 MG tablet [Pharmacy Med Name: GLIPIZIDE 5MG  TABLETS] 180 tablet     Sig: TAKE 1 TABLET(5 MG) BY MOUTH TWICE DAILY BEFORE A MEAL     Endocrinology:  Diabetes - Sulfonylureas Passed - 05/20/2021  4:22 PM      Passed - HBA1C is between 0 and 7.9 and within 180 days    Hemoglobin A1C  Date Value Ref Range Status  05/05/2021 5.6 4.0 - 5.6 % Final   Hgb A1c MFr Bld  Date Value Ref Range Status  03/02/2021 6.2 (H) 4.8 - 5.6 % Final    Comment:             Prediabetes: 5.7 - 6.4          Diabetes: >6.4          Glycemic control for adults with diabetes: <7.0           Passed - Valid encounter within last 6 months    Recent Outpatient Visits           2 weeks ago Localized edema   [facility] [place], [facility]   2 months ago Essential hypertension   [facility] Glean Hess, MD   6 months ago Essential hypertension   [facility] Glean Hess, MD   1 year ago Essential hypertension   [place], Laura H, MD   1 year ago Essential hypertension   [facility] Glean Hess, MD              Signed Prescriptions Disp Refills   lovastatin (MEVACOR) 40 MG tablet 90 tablet 2    Sig: TAKE 1 TABLET(40 MG) BY MOUTH EVERY EVENING     Cardiovascular:  Antilipid - Statins Passed - 05/20/2021  4:22 PM      Passed - Total Cholesterol in normal range and within 360 days    Cholesterol, Total  Date Value Ref Range Status  03/02/2021 144 100 - 199 mg/dL Final          Passed - LDL in normal range and within 360 days    LDL Chol Calc (NIH)  Date  Value Ref Range Status  03/02/2021 81 0 - 99 mg/dL Final          Passed - HDL in normal range and within 360 days    HDL  Date Value Ref Range Status  03/02/2021 44 >39 mg/dL Final          Passed - Triglycerides in normal range and within 360 days    Triglycerides  Date Value Ref Range Status  03/02/2021 105 0 - 149 mg/dL Final          Passed - Patient is not pregnant      Passed - Valid encounter within last 12 months    Recent Outpatient Visits           2 weeks ago Localized edema   [facility] Glean [facility], MD   2 months ago Essential hypertension   [facility] Glean Hess, MD   6 months ago Essential  hypertension   [facility] Glean Hess, MD   1 year ago Essential hypertension   [facility] Glean Hess, MD   1 year ago Essential hypertension   [facility] Glean Hess, MD

## 2021-05-22 NOTE — Telephone Encounter (Signed)
Requested Prescriptions  Pending Prescriptions Disp Refills  . glipiZIDE (GLUCOTROL) 5 MG tablet [Pharmacy Med Name: GLIPIZIDE 5MG  TABLETS] 180 tablet     Sig: TAKE 1 TABLET(5 MG) BY MOUTH TWICE DAILY BEFORE A MEAL     Endocrinology:  Diabetes - Sulfonylureas Passed - 05/20/2021  4:22 PM      Passed - HBA1C is between 0 and 7.9 and within 180 days    Hemoglobin A1C  Date Value Ref Range Status  05/05/2021 5.6 4.0 - 5.6 % Final   Hgb A1c MFr Bld  Date Value Ref Range Status  03/02/2021 6.2 (H) 4.8 - 5.6 % Final    Comment:             Prediabetes: 5.7 - 6.4          Diabetes: >6.4          Glycemic control for adults with diabetes: <7.0          Passed - Valid encounter within last 6 months    Recent Outpatient Visits          2 weeks ago Localized edema   [facility] Glean [facility], [facility]   2 months ago Essential hypertension   [facility] Glean Hess, MD   6 months ago Essential hypertension   [facility] Glean Hess, MD   1 year ago Essential hypertension   [facility] Glean Hess, [facility]   1 year ago Essential hypertension   [facility] Glean Hess, [facility]             . lovastatin (MEVACOR) 40 MG tablet [Pharmacy Med Name: LOVASTATIN 40MG  TABLETS] 90 tablet 2    Sig: TAKE 1 TABLET(40 MG) BY MOUTH EVERY EVENING     Cardiovascular:  Antilipid - Statins Passed - 05/20/2021  4:22 PM      Passed - Total Cholesterol in normal range and within 360 days    Cholesterol, Total  Date Value Ref Range Status  03/02/2021 144 100 - 199 mg/dL Final         Passed - LDL in normal range and within 360 days    LDL Chol Calc (NIH)  Date Value Ref Range Status  03/02/2021 81 0 - 99 mg/dL Final         Passed - HDL in normal range and within 360 days    HDL  Date Value Ref Range Status  03/02/2021 44 >39 mg/dL Final         Passed - Triglycerides in normal range and within 360 days    Triglycerides  Date  Value Ref Range Status  03/02/2021 105 0 - 149 mg/dL Final         Passed - Patient is not pregnant      Passed - Valid encounter within last 12 months    Recent Outpatient Visits          2 weeks ago Localized edema   [facility] Glean Hess, MD   2 months ago Essential hypertension   [place], Laura H, MD   6 months ago Essential hypertension   [place], Laura H, MD   1 year ago Essential hypertension   [facility] Glean Hess, MD   1 year ago Essential hypertension   [facility] Glean Hess, MD

## 2021-05-23 NOTE — Telephone Encounter (Signed)
Requested Prescriptions  Pending Prescriptions Disp Refills  . pantoprazole (PROTONIX) 40 MG tablet [Pharmacy Med Name: PANTOPRAZOLE 40MG  TABLETS] 90 tablet 0    Sig: TAKE 1 TABLET(40 MG) BY MOUTH DAILY     Gastroenterology: Proton Pump Inhibitors Passed - 05/22/2021 11:34 AM      Passed - Valid encounter within last 12 months    Recent Outpatient Visits          2 weeks ago Localized edema   [facility] Glean Hess, MD   2 months ago Essential hypertension   [facility] Glean Hess, MD   6 months ago Essential hypertension   [facility] Glean Hess, MD   1 year ago Essential hypertension   [facility] Glean Hess, MD   1 year ago Essential hypertension   [facility] Glean Hess, [facility]

## 2021-05-24 ENCOUNTER — Other Ambulatory Visit: Payer: Self-pay

## 2021-05-24 DIAGNOSIS — E118 Type 2 diabetes mellitus with unspecified complications: Secondary | ICD-10-CM

## 2021-05-24 MED ORDER — GLIPIZIDE 5 MG PO TABS: 5.0000 mg | ORAL_TABLET | Freq: Every day | ORAL | 0 refills | Status: DC

## 2021-05-24 NOTE — Telephone Encounter (Signed)
Patient daughter calledin to inform Dr Army Melia that pharmacy never received Rx for glipiZIDE (GLUCOTROL) 5 MG tablet  written 05/05/21. Please send Rx patient  is out

## 2021-05-24 NOTE — Telephone Encounter (Signed)
Daughter called saying [facility] said they never got this rx and it was never filled.  Mom had not been taking her meds and the daughter has taken over and needs this sent to the pharmacy.  CB#  [phone]

## 2021-05-24 NOTE — Telephone Encounter (Signed)
Called pt on number provided. Ask to speak to Santiago Glad she was not home. Told pt that we sent in her medication. She verbalized understanding.  KP

## 2021-07-23 ENCOUNTER — Other Ambulatory Visit: Payer: Self-pay

## 2021-07-23 ENCOUNTER — Encounter: Payer: Self-pay | Admitting: Internal Medicine

## 2021-07-23 ENCOUNTER — Ambulatory Visit (INDEPENDENT_AMBULATORY_CARE_PROVIDER_SITE_OTHER): Payer: Medicare Other | Admitting: Internal Medicine

## 2021-07-23 VITALS — BP 144/86 | HR 94 | Ht 64.0 in | Wt 169.0 lb

## 2021-07-23 DIAGNOSIS — E118 Type 2 diabetes mellitus with unspecified complications: Secondary | ICD-10-CM

## 2021-07-23 DIAGNOSIS — K289 Gastrojejunal ulcer, unspecified as acute or chronic, without hemorrhage or perforation: Secondary | ICD-10-CM

## 2021-07-23 DIAGNOSIS — B9681 Helicobacter pylori [H. pylori] as the cause of diseases classified elsewhere: Secondary | ICD-10-CM | POA: Diagnosis not present

## 2021-07-23 DIAGNOSIS — I10 Essential (primary) hypertension: Secondary | ICD-10-CM | POA: Diagnosis not present

## 2021-07-23 LAB — POCT GLYCOSYLATED HEMOGLOBIN (HGB A1C): Hemoglobin A1C: 6 % — AB (ref 4.0–5.6)

## 2021-07-23 MED ORDER — PANTOPRAZOLE SODIUM 40 MG PO TBEC: 40.0000 mg | DELAYED_RELEASE_TABLET | Freq: Every day | ORAL | 1 refills | Status: DC

## 2021-07-23 NOTE — Progress Notes (Signed)
Date:  07/23/2021   Name:  Penny Andrews   DOB:  1924-09-06   MRN:  [id number]   Chief Complaint: Diabetes and Hypertension  Hypertension This is a chronic problem. The problem is controlled. Pertinent negatives include no chest pain, headaches, palpitations or shortness of breath. Past treatments include ACE inhibitors (and prn Lasix). The current treatment provides moderate improvement. There is no history of kidney disease, CAD/MI or CVA.  Diabetes She presents for her follow-up diabetic visit. She has type 2 diabetes mellitus. Her disease course has been stable. Hypoglycemia symptoms include confusion. Pertinent negatives for hypoglycemia include no headaches, nervousness/anxiousness or tremors. Pertinent negatives for diabetes include no chest pain, no fatigue, no polydipsia and no polyuria. Pertinent negatives for diabetic complications include no CVA. Current diabetic treatment includes oral agent (dual therapy) (once daily metformin and glipizide). She is following a generally healthy diet. An ACE inhibitor/angiotensin II receptor blocker is being taken. She sees a podiatrist.Eye exam is not current.  Gastroesophageal Reflux She complains of heartburn. She reports no abdominal pain, no chest pain or no coughing. This is a recurrent problem. The problem occurs occasionally. Pertinent negatives include no fatigue. She has tried a PPI for the symptoms.   Lab Results  Component Value Date   NA 142 03/02/2021   K 4.4 03/02/2021   CO2 25 03/02/2021   GLUCOSE 96 03/02/2021   BUN 12 03/02/2021   CREATININE 0.66 03/02/2021   CALCIUM 9.6 03/02/2021   EGFR 82 03/02/2021   GFRNONAA 57 (L) 04/30/2020   Lab Results  Component Value Date   CHOL 144 03/02/2021   HDL 44 03/02/2021   LDLCALC 81 03/02/2021   TRIG 105 03/02/2021   CHOLHDL 3.3 03/02/2021   Lab Results  Component Value Date   TSH 1.190 03/02/2021   Lab Results  Component Value Date   HGBA1C 6.0 (A) 07/23/2021   Lab  Results  Component Value Date   WBC 5.8 03/02/2021   HGB 13.6 03/02/2021   HCT 41.0 03/02/2021   MCV 83 03/02/2021   PLT 193 03/02/2021   Lab Results  Component Value Date   ALT 12 03/02/2021   AST 19 03/02/2021   ALKPHOS 97 03/02/2021   BILITOT 0.3 03/02/2021   Lab Results  Component Value Date   VD25OH 42.3 04/30/2020     Review of Systems  Constitutional:  Positive for unexpected weight change (has lost about 9 lbs). Negative for appetite change, fatigue and fever.  HENT:  Negative for tinnitus and trouble swallowing.   Eyes:  Negative for visual disturbance.  Respiratory:  Negative for cough, chest tightness and shortness of breath.   Cardiovascular:  Positive for leg swelling (mild edema - improved from last visit). Negative for chest pain and palpitations.  Gastrointestinal:  Positive for heartburn. Negative for abdominal pain.  Endocrine: Negative for polydipsia and polyuria.  Genitourinary:  Negative for dysuria and hematuria.  Musculoskeletal:  Positive for arthralgias and gait problem.  Neurological:  Negative for tremors, numbness and headaches.  Psychiatric/Behavioral:  Positive for confusion. Negative for dysphoric mood and sleep disturbance. The patient is not nervous/anxious.    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   Essential hypertension 10/31/2014   Hyperlipidemia associated with type 2 diabetes mellitus ([place]) 10/31/2014   Hx of gastric ulcer 10/31/2014   Has a tremor 10/31/2014   Avitaminosis D  10/31/2014   Type II diabetes mellitus with complication (HCC) 93/26/7124    Allergies  Allergen Reactions   Penicillins Swelling    Past Surgical History:  Procedure Laterality Date   ESOPHAGOGASTRODUODENOSCOPY  07/2014   grade D esophagitis; prepyloric ulcer   EYE SURGERY Bilateral    cataracts   TONSILLECTOMY     UMBILICAL HERNIA  REPAIR      Social History   Tobacco Use   Smoking status: Never   Smokeless tobacco: Never   Tobacco comments:    smoking cessation materials not required  Vaping Use   Vaping Use: Never used  Substance Use Topics   Alcohol use: Yes    Alcohol/week: 0.0 standard drinks    Comment: occasionally once a year   Drug use: No     Medication list has been reviewed and updated.  Current Meds  Medication Sig   Acetylcysteine (NAC 600 PO) Take by mouth daily.   Apoaequorin (PREVAGEN PO) Take by mouth daily.   aspirin 81 MG tablet Take 81 mg by mouth every evening.   cholecalciferol (VITAMIN D) 1000 UNITS tablet Take 1,000 Units by mouth daily.   lisinopril (ZESTRIL) 5 MG tablet TAKE 1 TABLET(5 MG) BY MOUTH DAILY   lovastatin (MEVACOR) 40 MG tablet TAKE 1 TABLET(40 MG) BY MOUTH EVERY EVENING   magnesium 30 MG tablet Take 400 mg by mouth daily.   metFORMIN (GLUCOPHAGE-XR) 750 MG 24 hr tablet Take 1 tablet (750 mg total) by mouth daily with breakfast.   Multiple Vitamin (MULTIVITAMIN WITH MINERALS) TABS tablet Take 1 tablet by mouth daily.   vitamin C (ASCORBIC ACID) 500 MG tablet Take by mouth.   [DISCONTINUED] glipiZIDE (GLUCOTROL) 5 MG tablet Take 1 tablet (5 mg total) by mouth daily before breakfast.   [DISCONTINUED] pantoprazole (PROTONIX) 40 MG tablet TAKE 1 TABLET(40 MG) BY MOUTH DAILY    PHQ 2/9 Scores 07/23/2021 05/05/2021 03/02/2021 10/28/2020  PHQ - 2 Score 0 0 0 3  PHQ- 9 Score 0 0 0 6    GAD 7 : Generalized Anxiety Score 07/23/2021 05/05/2021 03/02/2021 10/28/2020  Nervous, Anxious, on Edge 0 0 0 0  Control/stop worrying 0 0 0 0  Worry too much - different things 0 0 0 1  Trouble relaxing 0 0 0 0  Restless 0 0 0 0  Easily annoyed or irritable 0 0 0 0  Afraid - awful might happen 0 0 0 0  Total GAD 7 Score 0 0 0 1  Anxiety Difficulty - Not difficult at all - Not difficult at all    BP Readings from Last 3 Encounters:  07/23/21 (!) 144/86  05/05/21 126/74  03/02/21  140/76    Physical Exam Vitals and nursing note reviewed.  Constitutional:      General: She is not in acute distress.    Appearance: Normal appearance. She is well-developed.  HENT:     Head: Normocephalic and atraumatic.  Cardiovascular:     Rate and Rhythm: Normal rate and regular rhythm.     Pulses: Normal pulses.  Pulmonary:     Effort: Pulmonary effort is normal. No respiratory distress.     Breath sounds: No wheezing or rhonchi.  Musculoskeletal:     Cervical back: Normal range of motion.     Right lower leg: Edema present.     Left lower leg: Edema present.  Lymphadenopathy:     Cervical: No cervical adenopathy.  Skin:    General: Skin is warm and dry.  Capillary Refill: Capillary refill takes less than 2 seconds.     Findings: No rash.  Neurological:     General: No focal deficit present.     Mental Status: She is alert and oriented to person, place, and time.  Psychiatric:        Mood and Affect: Mood normal.        Behavior: Behavior normal.    Wt Readings from Last 3 Encounters:  07/23/21 169 lb (76.7 kg)  05/05/21 178 lb 3.2 oz (80.8 kg)  03/02/21 171 lb (77.6 kg)    BP (!) 144/86    Pulse 94    Ht '5\' 4"'  (1.626 m)    Wt 169 lb (76.7 kg)    SpO2 97%    BMI 29.01 kg/m   Assessment and Plan: 1. Essential hypertension Clinically stable exam with fairly well controlled BP. May need to resume PRN lasix. Tolerating medications without side effects at this time. Pt to continue current regimen and low sodium diet;   2. Type II diabetes mellitus with complication (HCC) Clinically stable by exam and report without s/s of hypoglycemia. DM complicated by hypertension and dyslipidemia. Tolerating medications well without side effects or other concerns. D/C glipizide but continue metformin - POCT glycosylated hemoglobin (Hb A1C) = 6.0  3. Gastrointestinal ulcer due to Helicobacter pylori Symptoms well controlled on daily PPI No red flag signs such as weight  loss, n/v, melena Will continue Protonix. - pantoprazole (PROTONIX) 40 MG tablet; Take 1 tablet (40 mg total) by mouth daily.  Dispense: 90 tablet; Refill: 1   Partially dictated using Editor, commissioning. Any errors are unintentional.  Halina Maidens, MD [facility]  07/23/2021

## 2021-08-09 DIAGNOSIS — M79675 Pain in left toe(s): Secondary | ICD-10-CM | POA: Diagnosis not present

## 2021-08-09 DIAGNOSIS — B351 Tinea unguium: Secondary | ICD-10-CM | POA: Diagnosis not present

## 2021-08-09 DIAGNOSIS — M79674 Pain in right toe(s): Secondary | ICD-10-CM | POA: Diagnosis not present

## 2021-08-18 ENCOUNTER — Other Ambulatory Visit: Payer: Self-pay | Admitting: Internal Medicine

## 2021-08-18 DIAGNOSIS — E118 Type 2 diabetes mellitus with unspecified complications: Secondary | ICD-10-CM

## 2021-08-18 NOTE — Telephone Encounter (Signed)
Requested medication (s) are due for refill today: yes ? ?Requested medication (s) are on the active medication list: no ? ?Last refill:  05/24/21  ? ?Future visit scheduled: no ? ?Notes to clinic:  medication was dc'd on 07/23/21. Please advise ? ? ?  ?Requested Prescriptions  ?Pending Prescriptions Disp Refills  ? glipiZIDE (GLUCOTROL) 5 MG tablet [Pharmacy Med Name: GLIPIZIDE 5MG  TABLETS] 90 tablet 0  ?  Sig: TAKE 1 TABLET(5 MG) BY MOUTH DAILY BEFORE BREAKFAST  ?  ? Endocrinology:  Diabetes - Sulfonylureas Passed - 08/18/2021  6:17 AM  ?  ?  Passed - HBA1C is between 0 and 7.9 and within 180 days  ?  Hemoglobin A1C  ?Date Value Ref Range Status  ?07/23/2021 6.0 (A) 4.0 - 5.6 % Final  ? ?Hgb A1c MFr Bld  ?Date Value Ref Range Status  ?03/02/2021 6.2 (H) 4.8 - 5.6 % Final  ?  Comment:  ?           Prediabetes: 5.7 - 6.4 ?         Diabetes: >6.4 ?         Glycemic control for adults with diabetes: <7.0 ?  ?  ?  ?  ?  Passed - Cr in normal range and within 360 days  ?  Creatinine, Ser  ?Date Value Ref Range Status  ?03/02/2021 0.66 0.57 - 1.00 mg/dL Final  ?  ?  ?  ?  Passed - Valid encounter within last 6 months  ?  Recent Outpatient Visits   ? ?      ? 3 weeks ago Essential hypertension  ? [facility] Glean Hess, MD  ? 3 months ago Localized edema  ? [facility] Glean Hess, MD  ? 5 months ago Essential hypertension  ? [facility] Glean Hess, MD  ? 9 months ago Essential hypertension  ? [facility] Glean Hess, MD  ? 1 year ago Essential hypertension  ? [facility] Glean Hess, MD  ? ?  ?  ? ?  ?  ?  ? ?

## 2021-09-01 DIAGNOSIS — Z961 Presence of intraocular lens: Secondary | ICD-10-CM | POA: Diagnosis not present

## 2021-09-01 LAB — HM DIABETES EYE EXAM

## 2021-10-19 DIAGNOSIS — R002 Palpitations: Secondary | ICD-10-CM | POA: Diagnosis not present

## 2021-10-19 DIAGNOSIS — I10 Essential (primary) hypertension: Secondary | ICD-10-CM | POA: Diagnosis not present

## 2021-10-19 DIAGNOSIS — E782 Mixed hyperlipidemia: Secondary | ICD-10-CM | POA: Diagnosis not present

## 2021-11-11 ENCOUNTER — Other Ambulatory Visit: Payer: Self-pay | Admitting: Internal Medicine

## 2021-11-11 DIAGNOSIS — I10 Essential (primary) hypertension: Secondary | ICD-10-CM

## 2021-11-12 ENCOUNTER — Other Ambulatory Visit: Payer: Self-pay | Admitting: Internal Medicine

## 2021-11-12 DIAGNOSIS — I10 Essential (primary) hypertension: Secondary | ICD-10-CM

## 2021-11-12 NOTE — Telephone Encounter (Signed)
Requested medication (s) are due for refill today: yes  Requested medication (s) are on the active medication list: yes  Last refill:  04/22/21 #90 1   Future visit scheduled: no  Notes to clinic:  overdue lab work   Requested Prescriptions  Pending Prescriptions Disp Refills   lisinopril (ZESTRIL) 5 MG tablet [Pharmacy Med Name: LISINOPRIL '5MG'$  TABLETS] 90 tablet 1    Sig: TAKE 1 TABLET(5 MG) BY MOUTH DAILY     Cardiovascular:  ACE Inhibitors Failed - 11/11/2021  6:20 AM      Failed - Cr in normal range and within 180 days    Creatinine, Ser  Date Value Ref Range Status  03/02/2021 0.66 0.57 - 1.00 mg/dL Final         Failed - K in normal range and within 180 days    Potassium  Date Value Ref Range Status  03/02/2021 4.4 3.5 - 5.2 mmol/L Final         Failed - Last BP in normal range    BP Readings from Last 1 Encounters:  07/23/21 (!) 144/86         Passed - Patient is not pregnant      Passed - Valid encounter within last 6 months    Recent Outpatient Visits           3 months ago Essential hypertension   [place], [place]   6 months ago Localized edema   [facility] Glean Hess, MD   8 months ago Essential hypertension   [facility] Glean Hess, MD   1 year ago Essential hypertension   [facility] Glean Hess, MD   1 year ago Essential hypertension   [facility] Glean Hess, MD

## 2021-11-16 ENCOUNTER — Other Ambulatory Visit: Payer: Self-pay | Admitting: Internal Medicine

## 2021-11-16 DIAGNOSIS — I10 Essential (primary) hypertension: Secondary | ICD-10-CM

## 2021-11-16 NOTE — Telephone Encounter (Signed)
Due follow up- office filled 11/12/21 #30 Requested Prescriptions  Pending Prescriptions Disp Refills  . lisinopril (ZESTRIL) 5 MG tablet [Pharmacy Med Name: LISINOPRIL '5MG'$  TABLETS] 90 tablet     Sig: TAKE 1 TABLET(5 MG) BY MOUTH DAILY     Cardiovascular:  ACE Inhibitors Failed - 11/12/2021  3:32 PM      Failed - Cr in normal range and within 180 days    Creatinine, Ser  Date Value Ref Range Status  03/02/2021 0.66 0.57 - 1.00 mg/dL Final         Failed - K in normal range and within 180 days    Potassium  Date Value Ref Range Status  03/02/2021 4.4 3.5 - 5.2 mmol/L Final         Failed - Last BP in normal range    BP Readings from Last 1 Encounters:  07/23/21 (!) 144/86         Passed - Patient is not pregnant      Passed - Valid encounter within last 6 months    Recent Outpatient Visits          3 months ago Essential hypertension   [place], MD   6 months ago Localized edema   [facility] Glean Hess, MD   8 months ago Essential hypertension   [facility] Glean Hess, MD   1 year ago Essential hypertension   [facility] Glean Hess, MD   1 year ago Essential hypertension   [facility] Glean Hess, MD

## 2021-11-17 NOTE — Telephone Encounter (Signed)
Refilled 11/12/21.

## 2021-11-22 DIAGNOSIS — C44319 Basal cell carcinoma of skin of other parts of face: Secondary | ICD-10-CM | POA: Diagnosis not present

## 2021-11-22 DIAGNOSIS — D485 Neoplasm of uncertain behavior of skin: Secondary | ICD-10-CM | POA: Diagnosis not present

## 2021-11-22 DIAGNOSIS — L821 Other seborrheic keratosis: Secondary | ICD-10-CM | POA: Diagnosis not present

## 2021-11-30 ENCOUNTER — Encounter: Payer: Self-pay | Admitting: Internal Medicine

## 2021-11-30 ENCOUNTER — Ambulatory Visit (INDEPENDENT_AMBULATORY_CARE_PROVIDER_SITE_OTHER): Payer: Medicare Other | Admitting: Internal Medicine

## 2021-11-30 VITALS — BP 128/80 | HR 75 | Ht 64.0 in | Wt 166.2 lb

## 2021-11-30 DIAGNOSIS — C44319 Basal cell carcinoma of skin of other parts of face: Secondary | ICD-10-CM | POA: Diagnosis not present

## 2021-11-30 DIAGNOSIS — C4491 Basal cell carcinoma of skin, unspecified: Secondary | ICD-10-CM | POA: Insufficient documentation

## 2021-11-30 DIAGNOSIS — H6122 Impacted cerumen, left ear: Secondary | ICD-10-CM

## 2021-11-30 DIAGNOSIS — E118 Type 2 diabetes mellitus with unspecified complications: Secondary | ICD-10-CM | POA: Diagnosis not present

## 2021-11-30 DIAGNOSIS — E1169 Type 2 diabetes mellitus with other specified complication: Secondary | ICD-10-CM

## 2021-11-30 DIAGNOSIS — E785 Hyperlipidemia, unspecified: Secondary | ICD-10-CM

## 2021-11-30 DIAGNOSIS — I10 Essential (primary) hypertension: Secondary | ICD-10-CM | POA: Diagnosis not present

## 2021-11-30 NOTE — Progress Notes (Signed)
Date:  11/30/2021   Name:  Penny Andrews   DOB:  Oct 21, 1924   MRN:  [id number]   Chief Complaint: Ear Fullness and Hypertension  Hypertension This is a chronic problem. The problem is controlled. Pertinent negatives include no chest pain, headaches, palpitations or shortness of breath. Past treatments include ACE inhibitors and diuretics.  Diabetes She presents for her follow-up diabetic visit. She has type 2 diabetes mellitus. Her disease course has been stable. Pertinent negatives for hypoglycemia include no headaches or tremors. Pertinent negatives for diabetes include no chest pain, no fatigue, no polydipsia and no polyuria. Current diabetic treatment includes oral agent (monotherapy) (metformin alone). An ACE inhibitor/angiotensin II receptor blocker is being taken.    Lab Results  Component Value Date   NA 142 03/02/2021   K 4.4 03/02/2021   CO2 25 03/02/2021   GLUCOSE 96 03/02/2021   BUN 12 03/02/2021   CREATININE 0.66 03/02/2021   CALCIUM 9.6 03/02/2021   EGFR 82 03/02/2021   GFRNONAA 57 (L) 04/30/2020   Lab Results  Component Value Date   CHOL 144 03/02/2021   HDL 44 03/02/2021   LDLCALC 81 03/02/2021   TRIG 105 03/02/2021   CHOLHDL 3.3 03/02/2021   Lab Results  Component Value Date   TSH 1.190 03/02/2021   Lab Results  Component Value Date   HGBA1C 6.0 (A) 07/23/2021   Lab Results  Component Value Date   WBC 5.8 03/02/2021   HGB 13.6 03/02/2021   HCT 41.0 03/02/2021   MCV 83 03/02/2021   PLT 193 03/02/2021   Lab Results  Component Value Date   ALT 12 03/02/2021   AST 19 03/02/2021   ALKPHOS 97 03/02/2021   BILITOT 0.3 03/02/2021   Lab Results  Component Value Date   VD25OH 42.3 04/30/2020     Review of Systems  Constitutional:  Negative for appetite change, fatigue, fever and unexpected weight change.  HENT:  Positive for hearing loss. Negative for tinnitus and trouble swallowing.   Eyes:  Negative for visual disturbance.  Respiratory:   Negative for cough, chest tightness and shortness of breath.   Cardiovascular:  Negative for chest pain, palpitations and leg swelling.  Gastrointestinal:  Negative for abdominal pain.  Endocrine: Negative for polydipsia and polyuria.  Genitourinary:  Negative for dysuria and hematuria.  Musculoskeletal:  Negative for arthralgias.  Neurological:  Negative for tremors, numbness and headaches.  Psychiatric/Behavioral:  Negative for dysphoric mood.     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   Essential hypertension 10/31/2014   Hyperlipidemia associated with type 2 diabetes mellitus ([place]) 10/31/2014   Hx of gastric ulcer 10/31/2014   Has a tremor 10/31/2014   Avitaminosis D 10/31/2014   Type II diabetes mellitus with complication ([place]) 00/93/8182    Allergies  Allergen Reactions   Penicillins Swelling    Past Surgical History:  Procedure Laterality Date   ESOPHAGOGASTRODUODENOSCOPY  07/2014   grade D esophagitis; prepyloric ulcer   EYE SURGERY Bilateral    cataracts   TONSILLECTOMY     UMBILICAL HERNIA REPAIR      Social History   Tobacco Use   Smoking status: Never   Smokeless tobacco: Never   Tobacco comments:    smoking cessation materials not required  Vaping Use   Vaping Use: Never used  Substance Use Topics   Alcohol use: Yes  Alcohol/week: 0.0 standard drinks of alcohol    Comment: occasionally once a year   Drug use: No     Medication list has been reviewed and updated.  Current Meds  Medication Sig   ACCU-CHEK SOFTCLIX LANCETS lancets    Acetylcysteine (NAC 600 PO) Take by mouth daily.   Apoaequorin (PREVAGEN PO) Take by mouth daily.   aspirin 81 MG tablet Take 81 mg by mouth every evening.   Blood Glucose Monitoring Suppl (ACCU-CHEK AVIVA PLUS) W/DEVICE KIT    cholecalciferol (VITAMIN D) 1000 UNITS tablet Take 1,000 Units by  mouth daily.   furosemide (LASIX) 20 MG tablet Take 1 tablet (20 mg total) by mouth daily as needed.   glucose blood test strip    lisinopril (ZESTRIL) 5 MG tablet TAKE 1 TABLET(5 MG) BY MOUTH DAILY   lovastatin (MEVACOR) 40 MG tablet TAKE 1 TABLET(40 MG) BY MOUTH EVERY EVENING   magnesium 30 MG tablet Take 400 mg by mouth daily.   metFORMIN (GLUCOPHAGE-XR) 750 MG 24 hr tablet Take 1 tablet (750 mg total) by mouth daily with breakfast.   Multiple Vitamin (MULTIVITAMIN WITH MINERALS) TABS tablet Take 1 tablet by mouth daily.   pantoprazole (PROTONIX) 40 MG tablet Take 1 tablet (40 mg total) by mouth daily.   vitamin C (ASCORBIC ACID) 500 MG tablet Take by mouth.       07/23/2021    3:00 PM 05/05/2021    2:57 PM 03/02/2021    2:45 PM 10/28/2020    2:52 PM  GAD 7 : Generalized Anxiety Score  Nervous, Anxious, on Edge 0 0 0 0  Control/stop worrying 0 0 0 0  Worry too much - different things 0 0 0 1  Trouble relaxing 0 0 0 0  Restless 0 0 0 0  Easily annoyed or irritable 0 0 0 0  Afraid - awful might happen 0 0 0 0  Total GAD 7 Score 0 0 0 1  Anxiety Difficulty  Not difficult at all  Not difficult at all       07/23/2021    2:59 PM  Depression screen PHQ 2/9  Decreased Interest 0  Down, Depressed, Hopeless 0  PHQ - 2 Score 0  Altered sleeping 0  Tired, decreased energy 0  Change in appetite 0  Feeling bad or failure about yourself  0  Trouble concentrating 0  Moving slowly or fidgety/restless 0  Suicidal thoughts 0  PHQ-9 Score 0  Difficult doing work/chores Not difficult at all    BP Readings from Last 3 Encounters:  11/30/21 128/80  07/23/21 (!) 144/86  05/05/21 126/74    Physical Exam Vitals and nursing note reviewed.  Constitutional:      General: She is not in acute distress.    Appearance: She is well-developed.  HENT:     Head: Normocephalic and atraumatic.     Right Ear: Tympanic membrane and ear canal normal.     Left Ear: There is impacted cerumen.      Ears:     Comments: After flushing a significant amount of cerumen is removed There is scant bleeding deep in the left canal. Patient reports improved hearing Cardiovascular:     Rate and Rhythm: Normal rate and regular rhythm.  Pulmonary:     Effort: Pulmonary effort is normal. No respiratory distress.     Breath sounds: No wheezing or rhonchi.  Musculoskeletal:     Right lower leg: Edema (trace) present.     Left lower  leg: Edema (trace) present.  Skin:    General: Skin is warm and dry.     Capillary Refill: Capillary refill takes less than 2 seconds.     Findings: No rash.  Neurological:     General: No focal deficit present.     Mental Status: She is alert and oriented to person, place, and time.  Psychiatric:        Mood and Affect: Mood normal.        Behavior: Behavior normal.     Wt Readings from Last 3 Encounters:  11/30/21 166 lb 3.2 oz (75.4 kg)  07/23/21 169 lb (76.7 kg)  05/05/21 178 lb 3.2 oz (80.8 kg)    BP 128/80   Pulse 75   Ht '5\' 4"'  (1.626 m)   Wt 166 lb 3.2 oz (75.4 kg)   SpO2 96%   BMI 28.53 kg/m   Assessment and Plan: 1. Essential hypertension Clinically stable exam with well controlled BP. Tolerating medications without side effects at this time. Pt to continue current regimen and low sodium diet; benefits of regular exercise as able discussed. - Basic metabolic panel  2. Type II diabetes mellitus with complication (HCC) Clinically stable by exam and report without s/s of hypoglycemia, now on metformin alone. DM complicated by hypertension and dyslipidemia. Tolerating medications well without side effects or other concerns. - Hemoglobin A1c - Microalbumin / creatinine urine ratio  3. Hyperlipidemia associated with type 2 diabetes mellitus ([place]) On statin therapy  4. Impacted cerumen of left ear Successfully flushed with improvement in decreased hearing Recommend a drop of H2O2 daily for the next few days  5. Basal cell carcinoma (BCC)  of skin of other part of face Follow up with Dr. Phillip Heal for further excision.   Partially dictated using Editor, commissioning. Any errors are unintentional.  Halina Maidens, MD [facility]  11/30/2021

## 2021-12-01 LAB — MICROALBUMIN / CREATININE URINE RATIO
Creatinine, Urine: 57 mg/dL
Microalb/Creat Ratio: 59 mg/g creat — ABNORMAL HIGH (ref 0–29)
Microalbumin, Urine: 33.5 ug/mL

## 2021-12-01 LAB — HEMOGLOBIN A1C
Est. average glucose Bld gHb Est-mCnc: 148 mg/dL
Hgb A1c MFr Bld: 6.8 % — ABNORMAL HIGH (ref 4.8–5.6)

## 2021-12-01 LAB — BASIC METABOLIC PANEL
BUN/Creatinine Ratio: 22 (ref 12–28)
BUN: 15 mg/dL (ref 10–36)
CO2: 26 mmol/L (ref 20–29)
Calcium: 9.9 mg/dL (ref 8.7–10.3)
Chloride: 102 mmol/L (ref 96–106)
Creatinine, Ser: 0.68 mg/dL (ref 0.57–1.00)
Glucose: 99 mg/dL (ref 70–99)
Potassium: 5.1 mmol/L (ref 3.5–5.2)
Sodium: 141 mmol/L (ref 134–144)
eGFR: 81 mL/min/{1.73_m2} (ref >59)

## 2021-12-20 DIAGNOSIS — M79674 Pain in right toe(s): Secondary | ICD-10-CM | POA: Diagnosis not present

## 2021-12-20 DIAGNOSIS — I87303 Chronic venous hypertension (idiopathic) without complications of bilateral lower extremity: Secondary | ICD-10-CM | POA: Diagnosis not present

## 2021-12-20 DIAGNOSIS — E119 Type 2 diabetes mellitus without complications: Secondary | ICD-10-CM | POA: Diagnosis not present

## 2021-12-20 DIAGNOSIS — B351 Tinea unguium: Secondary | ICD-10-CM | POA: Diagnosis not present

## 2021-12-20 DIAGNOSIS — M79675 Pain in left toe(s): Secondary | ICD-10-CM | POA: Diagnosis not present

## 2022-01-03 ENCOUNTER — Other Ambulatory Visit: Payer: Self-pay | Admitting: Internal Medicine

## 2022-01-03 DIAGNOSIS — I10 Essential (primary) hypertension: Secondary | ICD-10-CM

## 2022-01-03 DIAGNOSIS — B9681 Helicobacter pylori [H. pylori] as the cause of diseases classified elsewhere: Secondary | ICD-10-CM

## 2022-01-03 NOTE — Telephone Encounter (Signed)
Medication Refill - Medication: lisinopril (ZESTRIL) 5 MG tablet and pantoprazole (PROTONIX) 40 MG tablet  Has the patient contacted their pharmacy? Yes.  Pt told to contact provider  Preferred Pharmacy (with phone number or street name):  [id number] - [place], [place] [address] AT [place] Phone:  [phone]  Fax:  [phone]     Has the patient been seen for an appointment in the last year OR does the patient have an upcoming appointment? Yes.    Agent: Please be advised that RX refills may take up to 3 business days. We ask that you follow-up with your pharmacy.

## 2022-01-04 MED ORDER — LISINOPRIL 5 MG PO TABS: ORAL_TABLET | ORAL | 1 refills | Status: DC

## 2022-01-04 MED ORDER — PANTOPRAZOLE SODIUM 40 MG PO TBEC: 40.0000 mg | DELAYED_RELEASE_TABLET | Freq: Every day | ORAL | 3 refills | Status: DC

## 2022-01-04 NOTE — Telephone Encounter (Signed)
Requested Prescriptions  Pending Prescriptions Disp Refills  . lisinopril (ZESTRIL) 5 MG tablet 90 tablet 1    Sig: TAKE 1 TABLET(5 MG) BY MOUTH DAILY     Cardiovascular:  ACE Inhibitors Passed - 01/03/2022  2:44 PM      Passed - Cr in normal range and within 180 days    Creatinine, Ser  Date Value Ref Range Status  11/30/2021 0.68 0.57 - 1.00 mg/dL Final         Passed - K in normal range and within 180 days    Potassium  Date Value Ref Range Status  11/30/2021 5.1 3.5 - 5.2 mmol/L Final         Passed - Patient is not pregnant      Passed - Last BP in normal range    BP Readings from Last 1 Encounters:  11/30/21 128/80         Passed - Valid encounter within last 6 months    Recent Outpatient Visits          1 month ago Essential hypertension   [facility] Glean Hess, MD   5 months ago Essential hypertension   [facility] Glean Hess, MD   8 months ago Localized edema   [facility] Glean Hess, MD   10 months ago Essential hypertension   [facility] Glean Hess, MD   1 year ago Essential hypertension   [facility] Glean Hess, MD             . pantoprazole (PROTONIX) 40 MG tablet 90 tablet 3    Sig: Take 1 tablet (40 mg total) by mouth daily.     Gastroenterology: Proton Pump Inhibitors Passed - 01/03/2022  2:44 PM      Passed - Valid encounter within last 12 months    Recent Outpatient Visits          1 month ago Essential hypertension   [place], [place]   5 months ago Essential hypertension   [place], [place]   8 months ago Localized edema   [facility] Glean Hess, MD   10 months ago Essential hypertension   [facility] Glean Hess, MD   1 year ago Essential hypertension   [facility] Glean Hess, MD

## 2022-01-17 DIAGNOSIS — C4491 Basal cell carcinoma of skin, unspecified: Secondary | ICD-10-CM | POA: Diagnosis not present

## 2022-01-17 DIAGNOSIS — L988 Other specified disorders of the skin and subcutaneous tissue: Secondary | ICD-10-CM | POA: Diagnosis not present

## 2022-03-01 ENCOUNTER — Other Ambulatory Visit: Payer: Self-pay | Admitting: Internal Medicine

## 2022-03-01 DIAGNOSIS — E1169 Type 2 diabetes mellitus with other specified complication: Secondary | ICD-10-CM

## 2022-03-01 NOTE — Telephone Encounter (Unsigned)
Copied from [place] [id number]. Topic: General - Other >> Mar 01, 2022  4:03 PM Everette C wrote: Reason for CRM: Medication Refill - Medication: lovastatin (MEVACOR) 40 MG tablet [[id number]]   Has the patient contacted their pharmacy? Yes.  The patient's daughter has been directed to contact their PCP  (Agent: If no, request that the patient contact the pharmacy for the refill. If patient does not wish to contact the pharmacy document the reason why and proceed with request.) (Agent: If yes, when and what did the pharmacy advise?)  Preferred Pharmacy (with phone number or street name): [facility], [place] [address] AT [address] [place] [address] Phone: [phone] Fax: [phone] Hours: Not open 24 hours   Has the patient been seen for an appointment in the last year OR does the patient have an upcoming appointment? Yes.    Agent: Please be advised that RX refills may take up to 3 business days. We ask that you follow-up with your pharmacy.

## 2022-03-02 NOTE — Telephone Encounter (Signed)
Refilled 03/02/22 Meredeth Ide CMA  Requested Prescriptions  Refused Prescriptions Disp Refills  . lovastatin (MEVACOR) 40 MG tablet 90 tablet 2    Sig: TAKE 1 TABLET(40 MG) BY MOUTH EVERY EVENING     Cardiovascular:  Antilipid - Statins 2 Failed - 03/01/2022  4:15 PM      Failed - Lipid Panel in normal range within the last 12 months    Cholesterol, Total  Date Value Ref Range Status  03/02/2021 144 100 - 199 mg/dL Final   LDL Chol Calc (NIH)  Date Value Ref Range Status  03/02/2021 81 0 - 99 mg/dL Final   HDL  Date Value Ref Range Status  03/02/2021 44 >39 mg/dL Final   Triglycerides  Date Value Ref Range Status  03/02/2021 105 0 - 149 mg/dL Final         Passed - Cr in normal range and within 360 days    Creatinine, Ser  Date Value Ref Range Status  11/30/2021 0.68 0.57 - 1.00 mg/dL Final         Passed - Patient is not pregnant      Passed - Valid encounter within last 12 months    Recent Outpatient Visits          3 months ago Essential hypertension   [place] [facility], Jesse Sans, MD   7 months ago Essential hypertension   [place] [facility], Jesse Sans, MD   10 months ago Localized edema   [place] [facility] at [facility], Jesse Sans, MD   1 year ago Essential hypertension   [place] [facility], Jesse Sans, MD   1 year ago Essential hypertension   [place] [facility] at [facility], Jesse Sans, MD      Future Appointments            In 1 week Army Melia, Jesse Sans, MD [place] [facility] at [facility]

## 2022-03-02 NOTE — Telephone Encounter (Signed)
Requested Prescriptions  Pending Prescriptions Disp Refills  . lovastatin (MEVACOR) 40 MG tablet [Pharmacy Med Name: LOVASTATIN '40MG'$  TABLETS] 90 tablet 2    Sig: TAKE 1 TABLET(40 MG) BY MOUTH EVERY EVENING     Cardiovascular:  Antilipid - Statins 2 Failed - 03/01/2022  3:58 PM      Failed - Lipid Panel in normal range within the last 12 months    Cholesterol, Total  Date Value Ref Range Status  03/02/2021 144 100 - 199 mg/dL Final   LDL Chol Calc (NIH)  Date Value Ref Range Status  03/02/2021 81 0 - 99 mg/dL Final   HDL  Date Value Ref Range Status  03/02/2021 44 >39 mg/dL Final   Triglycerides  Date Value Ref Range Status  03/02/2021 105 0 - 149 mg/dL Final         Passed - Cr in normal range and within 360 days    Creatinine, Ser  Date Value Ref Range Status  11/30/2021 0.68 0.57 - 1.00 mg/dL Final         Passed - Patient is not pregnant      Passed - Valid encounter within last 12 months    Recent Outpatient Visits          3 months ago Essential hypertension   [place] [facility] at [facility], Jesse Sans, MD   7 months ago Essential hypertension   [place] [facility] at [facility], Jesse Sans, MD   10 months ago Localized edema   [place] [facility] at [facility], Jesse Sans, MD   1 year ago Essential hypertension   [place] [facility], Jesse Sans, MD   1 year ago Essential hypertension   [place] [facility] at [facility], Jesse Sans, MD      Future Appointments            In 1 week Army Melia, Jesse Sans, MD [place] and [facility] Medicine at [facility]

## 2022-03-14 ENCOUNTER — Encounter: Payer: Self-pay | Admitting: Internal Medicine

## 2022-03-14 ENCOUNTER — Ambulatory Visit (INDEPENDENT_AMBULATORY_CARE_PROVIDER_SITE_OTHER): Payer: Medicare Other | Admitting: Internal Medicine

## 2022-03-14 VITALS — BP 138/62 | HR 87 | Ht 64.0 in | Wt 163.0 lb

## 2022-03-14 DIAGNOSIS — I10 Essential (primary) hypertension: Secondary | ICD-10-CM

## 2022-03-14 DIAGNOSIS — G3184 Mild cognitive impairment, so stated: Secondary | ICD-10-CM | POA: Diagnosis not present

## 2022-03-14 DIAGNOSIS — H5789 Other specified disorders of eye and adnexa: Secondary | ICD-10-CM

## 2022-03-14 DIAGNOSIS — E118 Type 2 diabetes mellitus with unspecified complications: Secondary | ICD-10-CM

## 2022-03-14 DIAGNOSIS — Z23 Encounter for immunization: Secondary | ICD-10-CM | POA: Diagnosis not present

## 2022-03-14 MED ORDER — METFORMIN HCL ER 750 MG PO TB24: 750.0000 mg | ORAL_TABLET | Freq: Every day | ORAL | 1 refills | Status: DC

## 2022-03-14 NOTE — Progress Notes (Signed)
Date:  03/14/2022   Name:  Penny Andrews   DOB:  February 19, 1925   MRN:  [id number]   Chief Complaint: Dementia and Ear Fullness (Both ears. Wants ear checked. )  Diabetes She presents for her follow-up diabetic visit. She has type 2 diabetes mellitus. Hypoglycemia symptoms include confusion. Pertinent negatives for hypoglycemia include no dizziness, headaches or nervousness/anxiousness. Pertinent negatives for diabetes include no chest pain, no fatigue and no weakness. Current diabetic treatment includes oral agent (monotherapy) (metformin). An ACE inhibitor/angiotensin II receptor blocker is being taken.  Hypertension This is a chronic problem. The problem is controlled. Pertinent negatives include no chest pain, headaches or shortness of breath. Past treatments include ACE inhibitors and diuretics.  Memory issues - she is living at home - her granddaughter is currently staying there - works remotely and is available.  Daughter Penny Andrews is next door.  No major change in status per Penny Andrews.  Patient has been very hesitant to look at alternative living arrangements - she wants to stay in her house.  Lab Results  Component Value Date   NA 141 11/30/2021   K 5.1 11/30/2021   CO2 26 11/30/2021   GLUCOSE 99 11/30/2021   BUN 15 11/30/2021   CREATININE 0.68 11/30/2021   CALCIUM 9.9 11/30/2021   EGFR 81 11/30/2021   GFRNONAA 57 (L) 04/30/2020   Lab Results  Component Value Date   CHOL 144 03/02/2021   HDL 44 03/02/2021   LDLCALC 81 03/02/2021   TRIG 105 03/02/2021   CHOLHDL 3.3 03/02/2021   Lab Results  Component Value Date   TSH 1.190 03/02/2021   Lab Results  Component Value Date   HGBA1C 6.8 (H) 11/30/2021   Lab Results  Component Value Date   WBC 5.8 03/02/2021   HGB 13.6 03/02/2021   HCT 41.0 03/02/2021   MCV 83 03/02/2021   PLT 193 03/02/2021   Lab Results  Component Value Date   ALT 12 03/02/2021   AST 19 03/02/2021   ALKPHOS 97 03/02/2021   BILITOT 0.3 03/02/2021    Lab Results  Component Value Date   VD25OH 42.3 04/30/2020     Review of Systems  Constitutional:  Negative for chills, fatigue, fever and unexpected weight change.  HENT:  Positive for hearing loss. Negative for trouble swallowing.   Eyes:  Positive for itching (intermittent). Negative for visual disturbance.  Respiratory:  Negative for chest tightness and shortness of breath.   Cardiovascular:  Negative for chest pain and leg swelling.  Genitourinary:  Negative for dysuria and urgency.  Neurological:  Negative for dizziness, weakness, light-headedness and headaches.  Psychiatric/Behavioral:  Positive for confusion and decreased concentration. Negative for dysphoric mood and sleep disturbance. The patient is not nervous/anxious.     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   Essential hypertension 10/31/2014   Hyperlipidemia associated with type 2 diabetes mellitus ([place]) 10/31/2014   Hx of gastric ulcer 10/31/2014   Has a tremor 10/31/2014   Avitaminosis D 10/31/2014   Type II diabetes mellitus with complication ([place]) 58/83/2549    Allergies  Allergen Reactions   Penicillins Swelling    Past Surgical History:  Procedure Laterality Date   ESOPHAGOGASTRODUODENOSCOPY  07/2014   grade D esophagitis; prepyloric ulcer   EYE SURGERY Bilateral    cataracts   TONSILLECTOMY  UMBILICAL HERNIA REPAIR      Social History   Tobacco Use   Smoking status: Never   Smokeless tobacco: Never   Tobacco comments:    smoking cessation materials not required  Vaping Use   Vaping Use: Never used  Substance Use Topics   Alcohol use: Yes    Alcohol/week: 0.0 standard drinks of alcohol    Comment: occasionally once a year   Drug use: No     Medication list has been reviewed and updated.  Current Meds  Medication Sig    ACCU-CHEK SOFTCLIX LANCETS lancets    Acetylcysteine (NAC 600 PO) Take by mouth daily.   Apoaequorin (PREVAGEN PO) Take by mouth daily.   aspirin 81 MG tablet Take 81 mg by mouth every evening.   Blood Glucose Monitoring Suppl (ACCU-CHEK AVIVA PLUS) W/DEVICE KIT    cholecalciferol (VITAMIN D) 1000 UNITS tablet Take 1,000 Units by mouth daily.   glucose blood test strip    lisinopril (ZESTRIL) 5 MG tablet TAKE 1 TABLET(5 MG) BY MOUTH DAILY   lovastatin (MEVACOR) 40 MG tablet TAKE 1 TABLET(40 MG) BY MOUTH EVERY EVENING   magnesium 30 MG tablet Take 400 mg by mouth daily.   Multiple Vitamin (MULTIVITAMIN WITH MINERALS) TABS tablet Take 1 tablet by mouth daily.   pantoprazole (PROTONIX) 40 MG tablet Take 1 tablet (40 mg total) by mouth daily.   vitamin C (ASCORBIC ACID) 500 MG tablet Take by mouth.   [DISCONTINUED] metFORMIN (GLUCOPHAGE-XR) 750 MG 24 hr tablet Take 1 tablet (750 mg total) by mouth daily with breakfast.       03/14/2022    2:49 PM 07/23/2021    3:00 PM 05/05/2021    2:57 PM 03/02/2021    2:45 PM  GAD 7 : Generalized Anxiety Score  Nervous, Anxious, on Edge 0 0 0 0  Control/stop worrying 0 0 0 0  Worry too much - different things 0 0 0 0  Trouble relaxing 0 0 0 0  Restless 0 0 0 0  Easily annoyed or irritable 0 0 0 0  Afraid - awful might happen 0 0 0 0  Total GAD 7 Score 0 0 0 0  Anxiety Difficulty Not difficult at all  Not difficult at all        03/14/2022    2:49 PM 07/23/2021    2:59 PM 05/05/2021    2:57 PM  Depression screen PHQ 2/9  Decreased Interest 0 0 0  Down, Depressed, Hopeless 0 0 0  PHQ - 2 Score 0 0 0  Altered sleeping 0 0 0  Tired, decreased energy 0 0 0  Change in appetite 0 0 0  Feeling bad or failure about yourself  0 0 0  Trouble concentrating 0 0 0  Moving slowly or fidgety/restless 0 0 0  Suicidal thoughts 0 0 0  PHQ-9 Score 0 0 0  Difficult doing work/chores Not difficult at all Not difficult at all Not difficult at all      03/14/2022     2:44 PM 04/15/2019    9:33 AM 03/28/2018   11:12 AM 03/23/2017   10:02 AM  6CIT Screen  What Year? 4 points 0 points 0 points 0 points  What month? 0 points 0 points 0 points 0 points  What time? 0 points 0 points 0 points 0 points  Count back from 20 0 points 0 points 0 points 0 points  Months in reverse 4 points 0 points 0 points 0  points  Repeat phrase 10 points 8 points 8 points 4 points  Total Score 18 points 8 points 8 points 4 points      BP Readings from Last 3 Encounters:  03/14/22 138/62  11/30/21 128/80  07/23/21 (!) 144/86    Physical Exam Vitals and nursing note reviewed.  Constitutional:      General: She is not in acute distress.    Appearance: She is well-developed and well-groomed.  HENT:     Head: Normocephalic and atraumatic.     Right Ear: Tympanic membrane and ear canal normal. Decreased hearing noted.     Left Ear: Tympanic membrane and ear canal normal. Decreased hearing noted.  Eyes:     Extraocular Movements: Extraocular movements intact.     Conjunctiva/sclera: Conjunctivae normal.     Comments: Mild yellow crusting lower lids  Cardiovascular:     Rate and Rhythm: Normal rate and regular rhythm.     Pulses: Normal pulses.     Heart sounds: Normal heart sounds.  Pulmonary:     Effort: Pulmonary effort is normal. No respiratory distress.     Breath sounds: Normal breath sounds and air entry. No wheezing or rhonchi.  Musculoskeletal:     Cervical back: Normal range of motion.     Right lower leg: No edema.     Left lower leg: No edema.  Skin:    General: Skin is warm and dry.     Findings: No rash.  Neurological:     Mental Status: She is alert and oriented to person, place, and time.  Psychiatric:        Mood and Affect: Mood normal.        Behavior: Behavior normal. Behavior is cooperative.     Wt Readings from Last 3 Encounters:  03/14/22 163 lb (73.9 kg)  11/30/21 166 lb 3.2 oz (75.4 kg)  07/23/21 169 lb (76.7 kg)    BP  138/62 (BP Location: Right Arm, Cuff Size: Normal)   Pulse 87   Ht _0  (1.626 m)   Wt 163 lb (73.9 kg)   SpO2 94%   BMI 27.98 kg/m   Assessment and Plan: 1. Essential hypertension Clinically stable exam with well controlled BP. Tolerating medications without side effects at this time. Pt to continue current regimen and low sodium diet; benefits of regular exercise as able discussed.  2. MCI (mild cognitive impairment) Appears to be coping well.  She has her granddaughter with her at home and her daughter next door. She is appropriately no longer driving.  3. Type II diabetes mellitus with complication ([place]) Clinically stable by exam and report without s/s of hypoglycemia. DM complicated by hypertension and dyslipidemia. Tolerating medications well without side effects or other concerns.  Metformin was reduced to daily. - Basic metabolic panel - Hemoglobin A1c - metFORMIN (GLUCOPHAGE-XR) 750 MG 24 hr tablet; Take 1 tablet (750 mg total) by mouth daily with breakfast.  Dispense: 90 tablet; Refill: 1  4. Irritation of eye Continue moisturizing drops and warm compresses as needed  5. Need for immunization against influenza - Flu Vaccine QUAD High Dose(Fluad)   Partially dictated using Editor, commissioning. Any errors are unintentional.  Halina Maidens, MD [facility]  03/14/2022

## 2022-03-15 ENCOUNTER — Other Ambulatory Visit: Payer: Self-pay | Admitting: Internal Medicine

## 2022-03-15 DIAGNOSIS — E118 Type 2 diabetes mellitus with unspecified complications: Secondary | ICD-10-CM

## 2022-03-15 LAB — BASIC METABOLIC PANEL
BUN/Creatinine Ratio: 20 (ref 12–28)
BUN: 17 mg/dL (ref 10–36)
CO2: 25 mmol/L (ref 20–29)
Calcium: 9.2 mg/dL (ref 8.7–10.3)
Chloride: 101 mmol/L (ref 96–106)
Creatinine, Ser: 0.84 mg/dL (ref 0.57–1.00)
Glucose: 156 mg/dL — ABNORMAL HIGH (ref 70–99)
Potassium: 4.8 mmol/L (ref 3.5–5.2)
Sodium: 141 mmol/L (ref 134–144)
eGFR: 64 mL/min/{1.73_m2} (ref >59)

## 2022-03-15 LAB — HEMOGLOBIN A1C
Est. average glucose Bld gHb Est-mCnc: 148 mg/dL
Hgb A1c MFr Bld: 6.8 % — ABNORMAL HIGH (ref 4.8–5.6)

## 2022-03-15 NOTE — Telephone Encounter (Signed)
Unable to refill per protocol, last refill by  Provider 03/14/22. Will refuse duplicate request.  Requested Prescriptions  Pending Prescriptions Disp Refills  . metFORMIN (GLUCOPHAGE-XR) 750 MG 24 hr tablet 90 tablet 1    Sig: Take 1 tablet (750 mg total) by mouth daily with breakfast.     Endocrinology:  Diabetes - Biguanides Failed - 03/15/2022  3:46 PM      Failed - B12 Level in normal range and within 720 days    No results found for: "VITAMINB12"       Failed - CBC within normal limits and completed in the last 12 months    WBC  Date Value Ref Range Status  03/02/2021 5.8 3.4 - 10.8 x10E3/uL Final   RBC  Date Value Ref Range Status  03/02/2021 4.93 3.77 - 5.28 x10E6/uL Final   Hemoglobin  Date Value Ref Range Status  03/02/2021 13.6 11.1 - 15.9 g/dL Final   Hematocrit  Date Value Ref Range Status  03/02/2021 41.0 34.0 - 46.6 % Final   MCHC  Date Value Ref Range Status  03/02/2021 33.2 31.5 - 35.7 g/dL Final   [facility]  Date Value Ref Range Status  03/02/2021 27.6 26.6 - 33.0 pg Final   MCV  Date Value Ref Range Status  03/02/2021 83 79 - 97 fL Final   No results found for: "PLTCOUNTKUC", "LABPLAT", "POCPLA" RDW  Date Value Ref Range Status  03/02/2021 13.2 11.7 - 15.4 % Final         Passed - Cr in normal range and within 360 days    Creatinine, Ser  Date Value Ref Range Status  03/14/2022 0.84 0.57 - 1.00 mg/dL Final         Passed - HBA1C is between 0 and 7.9 and within 180 days    Hgb A1c MFr Bld  Date Value Ref Range Status  03/14/2022 6.8 (H) 4.8 - 5.6 % Final    Comment:             Prediabetes: 5.7 - 6.4          Diabetes: >6.4          Glycemic control for adults with diabetes: <7.0          Passed - eGFR in normal range and within 360 days    GFR calc Af Amer  Date Value Ref Range Status  04/30/2020 65 >59 mL/min/1.73 Final    Comment:    **In accordance with recommendations from the NKF-ASN Task force,**   [facility] is in the process of  updating its eGFR calculation to the   2021 CKD-EPI creatinine equation that estimates kidney function   without a race variable.    GFR calc non Af Amer  Date Value Ref Range Status  04/30/2020 57 (L) >59 mL/min/1.73 Final   eGFR  Date Value Ref Range Status  03/14/2022 64 >59 mL/min/1.73 Final         Passed - Valid encounter within last 6 months    Recent Outpatient Visits          Yesterday Essential hypertension   [place] [facility] at [facility], Jesse Sans, MD   3 months ago Essential hypertension   [place] [facility] at [facility], Jesse Sans, MD   7 months ago Essential hypertension   [place] [facility] at [facility], Jesse Sans, MD   10 months ago  Localized edema   [place] [facility] at [facility], Jesse Sans, MD   1 year ago Essential hypertension   [place] [facility] at [facility], Jesse Sans, MD

## 2022-03-15 NOTE — Telephone Encounter (Signed)
Medication Refill - Medication: metFORMIN (GLUCOPHAGE-XR) 750 MG 24 hr tablet  Has the patient contacted their pharmacy? No.  Preferred Pharmacy (with phone number or street name):  [id number] - [place], [place] [address] AT [place] Phone:  [phone]  Fax:  [phone]     Has the patient been seen for an appointment in the last year OR does the patient have an upcoming appointment? Yes.     Transmission to pharmacy failed (03/14/2022  3:07 PM EDT)

## 2022-03-16 ENCOUNTER — Other Ambulatory Visit: Payer: Self-pay | Admitting: Internal Medicine

## 2022-03-16 DIAGNOSIS — E118 Type 2 diabetes mellitus with unspecified complications: Secondary | ICD-10-CM

## 2022-03-16 MED ORDER — METFORMIN HCL ER 750 MG PO TB24: 750.0000 mg | ORAL_TABLET | Freq: Every day | ORAL | 1 refills | Status: DC

## 2022-03-16 NOTE — Telephone Encounter (Signed)
Copied from [place]. Topic: General - Other >> Mar 16, 2022  3:42 PM Everette C wrote: Reason for CRM: Medication Refill - Medication: metFORMIN (GLUCOPHAGE-XR) 750 MG 24 hr tablet [[id number]]   Has the patient contacted their pharmacy? Yes.   (Agent: If no, request that the patient contact the pharmacy for the refill. If patient does not wish to contact the pharmacy document the reason why and proceed with request.) (Agent: If yes, when and what did the pharmacy advise?)  Preferred Pharmacy (with phone number or street name): [facility], [place] [address] AT [place] [address] Phone: [phone] Fax: [phone] Hours: Not open 24 hours   Has the patient been seen for an appointment in the last year OR does the patient have an upcoming appointment? Yes.    Agent: Please be advised that RX refills may take up to 3 business days. We ask that you follow-up with your pharmacy.

## 2022-03-16 NOTE — Telephone Encounter (Signed)
Pt requesting refill on metformin. Reviewed chart while on hold with pharmacy. Previous on 03/14/22 E-Prescribing Status: Transmission to pharmacy failed (03/14/2022  3:07 PM EDT). Resent to pharmacy can confirmed it was received. No further assistance needed.

## 2022-03-21 DIAGNOSIS — M79675 Pain in left toe(s): Secondary | ICD-10-CM | POA: Diagnosis not present

## 2022-03-21 DIAGNOSIS — M79674 Pain in right toe(s): Secondary | ICD-10-CM | POA: Diagnosis not present

## 2022-03-21 DIAGNOSIS — B351 Tinea unguium: Secondary | ICD-10-CM | POA: Diagnosis not present

## 2022-03-28 ENCOUNTER — Other Ambulatory Visit: Payer: Self-pay | Admitting: Internal Medicine

## 2022-03-28 DIAGNOSIS — B9681 Helicobacter pylori [H. pylori] as the cause of diseases classified elsewhere: Secondary | ICD-10-CM

## 2022-03-29 NOTE — Telephone Encounter (Signed)
Requested Prescriptions  Pending Prescriptions Disp Refills  . pantoprazole (PROTONIX) 40 MG tablet [Pharmacy Med Name: PANTOPRAZOLE '40MG'$  TABLETS] 90 tablet 3    Sig: TAKE 1 TABLET(40 MG) BY MOUTH DAILY     Gastroenterology: Proton Pump Inhibitors Passed - 03/28/2022  3:40 PM      Passed - Valid encounter within last 12 months    Recent Outpatient Visits          2 weeks ago Essential hypertension   [place] [facility] at [facility], Jesse Sans, MD   3 months ago Essential hypertension   [place] [facility] at [facility], Jesse Sans, MD   8 months ago Essential hypertension   [place] [facility] at [facility], Jesse Sans, MD   10 months ago Localized edema   [place] [facility] at [facility], Jesse Sans, MD   1 year ago Essential hypertension   [place] [facility] at [facility], Jesse Sans, MD

## 2022-04-14 DIAGNOSIS — E119 Type 2 diabetes mellitus without complications: Secondary | ICD-10-CM | POA: Diagnosis not present

## 2022-04-14 DIAGNOSIS — I10 Essential (primary) hypertension: Secondary | ICD-10-CM | POA: Diagnosis not present

## 2022-04-14 DIAGNOSIS — R002 Palpitations: Secondary | ICD-10-CM | POA: Diagnosis not present

## 2022-04-14 DIAGNOSIS — E782 Mixed hyperlipidemia: Secondary | ICD-10-CM | POA: Diagnosis not present

## 2022-05-10 ENCOUNTER — Other Ambulatory Visit: Payer: Self-pay

## 2022-05-10 DIAGNOSIS — I10 Essential (primary) hypertension: Secondary | ICD-10-CM

## 2022-05-10 DIAGNOSIS — E1169 Type 2 diabetes mellitus with other specified complication: Secondary | ICD-10-CM

## 2022-05-10 MED ORDER — LISINOPRIL 5 MG PO TABS: ORAL_TABLET | ORAL | 3 refills | Status: DC

## 2022-05-10 MED ORDER — LOVASTATIN 40 MG PO TABS: ORAL_TABLET | ORAL | 3 refills | Status: DC

## 2022-06-21 ENCOUNTER — Telehealth: Payer: Self-pay | Admitting: Internal Medicine

## 2022-06-21 NOTE — Telephone Encounter (Signed)
Copied from [place] [id number]. Topic: Medicare AWV >> Jun 21, 2022  3:43 PM Devoria Glassing wrote: Reason for CRM: Attempted to schedule AWV. Unable to LVM.  Will try at later time.

## 2022-07-04 ENCOUNTER — Telehealth: Payer: Self-pay | Admitting: Internal Medicine

## 2022-07-04 NOTE — Telephone Encounter (Signed)
Spoke at length with Penny Andrews regarding her mothers dementia.  Penny Andrews remain very active but has poor memory and is not well supervised at home despite a niece living with her. She needs to go to respite care for several weeks at some time in the near future so that repairs can be done to her home.  She would come here for an OV and I can complete an FL-2 and order TB test. In the meantime, Penny Andrews will continue to provide what support she can.  She will call if she has any other immediate needs.

## 2022-07-20 ENCOUNTER — Ambulatory Visit (INDEPENDENT_AMBULATORY_CARE_PROVIDER_SITE_OTHER): Payer: Medicare Other | Admitting: Internal Medicine

## 2022-07-20 ENCOUNTER — Encounter: Payer: Self-pay | Admitting: Internal Medicine

## 2022-07-20 VITALS — BP 146/62 | HR 90 | Ht 64.0 in | Wt 162.0 lb

## 2022-07-20 DIAGNOSIS — H6122 Impacted cerumen, left ear: Secondary | ICD-10-CM | POA: Diagnosis not present

## 2022-07-20 NOTE — Progress Notes (Signed)
Date:  07/20/2022   Name:  Penny Andrews   DOB:  [birth year]   MRN:  [id number]   Chief Complaint: Ear Fullness  HPI Cerumen - she was told by the hearing aid employee that she had wax in her ears.  She feels fine, wears her aids and hears fairly well.  She is here for an ear check and to have the wax removed if possible.  Lab Results  Component Value Date   NA 141 03/14/2022   K 4.8 03/14/2022   CO2 25 03/14/2022   GLUCOSE 156 (H) 03/14/2022   BUN 17 03/14/2022   CREATININE 0.84 03/14/2022   CALCIUM 9.2 03/14/2022   EGFR 64 03/14/2022   GFRNONAA 57 (L) 04/30/2020   Lab Results  Component Value Date   CHOL 144 03/02/2021   HDL 44 03/02/2021   LDLCALC 81 03/02/2021   TRIG 105 03/02/2021   CHOLHDL 3.3 03/02/2021   Lab Results  Component Value Date   TSH 1.190 03/02/2021   Lab Results  Component Value Date   HGBA1C 6.8 (H) 03/14/2022   Lab Results  Component Value Date   WBC 5.8 03/02/2021   HGB 13.6 03/02/2021   HCT 41.0 03/02/2021   MCV 83 03/02/2021   PLT 193 03/02/2021   Lab Results  Component Value Date   ALT 12 03/02/2021   AST 19 03/02/2021   ALKPHOS 97 03/02/2021   BILITOT 0.3 03/02/2021   Lab Results  Component Value Date   VD25OH 42.3 04/30/2020     Review of Systems  Constitutional:  Negative for chills and fatigue.  HENT:  Positive for hearing loss. Negative for ear discharge.   Neurological:  Negative for dizziness and headaches.  Psychiatric/Behavioral:  Positive for decreased concentration.     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   Essential hypertension 10/31/2014   Hyperlipidemia associated with type 2 diabetes mellitus ([place]) 10/31/2014   Hx of gastric ulcer 10/31/2014   Has a tremor 10/31/2014   Avitaminosis D 10/31/2014   Type II diabetes mellitus with complication  ([place]) 46/50/3546    Allergies  Allergen Reactions   Penicillins Swelling    Past Surgical History:  Procedure Laterality Date   ESOPHAGOGASTRODUODENOSCOPY  07/2014   grade D esophagitis; prepyloric ulcer   EYE SURGERY Bilateral    cataracts   TONSILLECTOMY     UMBILICAL HERNIA REPAIR      Social History   Tobacco Use   Smoking status: Never   Smokeless tobacco: Never   Tobacco comments:    smoking cessation materials not required  Vaping Use   Vaping Use: Never used  Substance Use Topics   Alcohol use: Yes    Alcohol/week: 0.0 standard drinks of alcohol    Comment: occasionally once a year   Drug use: No     Medication list has been reviewed and updated.  Current Meds  Medication Sig   ACCU-CHEK SOFTCLIX LANCETS lancets    Acetylcysteine (NAC 600 PO) Take by mouth daily.   Apoaequorin (PREVAGEN PO) Take by mouth daily.   aspirin 81 MG tablet Take 81 mg by mouth every evening.   Blood Glucose Monitoring Suppl (ACCU-CHEK AVIVA PLUS) W/DEVICE KIT    cholecalciferol (VITAMIN D) 1000 UNITS tablet Take 1,000 Units by mouth daily.   glucose blood test strip  lisinopril (ZESTRIL) 5 MG tablet TAKE 1 TABLET(5 MG) BY MOUTH DAILY   lovastatin (MEVACOR) 40 MG tablet TAKE 1 TABLET(40 MG) BY MOUTH EVERY EVENING   magnesium 30 MG tablet Take 400 mg by mouth daily.   metFORMIN (GLUCOPHAGE-XR) 750 MG 24 hr tablet Take 1 tablet (750 mg total) by mouth daily with breakfast.   Multiple Vitamin (MULTIVITAMIN WITH MINERALS) TABS tablet Take 1 tablet by mouth daily.   pantoprazole (PROTONIX) 40 MG tablet Take 1 tablet (40 mg total) by mouth daily.   vitamin C (ASCORBIC ACID) 500 MG tablet Take by mouth.       07/20/2022    3:28 PM 03/14/2022    2:49 PM 07/23/2021    3:00 PM 05/05/2021    2:57 PM  GAD 7 : Generalized Anxiety Score  Nervous, Anxious, on Edge 0 0 0 0  Control/stop worrying 0 0 0 0  Worry too much - different things 0 0 0 0  Trouble relaxing 0 0 0 0  Restless 0 0 0  0  Easily annoyed or irritable 0 0 0 0  Afraid - awful might happen 0 0 0 0  Total GAD 7 Score 0 0 0 0  Anxiety Difficulty Not difficult at all Not difficult at all  Not difficult at all       07/20/2022    3:28 PM 03/14/2022    2:49 PM 07/23/2021    2:59 PM  Depression screen PHQ 2/9  Decreased Interest 0 0 0  Down, Depressed, Hopeless 0 0 0  PHQ - 2 Score 0 0 0  Altered sleeping 0 0 0  Tired, decreased energy 0 0 0  Change in appetite 0 0 0  Feeling bad or failure about yourself  0 0 0  Trouble concentrating 0 0 0  Moving slowly or fidgety/restless 0 0 0  Suicidal thoughts 0 0 0  PHQ-9 Score 0 0 0  Difficult doing work/chores Not difficult at all Not difficult at all Not difficult at all    BP Readings from Last 3 Encounters:  07/20/22 (!) 146/62  03/14/22 138/62  11/30/21 128/80    Physical Exam Vitals and nursing note reviewed.  Constitutional:      General: She is not in acute distress.    Appearance: Normal appearance. She is well-developed.  HENT:     Head: Normocephalic and atraumatic.     Right Ear: There is no impacted cerumen.     Ears:     Comments: Excessive cerumen adherent to the sides of the left ear canal but not obstructing Cardiovascular:     Rate and Rhythm: Normal rate and regular rhythm.  Pulmonary:     Effort: Pulmonary effort is normal. No respiratory distress.     Breath sounds: No wheezing or rhonchi.  Skin:    General: Skin is warm and dry.     Findings: No rash.  Neurological:     Mental Status: She is alert and oriented to person, place, and time.  Psychiatric:        Mood and Affect: Mood normal.        Behavior: Behavior normal.     Wt Readings from Last 3 Encounters:  07/20/22 162 lb (73.5 kg)  03/14/22 163 lb (73.9 kg)  11/30/21 166 lb 3.2 oz (75.4 kg)    BP (!) 146/62 (BP Location: Left Arm, Cuff Size: Normal)   Pulse 90   Ht '5\' 4"'$  (1.626 m)   Wt 162 lb (  73.5 kg)   SpO2 97%   BMI 27.81 kg/m   Assessment and  Plan: Problem List Items Addressed This Visit   None Visit Diagnoses     Excessive cerumen in ear canal, left    -  Primary   after flushing the ear canal is clean        Partially dictated using Editor, commissioning. Any errors are unintentional.  Halina Maidens, MD [facility]  07/20/2022

## 2022-08-15 DIAGNOSIS — B351 Tinea unguium: Secondary | ICD-10-CM | POA: Diagnosis not present

## 2022-08-15 DIAGNOSIS — M79675 Pain in left toe(s): Secondary | ICD-10-CM | POA: Diagnosis not present

## 2022-08-15 DIAGNOSIS — M79674 Pain in right toe(s): Secondary | ICD-10-CM | POA: Diagnosis not present

## 2022-08-23 ENCOUNTER — Telehealth: Payer: Self-pay | Admitting: Internal Medicine

## 2022-08-23 NOTE — Telephone Encounter (Signed)
Copied from [place] [id number]. Topic: General - Inquiry >> Aug 23, 2022  3:56 PM Rosanne Ashing P wrote: Reason for CRM: pt's daughter would like a nurse to call her back about her mothers glucose readings. CB@  [phone]

## 2022-08-24 ENCOUNTER — Telehealth: Payer: Self-pay

## 2022-08-24 NOTE — Telephone Encounter (Signed)
Patients daughter wants to know what to do if her bs is over 200? Can she take a glipizide? And what the magic number to go to the ER?  Thank you.

## 2022-08-24 NOTE — Telephone Encounter (Signed)
Called and left detailed msg informing pts daughter of this information.  Penny Andrews

## 2022-08-24 NOTE — Telephone Encounter (Signed)
Patients daughter wants to know what to do if her bs is over 200? Can she take a glipizide? And what the magic number to go to the ER?   Thank you.

## 2022-08-25 NOTE — Telephone Encounter (Signed)
Daughter informed.  Kamile Fassler

## 2022-09-09 ENCOUNTER — Other Ambulatory Visit: Payer: Self-pay | Admitting: Internal Medicine

## 2022-09-09 DIAGNOSIS — E118 Type 2 diabetes mellitus with unspecified complications: Secondary | ICD-10-CM

## 2022-09-09 NOTE — Telephone Encounter (Signed)
Requested medications are due for refill today.  yes  Requested medications are on the active medications list.  yes  Last refill. 03/16/2022 #90 1 rf  Future visit scheduled.   yes  Notes to clinic.  Labs are expired.    Requested Prescriptions  Pending Prescriptions Disp Refills   metFORMIN (GLUCOPHAGE-XR) 750 MG 24 hr tablet [Pharmacy Med Name: METFORMIN ER 750MG  24HR TABS] 90 tablet 1    Sig: TAKE 1 TABLET(750 MG) BY MOUTH DAILY WITH BREAKFAST     Endocrinology:  Diabetes - Biguanides Failed - 09/09/2022  6:20 AM      Failed - B12 Level in normal range and within 720 days    No results found for: "VITAMINB12"       Failed - CBC within normal limits and completed in the last 12 months    WBC  Date Value Ref Range Status  03/02/2021 5.8 3.4 - 10.8 x10E3/uL Final   RBC  Date Value Ref Range Status  03/02/2021 4.93 3.77 - 5.28 x10E6/uL Final   Hemoglobin  Date Value Ref Range Status  03/02/2021 13.6 11.1 - 15.9 g/dL Final   Hematocrit  Date Value Ref Range Status  03/02/2021 41.0 34.0 - 46.6 % Final   MCHC  Date Value Ref Range Status  03/02/2021 33.2 31.5 - 35.7 g/dL Final   [facility]  Date Value Ref Range Status  03/02/2021 27.6 26.6 - 33.0 pg Final   MCV  Date Value Ref Range Status  03/02/2021 83 79 - 97 fL Final   No results found for: "PLTCOUNTKUC", "LABPLAT", "POCPLA" RDW  Date Value Ref Range Status  03/02/2021 13.2 11.7 - 15.4 % Final         Passed - Cr in normal range and within 360 days    Creatinine, Ser  Date Value Ref Range Status  03/14/2022 0.84 0.57 - 1.00 mg/dL Final         Passed - HBA1C is between 0 and 7.9 and within 180 days    Hgb A1c MFr Bld  Date Value Ref Range Status  03/14/2022 6.8 (H) 4.8 - 5.6 % Final    Comment:             Prediabetes: 5.7 - 6.4          Diabetes: >6.4          Glycemic control for adults with diabetes: <7.0          Passed - eGFR in normal range and within 360 days    GFR calc Af Amer  Date Value  Ref Range Status  04/30/2020 65 >59 mL/min/1.73 Final    Comment:    **In accordance with recommendations from the NKF-ASN Task force,**   [facility] is in the process of updating its eGFR calculation to the   2021 CKD-EPI creatinine equation that estimates kidney function   without a race variable.    GFR calc non Af Amer  Date Value Ref Range Status  04/30/2020 57 (L) >59 mL/min/1.73 Final   eGFR  Date Value Ref Range Status  03/14/2022 64 >59 mL/min/1.73 Final         Passed - Valid encounter within last 6 months    Recent Outpatient Visits           1 month ago Excessive cerumen in ear canal, left   [place] [facility], Jesse Sans, MD   5 months ago Essential hypertension  [facility] at [facility], Jesse Sans, MD   9 months ago Essential hypertension   [place] [facility] at [facility], Jesse Sans, MD   1 year ago Essential hypertension   [place] [facility] at [facility], Jesse Sans, MD   1 year ago Localized edema   [facility] at [facility], Jesse Sans, MD       Future Appointments             In 2 weeks [place] Melia Jesse Sans, MD [place] at [facility], [facility]

## 2022-09-27 ENCOUNTER — Ambulatory Visit (INDEPENDENT_AMBULATORY_CARE_PROVIDER_SITE_OTHER): Payer: Medicare Other

## 2022-09-27 ENCOUNTER — Ambulatory Visit (INDEPENDENT_AMBULATORY_CARE_PROVIDER_SITE_OTHER): Payer: Medicare Other | Admitting: Internal Medicine

## 2022-09-27 ENCOUNTER — Encounter: Payer: Self-pay | Admitting: Internal Medicine

## 2022-09-27 VITALS — BP 132/70 | HR 79 | Ht 64.0 in | Wt 155.0 lb

## 2022-09-27 DIAGNOSIS — E1169 Type 2 diabetes mellitus with other specified complication: Secondary | ICD-10-CM

## 2022-09-27 DIAGNOSIS — Z0000 Encounter for general adult medical examination without abnormal findings: Secondary | ICD-10-CM

## 2022-09-27 DIAGNOSIS — I10 Essential (primary) hypertension: Secondary | ICD-10-CM | POA: Diagnosis not present

## 2022-09-27 DIAGNOSIS — E118 Type 2 diabetes mellitus with unspecified complications: Secondary | ICD-10-CM

## 2022-09-27 DIAGNOSIS — E785 Hyperlipidemia, unspecified: Secondary | ICD-10-CM | POA: Diagnosis not present

## 2022-09-27 NOTE — Assessment & Plan Note (Signed)
Clinically stable exam with well controlled BP on lisinopril. Tolerating medications without side effects. Pt to continue current regimen and low sodium diet.  

## 2022-09-27 NOTE — Progress Notes (Signed)
Date:  09/27/2022   Name:  Penny StampsHelen K Andrews   DOB:  1925/04/28   MRN:  [id number]   Chief Complaint: Hyperglycemia (Pt having flushing in face. BS when her face is flushing is over 200. Trying to change diet- changed bread type. Pt eating before BS is taken even when she is told not to. )  Diabetes She presents for her follow-up diabetic visit. She has type 2 diabetes mellitus. Her disease course has been stable. Pertinent negatives for hypoglycemia include no headaches or tremors. Pertinent negatives for diabetes include no chest pain, no fatigue, no polydipsia and no polyuria. Pertinent negatives for diabetic complications include no CVA, heart disease, nephropathy or PVD. Current diabetic treatment includes oral agent (monotherapy). She is compliant with treatment all of the time. Weight trend: decreased 7 lbs. An ACE inhibitor/angiotensin II receptor blocker is being taken.  Hypertension This is a chronic problem. The problem is controlled. Pertinent negatives include no chest pain, headaches, palpitations or shortness of breath. Past treatments include ACE inhibitors. There is no history of CVA or PVD.  Hyperlipidemia This is a chronic problem. The problem is controlled. Pertinent negatives include no chest pain or shortness of breath. Current antihyperlipidemic treatment includes statins. The current treatment provides significant improvement of lipids.    Lab Results  Component Value Date   NA 141 03/14/2022   K 4.8 03/14/2022   CO2 25 03/14/2022   GLUCOSE 156 (H) 03/14/2022   BUN 17 03/14/2022   CREATININE 0.84 03/14/2022   CALCIUM 9.2 03/14/2022   EGFR 64 03/14/2022   GFRNONAA 57 (L) 04/30/2020   Lab Results  Component Value Date   CHOL 144 03/02/2021   HDL 44 03/02/2021   LDLCALC 81 03/02/2021   TRIG 105 03/02/2021   CHOLHDL 3.3 03/02/2021   Lab Results  Component Value Date   TSH 1.190 03/02/2021   Lab Results  Component Value Date   HGBA1C 6.8 (H) 03/14/2022    Lab Results  Component Value Date   WBC 5.8 03/02/2021   HGB 13.6 03/02/2021   HCT 41.0 03/02/2021   MCV 83 03/02/2021   PLT 193 03/02/2021   Lab Results  Component Value Date   ALT 12 03/02/2021   AST 19 03/02/2021   ALKPHOS 97 03/02/2021   BILITOT 0.3 03/02/2021   Lab Results  Component Value Date   VD25OH 42.3 04/30/2020     Review of Systems  Constitutional:  Negative for appetite change, fatigue, fever and unexpected weight change.  HENT:  Negative for tinnitus and trouble swallowing.   Eyes:  Negative for visual disturbance.  Respiratory:  Negative for cough, chest tightness and shortness of breath.   Cardiovascular:  Negative for chest pain, palpitations and leg swelling.  Gastrointestinal:  Negative for abdominal pain.  Endocrine: Negative for polydipsia and polyuria.  Genitourinary:  Negative for dysuria and hematuria.  Musculoskeletal:  Negative for arthralgias.  Neurological:  Negative for tremors, numbness and headaches.  Psychiatric/Behavioral:  Negative for dysphoric mood.     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   Essential hypertension 10/31/2014   Hyperlipidemia associated with type 2 diabetes mellitus (HCC) 10/31/2014   Hx of gastric ulcer 10/31/2014   Has a tremor 10/31/2014   Avitaminosis D 10/31/2014   Type II diabetes mellitus with complication 12/27/2013    Allergies  Allergen Reactions   Penicillins Swelling    Past Surgical History:  Procedure Laterality Date   ESOPHAGOGASTRODUODENOSCOPY  07/2014   grade D esophagitis; prepyloric ulcer   EYE SURGERY Bilateral    cataracts   TONSILLECTOMY     UMBILICAL HERNIA REPAIR      Social History   Tobacco Use   Smoking status: Never   Smokeless tobacco: Never   Tobacco comments:    smoking cessation materials not required  Vaping  Use   Vaping Use: Never used  Substance Use Topics   Alcohol use: Yes    Alcohol/week: 0.0 standard drinks of alcohol    Comment: occasionally once a year   Drug use: No     Medication list has been reviewed and updated.  Current Meds  Medication Sig   ACCU-CHEK SOFTCLIX LANCETS lancets    Acetylcysteine (NAC 600 PO) Take by mouth daily.   Apoaequorin (PREVAGEN PO) Take by mouth daily.   aspirin 81 MG tablet Take 81 mg by mouth every evening.   Blood Glucose Monitoring Suppl (ACCU-CHEK AVIVA PLUS) W/DEVICE KIT    cholecalciferol (VITAMIN D) 1000 UNITS tablet Take 1,000 Units by mouth daily.   glucose blood test strip    lisinopril (ZESTRIL) 5 MG tablet TAKE 1 TABLET(5 MG) BY MOUTH DAILY   lovastatin (MEVACOR) 40 MG tablet TAKE 1 TABLET(40 MG) BY MOUTH EVERY EVENING   magnesium 30 MG tablet Take 400 mg by mouth daily.   metFORMIN (GLUCOPHAGE-XR) 750 MG 24 hr tablet TAKE 1 TABLET(750 MG) BY MOUTH DAILY WITH BREAKFAST   Multiple Vitamin (MULTIVITAMIN WITH MINERALS) TABS tablet Take 1 tablet by mouth daily.   pantoprazole (PROTONIX) 40 MG tablet Take 1 tablet (40 mg total) by mouth daily.   vitamin C (ASCORBIC ACID) 500 MG tablet Take by mouth.       07/20/2022    3:28 PM 03/14/2022    2:49 PM 07/23/2021    3:00 PM 05/05/2021    2:57 PM  GAD 7 : Generalized Anxiety Score  Nervous, Anxious, on Edge 0 0 0 0  Control/stop worrying 0 0 0 0  Worry too much - different things 0 0 0 0  Trouble relaxing 0 0 0 0  Restless 0 0 0 0  Easily annoyed or irritable 0 0 0 0  Afraid - awful might happen 0 0 0 0  Total GAD 7 Score 0 0 0 0  Anxiety Difficulty Not difficult at all Not difficult at all  Not difficult at all       07/20/2022    3:28 PM 03/14/2022    2:49 PM 07/23/2021    2:59 PM  Depression screen PHQ 2/9  Decreased Interest 0 0 0  Down, Depressed, Hopeless 0 0 0  PHQ - 2 Score 0 0 0  Altered sleeping 0 0 0  Tired, decreased energy 0 0 0  Change in appetite 0 0 0  Feeling  bad or failure about yourself  0 0 0  Trouble concentrating 0 0 0  Moving slowly or fidgety/restless 0 0 0  Suicidal thoughts 0 0 0  PHQ-9 Score 0 0 0  Difficult doing work/chores Not difficult at all Not difficult at all Not difficult at all      09/27/2022    4:38 PM 03/14/2022    2:44 PM 04/15/2019    9:33 AM 03/28/2018   11:12 AM 03/23/2017   10:02 AM  6CIT Screen  What Year? 0 points 4 points  0 points 0 points 0 points  What month? 0 points 0 points 0 points 0 points 0 points  What time? 0 points 0 points 0 points 0 points 0 points  Count back from 20 0 points 0 points 0 points 0 points 0 points  Months in reverse 0 points 4 points 0 points 0 points 0 points  Repeat phrase 10 points 10 points 8 points 8 points 4 points  Total Score 10 points 18 points 8 points 8 points 4 points     BP Readings from Last 3 Encounters:  09/27/22 132/70  07/20/22 (!) 146/62  03/14/22 138/62    Physical Exam Vitals and nursing note reviewed.  Constitutional:      General: She is not in acute distress.    Appearance: Normal appearance. She is well-developed.  HENT:     Head: Normocephalic and atraumatic.  Neck:     Vascular: No carotid bruit.  Cardiovascular:     Rate and Rhythm: Normal rate and regular rhythm.     Heart sounds: No murmur heard. Pulmonary:     Effort: Pulmonary effort is normal. No respiratory distress.     Breath sounds: No wheezing or rhonchi.  Musculoskeletal:     Cervical back: Normal range of motion.     Right lower leg: No edema.     Left lower leg: No edema.  Lymphadenopathy:     Cervical: No cervical adenopathy.  Skin:    General: Skin is warm and dry.     Capillary Refill: Capillary refill takes less than 2 seconds.     Findings: No rash.  Neurological:     General: No focal deficit present.     Mental Status: She is alert and oriented to person, place, and time.  Psychiatric:        Mood and Affect: Mood normal.        Behavior: Behavior normal.     Diabetic Foot Exam - Simple   Simple Foot Form Diabetic Foot exam was performed with the following findings: Yes 09/27/2022  4:15 PM  Visual Inspection No deformities, no ulcerations, no other skin breakdown bilaterally: Yes Sensation Testing Intact to touch and monofilament testing bilaterally: Yes Pulse Check Posterior Tibialis and Dorsalis pulse intact bilaterally: Yes Comments      Wt Readings from Last 3 Encounters:  09/27/22 155 lb (70.3 kg)  07/20/22 162 lb (73.5 kg)  03/14/22 163 lb (73.9 kg)    BP 132/70   Pulse 79   Ht 5\' 4"  (1.626 m)   Wt 155 lb (70.3 kg)   SpO2 98%   BMI 26.61 kg/m   Assessment and Plan:  Problem List Items Addressed This Visit       Cardiovascular and Mediastinum   Essential hypertension (Chronic)    Clinically stable exam with well controlled BP on lisinopril. Tolerating medications without side effects. Pt to continue current regimen and low sodium diet.       Relevant Orders   CBC with Differential/Platelet   TSH     Endocrine   Hyperlipidemia associated with type 2 diabetes mellitus (HCC) (Chronic)   Relevant Orders   Lipid panel   Type II diabetes mellitus with complication - Primary (Chronic)    Clinically stable without s/s of hypoglycemia. Tolerating metformin well without side effects or other concerns. Lab Results  Component Value Date   HGBA1C 6.8 (H) 03/14/2022        Relevant Orders   Comprehensive metabolic panel  Hemoglobin A1c    Return in about 4 months (around 01/27/2023) for DM, HTN.   MAW completed by CMA.  Partially dictated using Dragon software, any errors are not intentional.  Reubin Milan, MD [facility] [place], [place]

## 2022-09-27 NOTE — Assessment & Plan Note (Signed)
Clinically stable without s/s of hypoglycemia. Tolerating metformin well without side effects or other concerns. Lab Results  Component Value Date   HGBA1C 6.8 (H) 03/14/2022

## 2022-09-27 NOTE — Progress Notes (Signed)
Subjective:   Penny StampsHelen K Andrews is a 89 y.o. female who presents for Medicare Annual (Subsequent) preventive examination.  I connected with  Penny Andrews on 09/27/22 by an in person visit and verified that I am speaking with the correct person using two identifiers.  Patient Location: Other:  [facility]  Provider Location: Office/Clinic  I discussed the limitations of evaluation and management by telemedicine. The patient expressed understanding and agreed to proceed.   Review of Systems    Defer to PCP Cardiac Risk Factors include: advanced age (>5655men, 33>65 women);diabetes mellitus     Objective:    Today's Vitals   09/27/22 1631  PainSc: 0-No pain   There is no height or weight on file to calculate BMI.     09/27/2022    4:32 PM 04/15/2019    9:30 AM 03/28/2018   11:11 AM 11/04/2014    2:04 PM  Advanced Directives  Does Patient Have a Medical Advance Directive? No Yes Yes Yes  Type of Special educational needs teacherAdvance Directive  Healthcare Power of PreaknessAttorney;Living will Healthcare Power of [place]orney;Living will Living will  Copy of Healthcare Power of Attorney in Chart?  No - copy requested No - copy requested No - copy requested  Would patient like information on creating a medical advance directive? No - Patient declined       Current Medications (verified) Outpatient Encounter Medications as of 09/27/2022  Medication Sig   ACCU-CHEK SOFTCLIX LANCETS lancets    Acetylcysteine (NAC 600 PO) Take by mouth daily.   Apoaequorin (PREVAGEN PO) Take by mouth daily.   aspirin 81 MG tablet Take 81 mg by mouth every evening.   Blood Glucose Monitoring Suppl (ACCU-CHEK AVIVA PLUS) W/DEVICE KIT    cholecalciferol (VITAMIN D) 1000 UNITS tablet Take 1,000 Units by mouth daily.   glucose blood test strip    lisinopril (ZESTRIL) 5 MG tablet TAKE 1 TABLET(5 MG) BY MOUTH DAILY   lovastatin (MEVACOR) 40 MG tablet TAKE 1 TABLET(40 MG) BY MOUTH EVERY EVENING   magnesium 30 MG tablet Take 400 mg by mouth daily.    metFORMIN (GLUCOPHAGE-XR) 750 MG 24 hr tablet TAKE 1 TABLET(750 MG) BY MOUTH DAILY WITH BREAKFAST   Multiple Vitamin (MULTIVITAMIN WITH MINERALS) TABS tablet Take 1 tablet by mouth daily.   pantoprazole (PROTONIX) 40 MG tablet Take 1 tablet (40 mg total) by mouth daily.   vitamin C (ASCORBIC ACID) 500 MG tablet Take by mouth.   No facility-administered encounter medications on file as of 09/27/2022.    Allergies (verified) Penicillins   History: Past Medical History:  Diagnosis Date   Diabetes mellitus without complication    Patient reported   GERD (gastroesophageal reflux disease)    Hypertension    Past Surgical History:  Procedure Laterality Date   ESOPHAGOGASTRODUODENOSCOPY  07/2014   grade D esophagitis; prepyloric ulcer   EYE SURGERY Bilateral    cataracts   TONSILLECTOMY     UMBILICAL HERNIA REPAIR     Family History  Problem Relation Age of Onset   CAD Father    Cancer Mother    Social History   Socioeconomic History   Marital status: Widowed    Spouse name: Not on file   Number of children: 4   Years of education: Not on file   Highest education level: 12th grade  Occupational History   Occupation: Retired  Tobacco Use   Smoking status: Never   Smokeless tobacco: Never   Tobacco comments:    smoking cessation  materials not required  Vaping Use   Vaping Use: Never used  Substance and Sexual Activity   Alcohol use: Yes    Alcohol/week: 0.0 standard drinks of alcohol    Comment: occasionally once a year   Drug use: No   Sexual activity: Not Currently  Other Topics Concern   Not on file  Social History Narrative   Not on file   Social Determinants of Health   Financial Resource Strain: Low Risk  (03/28/2018)   Overall Financial Resource Strain (CARDIA)    Difficulty of Paying Living Expenses: Not hard at all  Food Insecurity: No Food Insecurity (03/28/2018)   Hunger Vital Sign    Worried About Running Out of Food in the Last Year: Never true     Ran Out of Food in the Last Year: Never true  Transportation Needs: No Transportation Needs (03/28/2018)   PRAPARE - Administrator, Civil Service (Medical): No    Lack of Transportation (Non-Medical): No  Physical Activity: Inactive (03/28/2018)   Exercise Vital Sign    Days of Exercise per Week: 0 days    Minutes of Exercise per Session: 0 min  Stress: No Stress Concern Present (03/28/2018)   [employer] of Occupational Health - Occupational Stress Questionnaire    Feeling of Stress : Not at all  Social Connections: Unknown (03/28/2018)   Social Connection and Isolation Panel [NHANES]    Frequency of Communication with Friends and Family: Patient declined    Frequency of Social Gatherings with Friends and Family: Patient declined    Attends Religious Services: Patient declined    Database administrator or Organizations: Patient declined    Attends Engineer, structural: Patient declined    Marital Status: Married    Tobacco Counseling Counseling given: Not Answered Tobacco comments: smoking cessation materials not required   Clinical Intake:  Pre-visit preparation completed: Yes  Pain : No/denies pain Pain Score: 0-No pain     BMI - recorded: 26.61 Nutritional Status: BMI 25 -29 Overweight Nutritional Risks: None Diabetes: Yes  How often do you need to have someone help you when you read instructions, pamphlets, or other written materials from your doctor or pharmacy?: 1 - Never  Diabetic? Yes  Interpreter Needed?: No  Information entered by :: Margaretha Sheffield, CMA   Activities of Daily Living    09/27/2022    4:36 PM  In your present state of health, do you have any difficulty performing the following activities:  Hearing? 0  Vision? 0  Difficulty concentrating or making decisions? 1  Walking or climbing stairs? 1  Dressing or bathing? 0  Doing errands, shopping? 0  Preparing Food and eating ? N  Using the Toilet? N  In the past six  months, have you accidently leaked urine? N  Do you have problems with loss of bowel control? N  Managing your Medications? Y  Managing your Finances? Y  Housekeeping or managing your Housekeeping? Y    Patient Care Team: Reubin Milan, MD as PCP - General (Internal Medicine) Christena Deem, MD (Inactive) as Consulting Physician (Gastroenterology) Lady Gary Darlin Priestly, MD as Consulting Physician (Cardiology) [facility] (Ophthalmology) Jesusita Oka, MD as Referring Physician (Dermatology)  Indicate any recent Medical Services you may have received from other than [facility] providers in the past year (date may be approximate).     Assessment:   This is a routine wellness examination for Penny Andrews.  Hearing/Vision screen Hearing Screening - Comments::  No concerns. Vision Screening - Comments:: No concerns.  Dietary issues and exercise activities discussed: Current Exercise Habits: The patient does not participate in regular exercise at present, Exercise limited by: None identified   Goals Addressed   None   Depression Screen    07/20/2022    3:28 PM 03/14/2022    2:49 PM 07/23/2021    2:59 PM 05/05/2021    2:57 PM 03/02/2021    2:44 PM 10/28/2020    2:51 PM 04/30/2020    8:58 AM  PHQ 2/9 Scores  PHQ - 2 Score 0 0 0 0 0 3 0  PHQ- 9 Score 0 0 0 0 0 6 0    Fall Risk    07/20/2022    3:28 PM 03/14/2022    2:49 PM 07/23/2021    3:00 PM 05/05/2021    2:58 PM 03/02/2021    2:43 PM  Fall Risk   Falls in the past year? 0 0 1 0 1  Number falls in past yr: 0 0 1 0 1  Injury with Fall? 0 0 1 0 1  Comment   bruise    Risk for fall due to : No Fall Risks No Fall Risks History of fall(s) No Fall Risks History of fall(s)  Follow up Falls evaluation completed Falls evaluation completed Falls evaluation completed Falls evaluation completed Falls evaluation completed    FALL RISK PREVENTION PERTAINING TO THE HOME:  Any stairs in or around the home? Yes  If so, are there any  without handrails? No  Home free of loose throw rugs in walkways, pet beds, electrical cords, etc? Yes  Adequate lighting in your home to reduce risk of falls? Yes   ASSISTIVE DEVICES UTILIZED TO PREVENT FALLS:  Life alert? No  Use of a cane, walker or w/c? No   TIMED UP AND GO:  Was the test performed? Yes .  Gait slow and steady without use of assistive device  Cognitive Function:        09/27/2022    4:38 PM 03/14/2022    2:44 PM 04/15/2019    9:33 AM 03/28/2018   11:12 AM 03/23/2017   10:02 AM  6CIT Screen  What Year? 0 points 4 points 0 points 0 points 0 points  What month? 0 points 0 points 0 points 0 points 0 points  What time? 0 points 0 points 0 points 0 points 0 points  Count back from 20 0 points 0 points 0 points 0 points 0 points  Months in reverse 0 points 4 points 0 points 0 points 0 points  Repeat phrase 10 points 10 points 8 points 8 points 4 points  Total Score 10 points 18 points 8 points 8 points 4 points    Immunizations Immunization History  Administered Date(s) Administered   Fluad Quad(high Dose 65+) 03/21/2019, 04/30/2020, 03/02/2021, 03/14/2022   Influenza, High Dose Seasonal PF 03/23/2017, 03/28/2018   Influenza-Unspecified 03/14/2016   Moderna Sars-Covid-2 Vaccination 10/28/2019, 11/28/2020   Pneumococcal Conjugate-13 07/29/2013, 03/22/2016   Pneumococcal Polysaccharide-23 03/07/2006   Pneumococcal-Unspecified 06/20/2012   Tdap 06/29/2011   Zoster, Live 12/03/2006    TDAP status: Due, Education has been provided regarding the importance of this vaccine. Advised may receive this vaccine at local pharmacy or Health Dept. Aware to provide a copy of the vaccination record if obtained from local pharmacy or Health Dept. Verbalized acceptance and understanding.  Flu Vaccine status: Up to date  Pneumococcal vaccine status: Up to date  Covid-19 vaccine  status: Completed vaccines  Qualifies for Shingles Vaccine? Yes   Zostavax completed No    Shingrix Completed?: No.    Education has been provided regarding the importance of this vaccine. Patient has been advised to call insurance company to determine out of pocket expense if they have not yet received this vaccine. Advised may also receive vaccine at local pharmacy or Health Dept. Verbalized acceptance and understanding.  Screening Tests Health Maintenance  Topic Date Due   Zoster Vaccines- Shingrix (1 of 2) Never done   COVID-19 Vaccine (3 - Moderna risk series) 12/26/2020   DTaP/Tdap/Td (2 - Td or Tdap) 06/28/2021   OPHTHALMOLOGY EXAM  09/02/2022   HEMOGLOBIN A1C  09/12/2022   INFLUENZA VACCINE  01/19/2023   FOOT EXAM  09/27/2023   Medicare Annual Wellness (AWV)  09/27/2023   Pneumonia Vaccine 44+ Years old  Completed   HPV VACCINES  Aged Out    Health Maintenance  Health Maintenance Due  Topic Date Due   Zoster Vaccines- Shingrix (1 of 2) Never done   COVID-19 Vaccine (3 - Moderna risk series) 12/26/2020   DTaP/Tdap/Td (2 - Td or Tdap) 06/28/2021   OPHTHALMOLOGY EXAM  09/02/2022   HEMOGLOBIN A1C  09/12/2022    Colorectal cancer screening: No longer required.   Mammogram status: No longer required due to age.  Lung Cancer Screening: (Low Dose CT Chest recommended if Age 28-80 years, 30 pack-year currently smoking OR have quit w/in 15years.) does not qualify.   Additional Screening:  Vision Screening: Recommended annual ophthalmology exams for early detection of glaucoma and other disorders of the eye. Is the patient up to date with their annual eye exam?  Yes  Who is the provider or what is the name of the office in which the patient attends annual eye exams? [facility]  Dental Screening: Recommended annual dental exams for proper oral hygiene  Community Resource Referral / Chronic Care Management: CRR required this visit?  No   CCM required this visit?  No      Plan:     I have personally reviewed and noted the following in the patient's  chart:   Medical and social history Use of alcohol, tobacco or illicit drugs  Current medications and supplements including opioid prescriptions. Patient is not currently taking opioid prescriptions. Functional ability and status Nutritional status Physical activity Advanced directives List of other physicians Hospitalizations, surgeries, and ER visits in previous 12 months Vitals Screenings to include cognitive, depression, and falls Referrals and appointments  In addition, I have reviewed and discussed with patient certain preventive protocols, quality metrics, and best practice recommendations. A written personalized care plan for preventive services as well as general preventive health recommendations were provided to patient.     Mariel Sleet, CMA   09/27/2022   Nurse Notes: None

## 2022-09-28 LAB — COMPREHENSIVE METABOLIC PANEL
ALT: 15 IU/L (ref 0–32)
AST: 23 IU/L (ref 0–40)
Albumin/Globulin Ratio: 1.8 (ref 1.2–2.2)
Albumin: 4.4 g/dL (ref 3.6–4.6)
Alkaline Phosphatase: 101 IU/L (ref 44–121)
BUN/Creatinine Ratio: 29 — ABNORMAL HIGH (ref 12–28)
BUN: 22 mg/dL (ref 10–36)
Bilirubin Total: 0.4 mg/dL (ref 0.0–1.2)
CO2: 23 mmol/L (ref 20–29)
Calcium: 10.3 mg/dL (ref 8.7–10.3)
Chloride: 102 mmol/L (ref 96–106)
Creatinine, Ser: 0.77 mg/dL (ref 0.57–1.00)
Globulin, Total: 2.5 g/dL (ref 1.5–4.5)
Glucose: 86 mg/dL (ref 70–99)
Potassium: 4.6 mmol/L (ref 3.5–5.2)
Sodium: 144 mmol/L (ref 134–144)
Total Protein: 6.9 g/dL (ref 6.0–8.5)
eGFR: 71 mL/min/{1.73_m2} (ref >59)

## 2022-09-28 LAB — TSH: TSH: 1.4 u[IU]/mL (ref 0.450–4.500)

## 2022-09-28 LAB — CBC WITH DIFFERENTIAL/PLATELET
Basophils Absolute: 0.1 10*3/uL (ref 0.0–0.2)
Basos: 1 %
EOS (ABSOLUTE): 0.4 10*3/uL (ref 0.0–0.4)
Eos: 5 %
Hematocrit: 45.6 % (ref 34.0–46.6)
Hemoglobin: 14.6 g/dL (ref 11.1–15.9)
Immature Grans (Abs): 0 10*3/uL (ref 0.0–0.1)
Immature Granulocytes: 0 %
Lymphocytes Absolute: 2.3 10*3/uL (ref 0.7–3.1)
Lymphs: 33 %
MCH: 28.2 pg (ref 26.6–33.0)
MCHC: 32 g/dL (ref 31.5–35.7)
MCV: 88 fL (ref 79–97)
Monocytes Absolute: 0.6 10*3/uL (ref 0.1–0.9)
Monocytes: 8 %
Neutrophils Absolute: 3.8 10*3/uL (ref 1.4–7.0)
Neutrophils: 53 %
Platelets: 179 10*3/uL (ref 150–450)
RBC: 5.17 x10E6/uL (ref 3.77–5.28)
RDW: 13 % (ref 11.7–15.4)
WBC: 7.1 10*3/uL (ref 3.4–10.8)

## 2022-09-28 LAB — HEMOGLOBIN A1C
Est. average glucose Bld gHb Est-mCnc: 140 mg/dL
Hgb A1c MFr Bld: 6.5 % — ABNORMAL HIGH (ref 4.8–5.6)

## 2022-09-28 LAB — LIPID PANEL
Chol/HDL Ratio: 2.8 ratio (ref 0.0–4.4)
Cholesterol, Total: 151 mg/dL (ref 100–199)
HDL: 53 mg/dL (ref >39)
LDL Chol Calc (NIH): 81 mg/dL (ref 0–99)
Triglycerides: 88 mg/dL (ref 0–149)
VLDL Cholesterol Cal: 17 mg/dL (ref 5–40)

## 2023-01-16 ENCOUNTER — Ambulatory Visit: Payer: Medicare Other | Admitting: Podiatry

## 2023-01-23 ENCOUNTER — Encounter: Payer: Self-pay | Admitting: Podiatry

## 2023-01-23 ENCOUNTER — Ambulatory Visit (INDEPENDENT_AMBULATORY_CARE_PROVIDER_SITE_OTHER): Payer: Medicare Other | Admitting: Podiatry

## 2023-01-23 DIAGNOSIS — E118 Type 2 diabetes mellitus with unspecified complications: Secondary | ICD-10-CM

## 2023-01-23 DIAGNOSIS — M79675 Pain in left toe(s): Secondary | ICD-10-CM | POA: Diagnosis not present

## 2023-01-23 DIAGNOSIS — M79674 Pain in right toe(s): Secondary | ICD-10-CM

## 2023-01-23 DIAGNOSIS — B351 Tinea unguium: Secondary | ICD-10-CM

## 2023-01-23 DIAGNOSIS — I8393 Asymptomatic varicose veins of bilateral lower extremities: Secondary | ICD-10-CM | POA: Diagnosis not present

## 2023-02-01 NOTE — Progress Notes (Signed)
Subjective: Penny Andrews presents today for diabetic foot evaluation.  Patient denies any h/o foot wounds.  Patient denies any numbness, tingling, burning, or pins/needle sensation in feet.  Risk factors: diabetes, HTN, hyperlipidemia.  PCP is Reubin Milan, MD , and last visit was September 27, 2022.  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   Essential hypertension 10/31/2014   Hyperlipidemia associated with type 2 diabetes mellitus (HCC) 10/31/2014   Hx of gastric ulcer 10/31/2014   Has a tremor 10/31/2014   Avitaminosis D 10/31/2014   Type II diabetes mellitus with complication (HCC) 12/27/2013    Past Surgical History:  Procedure Laterality Date   ESOPHAGOGASTRODUODENOSCOPY  07/2014   grade D esophagitis; prepyloric ulcer   EYE SURGERY Bilateral    cataracts   TONSILLECTOMY     UMBILICAL HERNIA REPAIR      Current Outpatient Medications on File Prior to Visit  Medication Sig Dispense Refill   ACCU-CHEK SOFTCLIX LANCETS lancets      Acetylcysteine (NAC 600 PO) Take by mouth daily.     Apoaequorin (PREVAGEN PO) Take by mouth daily.     aspirin 81 MG tablet Take 81 mg by mouth every evening.     Blood Glucose Monitoring Suppl (ACCU-CHEK AVIVA PLUS) W/DEVICE KIT   0   cholecalciferol (VITAMIN D) 1000 UNITS tablet Take 1,000 Units by mouth daily.     glucose blood test strip      lisinopril (ZESTRIL) 5 MG tablet TAKE 1 TABLET(5 MG) BY MOUTH DAILY 90 tablet 3   lovastatin (MEVACOR) 40 MG tablet TAKE 1 TABLET(40 MG) BY MOUTH EVERY EVENING 90 tablet 3   magnesium 30 MG tablet Take 400 mg by mouth daily.     metFORMIN (GLUCOPHAGE-XR) 750 MG 24 hr tablet  TAKE 1 TABLET(750 MG) BY MOUTH DAILY WITH BREAKFAST 90 tablet 1   Multiple Vitamin (MULTIVITAMIN WITH MINERALS) TABS tablet Take 1 tablet by mouth daily.     pantoprazole (PROTONIX) 40 MG tablet Take 1 tablet (40 mg total) by mouth daily. 90 tablet 3   vitamin C (ASCORBIC ACID) 500 MG tablet Take by mouth.     No current facility-administered medications on file prior to visit.     Allergies  Allergen Reactions   Penicillins Swelling    Social History   Occupational History   Occupation: Retired  Tobacco Use   Smoking status: Never   Smokeless tobacco: Never   Tobacco comments:    smoking cessation materials not required  Vaping Use   Vaping status: Never Used  Substance and Sexual Activity   Alcohol use: Yes    Alcohol/week: 0.0 standard drinks of alcohol    Comment: occasionally once a year   Drug use: No   Sexual activity: Not Currently    Family History  Problem Relation Age of Onset   CAD Father    Cancer Mother     Immunization History  Administered Date(s) Administered   Fluad Quad(high Dose 65+) 03/21/2019, 04/30/2020, 03/02/2021, 03/14/2022   Influenza, High Dose Seasonal PF 03/23/2017, 03/28/2018   Influenza-Unspecified 03/14/2016   Moderna Sars-Covid-2 Vaccination 10/28/2019, 11/28/2020   Pneumococcal Conjugate-13 07/29/2013, 03/22/2016  Pneumococcal Polysaccharide-23 03/07/2006   Pneumococcal-Unspecified 06/20/2012   Tdap 06/29/2011   Zoster, Live 12/03/2006    Objective: There were no vitals filed for this visit.  Penny Andrews is a pleasant 89 y.o. female WD, WN in NAD. AAO X 3.  Vascular Examination: Capillary refill time immediate b/l. Vascular status intact b/l with palpable DP pulses; faintly palpable PT pulses. Pedal hair absent b/l. No edema. No pain with calf compression b/l. Skin temperature gradient WNL b/l. No cyanosis or clubbing noted b/l. No ischemia or gangrene b/l. Varicosities present b/l.  Neurological Examination: Sensation  grossly intact b/l with 10 gram monofilament. Vibratory sensation intact b/l.   Dermatological Examination: Pedal skin with normal turgor, texture and tone b/l.  No open wounds. No interdigital macerations.   Toenails 1-5 b/l thick, discolored, elongated with subungual debris and pain on dorsal palpation.   No hyperkeratotic nor porokeratotic lesions present on today's visit.  Musculoskeletal Examination: Muscle strength 5/5 to all lower extremity muscle groups bilaterally. No pain, crepitus or joint limitation noted with ROM bilateral LE. No gross bony deformities bilaterally. Patient ambulates independent of any assistive aids.  Radiographs: None  Last A1c:      Latest Ref Rng & Units 09/27/2022    4:29 PM 03/14/2022    4:18 PM  Hemoglobin A1C  Hemoglobin-A1c 4.8 - 5.6 % 6.5  6.8     Assessment: 1. Pain due to onychomycosis of toenails of both feet   2. 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 Type II diabetes mellitus with complication (HCC)     ADA Risk Categorization: Low Risk:  Patient has all of the following: Intact protective sensation No prior foot ulcer  No severe deformity Pedal pulses present  Plan: -Patient was evaluated and treated. All patient's and/or POA's questions/concerns answered on today's visit. -Diabetic foot examination performed today. -Continue diabetic foot care principles: inspect feet daily, monitor glucose as recommended by PCP and/or Endocrinologist, and follow prescribed diet per PCP, Endocrinologist and/or dietician. -Patient to continue soft, supportive shoe gear daily. -Toenails 1-5 b/l were debrided in length and girth with sterile nail nippers and dremel without iatrogenic bleeding.  -Patient/POA to call should there be question/concern in the interim.  Return in about 3 months (around 04/25/2023).  Freddie Breech, DPM

## 2023-03-08 ENCOUNTER — Other Ambulatory Visit: Payer: Self-pay | Admitting: Internal Medicine

## 2023-03-08 DIAGNOSIS — E118 Type 2 diabetes mellitus with unspecified complications: Secondary | ICD-10-CM

## 2023-03-08 DIAGNOSIS — B9681 Helicobacter pylori [H. pylori] as the cause of diseases classified elsewhere: Secondary | ICD-10-CM

## 2023-03-15 ENCOUNTER — Ambulatory Visit (INDEPENDENT_AMBULATORY_CARE_PROVIDER_SITE_OTHER): Payer: Medicare Other | Admitting: Internal Medicine

## 2023-03-15 ENCOUNTER — Encounter: Payer: Self-pay | Admitting: Internal Medicine

## 2023-03-15 VITALS — BP 134/62 | HR 92 | Ht 64.0 in | Wt 153.0 lb

## 2023-03-15 DIAGNOSIS — E1169 Type 2 diabetes mellitus with other specified complication: Secondary | ICD-10-CM

## 2023-03-15 DIAGNOSIS — E118 Type 2 diabetes mellitus with unspecified complications: Secondary | ICD-10-CM | POA: Diagnosis not present

## 2023-03-15 DIAGNOSIS — Z23 Encounter for immunization: Secondary | ICD-10-CM

## 2023-03-15 DIAGNOSIS — I10 Essential (primary) hypertension: Secondary | ICD-10-CM

## 2023-03-15 DIAGNOSIS — Z7984 Long term (current) use of oral hypoglycemic drugs: Secondary | ICD-10-CM | POA: Diagnosis not present

## 2023-03-15 DIAGNOSIS — E785 Hyperlipidemia, unspecified: Secondary | ICD-10-CM

## 2023-03-15 MED ORDER — LISINOPRIL 5 MG PO TABS: ORAL_TABLET | ORAL | 1 refills | Status: DC

## 2023-03-15 MED ORDER — LOVASTATIN 40 MG PO TABS
ORAL_TABLET | ORAL | 1 refills | Status: DC
Start: 2023-03-15 — End: 2023-08-08

## 2023-03-15 NOTE — Assessment & Plan Note (Addendum)
Blood sugars stable without hypoglycemic symptoms or events.  FSBS  90-100 Current regimen is metformin. Changes made last visit are none. Lab Results  Component Value Date   HGBA1C 6.5 (H) 09/27/2022  Eye exam is due. A1C today = 5.8

## 2023-03-15 NOTE — Progress Notes (Signed)
Date:  03/15/2023   Name:  Penny Andrews   DOB:  Dec 18, 1924   MRN:  [id number]   Chief Complaint: Hypertension and Diabetes  Hypertension This is a chronic problem. The problem is controlled. Pertinent negatives include no chest pain, headaches, palpitations or shortness of breath. Past treatments include ACE inhibitors. The current treatment provides significant improvement.  Diabetes She presents for her follow-up diabetic visit. She has type 2 diabetes mellitus. Her disease course has been stable. Pertinent negatives for hypoglycemia include no headaches or tremors. Pertinent negatives for diabetes include no chest pain, no fatigue, no polydipsia and no polyuria. Current diabetic treatment includes oral agent (dual therapy). An ACE inhibitor/angiotensin II receptor blocker is being taken.    Lab Results  Component Value Date   NA 144 09/27/2022   K 4.6 09/27/2022   CO2 23 09/27/2022   GLUCOSE 86 09/27/2022   BUN 22 09/27/2022   CREATININE 0.77 09/27/2022   CALCIUM 10.3 09/27/2022   EGFR 71 09/27/2022   GFRNONAA 57 (L) 04/30/2020   Lab Results  Component Value Date   CHOL 151 09/27/2022   HDL 53 09/27/2022   LDLCALC 81 09/27/2022   TRIG 88 09/27/2022   CHOLHDL 2.8 09/27/2022   Lab Results  Component Value Date   TSH 1.400 09/27/2022   Lab Results  Component Value Date   HGBA1C 6.5 (H) 09/27/2022   Lab Results  Component Value Date   WBC 7.1 09/27/2022   HGB 14.6 09/27/2022   HCT 45.6 09/27/2022   MCV 88 09/27/2022   PLT 179 09/27/2022   Lab Results  Component Value Date   ALT 15 09/27/2022   AST 23 09/27/2022   ALKPHOS 101 09/27/2022   BILITOT 0.4 09/27/2022   Lab Results  Component Value Date   VD25OH 42.3 04/30/2020     Review of Systems  Constitutional:  Negative for appetite change, fatigue, fever and unexpected weight change.  HENT:  Negative for tinnitus and trouble swallowing.   Eyes:  Negative for visual disturbance.  Respiratory:   Negative for cough, chest tightness and shortness of breath.   Cardiovascular:  Negative for chest pain, palpitations and leg swelling.  Gastrointestinal:  Negative for abdominal pain.  Endocrine: Negative for polydipsia and polyuria.  Genitourinary:  Negative for dysuria and hematuria.  Musculoskeletal:  Negative for arthralgias.  Neurological:  Negative for tremors, numbness and headaches.  Psychiatric/Behavioral:  Negative for dysphoric mood.     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   Essential hypertension 10/31/2014   Hyperlipidemia associated with type 2 diabetes mellitus (HCC) 10/31/2014   Hx of gastric ulcer 10/31/2014   Has a tremor 10/31/2014   Avitaminosis D 10/31/2014   Type II diabetes mellitus with complication (HCC) 12/27/2013    Allergies  Allergen Reactions   Penicillins Swelling    Past Surgical History:  Procedure Laterality Date   ESOPHAGOGASTRODUODENOSCOPY  07/2014   grade D esophagitis; prepyloric ulcer   EYE SURGERY Bilateral    cataracts   TONSILLECTOMY     UMBILICAL HERNIA REPAIR      Social History   Tobacco Use   Smoking status: Never   Smokeless tobacco: Never   Tobacco comments:    smoking cessation materials not required  Vaping Use   Vaping status: Never Used  Substance Use Topics  Alcohol use: Yes    Alcohol/week: 0.0 standard drinks of alcohol    Comment: occasionally once a year   Drug use: No     Medication list has been reviewed and updated.  Current Meds  Medication Sig   ACCU-CHEK SOFTCLIX LANCETS lancets    Acetylcysteine (NAC 600 PO) Take by mouth daily.   Apoaequorin (PREVAGEN PO) Take by mouth daily.   Blood Glucose Monitoring Suppl (ACCU-CHEK AVIVA PLUS) W/DEVICE KIT    cholecalciferol (VITAMIN D) 1000 UNITS tablet Take 1,000 Units by mouth daily.   glucose  blood test strip    magnesium 30 MG tablet Take 400 mg by mouth daily.   metFORMIN (GLUCOPHAGE-XR) 750 MG 24 hr tablet TAKE 1 TABLET(750 MG) BY MOUTH DAILY WITH BREAKFAST   Multiple Vitamin (MULTIVITAMIN WITH MINERALS) TABS tablet Take 1 tablet by mouth daily.   pantoprazole (PROTONIX) 40 MG tablet TAKE 1 TABLET(40 MG) BY MOUTH DAILY   vitamin C (ASCORBIC ACID) 500 MG tablet Take by mouth.   Zinc 10 MG LOZG Use as directed in the mouth or throat.   [DISCONTINUED] lisinopril (ZESTRIL) 5 MG tablet TAKE 1 TABLET(5 MG) BY MOUTH DAILY   [DISCONTINUED] lovastatin (MEVACOR) 40 MG tablet TAKE 1 TABLET(40 MG) BY MOUTH EVERY EVENING       07/20/2022    3:28 PM 03/14/2022    2:49 PM 07/23/2021    3:00 PM 05/05/2021    2:57 PM  GAD 7 : Generalized Anxiety Score  Nervous, Anxious, on Edge 0 0 0 0  Control/stop worrying 0 0 0 0  Worry too much - different things 0 0 0 0  Trouble relaxing 0 0 0 0  Restless 0 0 0 0  Easily annoyed or irritable 0 0 0 0  Afraid - awful might happen 0 0 0 0  Total GAD 7 Score 0 0 0 0  Anxiety Difficulty Not difficult at all Not difficult at all  Not difficult at all       07/20/2022    3:28 PM 03/14/2022    2:49 PM 07/23/2021    2:59 PM  Depression screen PHQ 2/9  Decreased Interest 0 0 0  Down, Depressed, Hopeless 0 0 0  PHQ - 2 Score 0 0 0  Altered sleeping 0 0 0  Tired, decreased energy 0 0 0  Change in appetite 0 0 0  Feeling bad or failure about yourself  0 0 0  Trouble concentrating 0 0 0  Moving slowly or fidgety/restless 0 0 0  Suicidal thoughts 0 0 0  PHQ-9 Score 0 0 0  Difficult doing work/chores Not difficult at all Not difficult at all Not difficult at all    BP Readings from Last 3 Encounters:  03/15/23 134/62  09/27/22 132/70  07/20/22 (!) 146/62    Physical Exam Vitals and nursing note reviewed.  Constitutional:      General: She is not in acute distress.    Appearance: She is well-developed.  HENT:     Head: Normocephalic and  atraumatic.  Neck:     Vascular: No carotid bruit.  Cardiovascular:     Rate and Rhythm: Normal rate and regular rhythm.     Heart sounds: No murmur heard. Pulmonary:     Effort: Pulmonary effort is normal. No respiratory distress.     Breath sounds: No wheezing or rhonchi.  Abdominal:     Palpations: Abdomen is soft.     Tenderness: There is no abdominal tenderness.  Musculoskeletal:     Cervical back: Normal range of motion.     Right lower leg: No edema.     Left lower leg: No edema.  Lymphadenopathy:     Cervical: No cervical adenopathy.  Skin:    General: Skin is warm and dry.     Findings: No rash.  Neurological:     General: No focal deficit present.     Mental Status: She is alert and oriented to person, place, and time.  Psychiatric:        Mood and Affect: Mood normal.        Behavior: Behavior normal.     Wt Readings from Last 3 Encounters:  03/15/23 153 lb (69.4 kg)  09/27/22 155 lb (70.3 kg)  07/20/22 162 lb (73.5 kg)    BP 134/62   Pulse 92   Ht 5\' 4"  (1.626 m)   Wt 153 lb (69.4 kg)   SpO2 96%   BMI 26.26 kg/m   Assessment and Plan:  Problem List Items Addressed This Visit       Unprioritized   Type II diabetes mellitus with complication (HCC) (Chronic)    Blood sugars stable without hypoglycemic symptoms or events.  FSBS  90-100 Current regimen is metformin. Changes made last visit are none. Lab Results  Component Value Date   HGBA1C 6.5 (H) 09/27/2022  Eye exam is due. A1C today = 5.8       Relevant Medications   lisinopril (ZESTRIL) 5 MG tablet   lovastatin (MEVACOR) 40 MG tablet   Hyperlipidemia associated with type 2 diabetes mellitus (HCC) (Chronic)   Relevant Medications   lisinopril (ZESTRIL) 5 MG tablet   lovastatin (MEVACOR) 40 MG tablet   Essential hypertension - Primary (Chronic)    Normal exam with stable BP on lisinopril. No concerns or side effects to current medication. No change in regimen; continue low sodium  diet.       Relevant Medications   lisinopril (ZESTRIL) 5 MG tablet   lovastatin (MEVACOR) 40 MG tablet    Return in about 6 months (around 09/12/2023) for Medicare annual.    Reubin Milan, MD [facility] [place]

## 2023-03-15 NOTE — Addendum Note (Signed)
Addended by: Jerald Kief on: 03/15/2023 04:01 PM   Modules accepted: Orders

## 2023-03-15 NOTE — Assessment & Plan Note (Signed)
Normal exam with stable BP on lisinopril. No concerns or side effects to current medication. No change in regimen; continue low sodium diet.

## 2023-05-10 DIAGNOSIS — E119 Type 2 diabetes mellitus without complications: Secondary | ICD-10-CM | POA: Diagnosis not present

## 2023-05-10 DIAGNOSIS — E782 Mixed hyperlipidemia: Secondary | ICD-10-CM | POA: Diagnosis not present

## 2023-05-10 DIAGNOSIS — R002 Palpitations: Secondary | ICD-10-CM | POA: Diagnosis not present

## 2023-05-10 DIAGNOSIS — K219 Gastro-esophageal reflux disease without esophagitis: Secondary | ICD-10-CM | POA: Diagnosis not present

## 2023-05-10 DIAGNOSIS — I10 Essential (primary) hypertension: Secondary | ICD-10-CM | POA: Diagnosis not present

## 2023-06-28 ENCOUNTER — Encounter: Payer: Self-pay | Admitting: Podiatry

## 2023-06-28 ENCOUNTER — Ambulatory Visit (INDEPENDENT_AMBULATORY_CARE_PROVIDER_SITE_OTHER): Payer: Medicare Other | Admitting: Podiatry

## 2023-06-28 DIAGNOSIS — B351 Tinea unguium: Secondary | ICD-10-CM

## 2023-06-28 DIAGNOSIS — M79674 Pain in right toe(s): Secondary | ICD-10-CM

## 2023-06-28 DIAGNOSIS — M79675 Pain in left toe(s): Secondary | ICD-10-CM

## 2023-06-28 NOTE — Progress Notes (Signed)
  Subjective:  Patient ID: Penny Andrews, female    DOB: 1925/01/18,  MRN: [id number]  Chief Complaint  Patient presents with   Nail Problem    She's here for a toenail clipping.    89 y.o. female presents with the above complaint. History confirmed with patient.  Nails are thick and elongated causing pain and discomfort, the last debridement was helpful  Objective:  Physical Exam: warm, good capillary refill, no trophic changes or ulcerative lesions, normal DP and PT pulses, and normal sensory exam. Left Foot: dystrophic yellowed discolored nail plates with subungual debris Right Foot: dystrophic yellowed discolored nail plates with subungual debris    Assessment:   1. Pain due to onychomycosis of toenails of both feet      Plan:  Patient was evaluated and treated and all questions answered.   Discussed the etiology and treatment options for the condition in detail with the patient.  . Recommended debridement of the nails today. Sharp and mechanical debridement performed of all painful and mycotic nails today. Nails debrided in length and thickness using a nail nipper to level of comfort. Discussed treatment options including appropriate shoe gear. Follow up as needed for painful nails.  Some iatrogenic bleeding of the fourth toe was noted on debridement applied antibiotic ointment and a Band-Aid.  Advised to leave on until tomorrow.  Notify me immediately if signs of infection develop    Return in about 3 months (around 09/26/2023) for at risk diabetic foot care.

## 2023-07-03 DIAGNOSIS — E119 Type 2 diabetes mellitus without complications: Secondary | ICD-10-CM | POA: Diagnosis not present

## 2023-07-03 DIAGNOSIS — Z961 Presence of intraocular lens: Secondary | ICD-10-CM | POA: Diagnosis not present

## 2023-07-03 DIAGNOSIS — H26493 Other secondary cataract, bilateral: Secondary | ICD-10-CM | POA: Diagnosis not present

## 2023-07-03 LAB — HM DIABETES EYE EXAM

## 2023-07-06 ENCOUNTER — Telehealth: Payer: Self-pay | Admitting: Internal Medicine

## 2023-07-06 NOTE — Telephone Encounter (Signed)
Pt needs to schedule an appt. Also we would need to know the location of the facility that she would be going to and what vaccines etc that is needed before she goes.  KP

## 2023-07-06 NOTE — Telephone Encounter (Signed)
Spoke to [name] she stated mother may need FL2 forms. Will discuss at appt in Feb.  KP

## 2023-07-06 NOTE — Telephone Encounter (Signed)
Copied from CRM [id number]. Topic: General - Inquiry >> Jul 06, 2023  2:24 PM [name] wrote: Daughter Penny Andrews would like a call back to discuss the possibility of an FL2 getting filled out for the pt and ho that process works.

## 2023-08-08 ENCOUNTER — Encounter: Payer: Self-pay | Admitting: Internal Medicine

## 2023-08-08 ENCOUNTER — Ambulatory Visit (INDEPENDENT_AMBULATORY_CARE_PROVIDER_SITE_OTHER): Payer: Medicare Other | Admitting: Internal Medicine

## 2023-08-08 VITALS — BP 128/74 | HR 93 | Ht 64.0 in | Wt 153.0 lb

## 2023-08-08 DIAGNOSIS — E785 Hyperlipidemia, unspecified: Secondary | ICD-10-CM

## 2023-08-08 DIAGNOSIS — K289 Gastrojejunal ulcer, unspecified as acute or chronic, without hemorrhage or perforation: Secondary | ICD-10-CM | POA: Diagnosis not present

## 2023-08-08 DIAGNOSIS — I10 Essential (primary) hypertension: Secondary | ICD-10-CM

## 2023-08-08 DIAGNOSIS — E1169 Type 2 diabetes mellitus with other specified complication: Secondary | ICD-10-CM

## 2023-08-08 DIAGNOSIS — Z7984 Long term (current) use of oral hypoglycemic drugs: Secondary | ICD-10-CM | POA: Diagnosis not present

## 2023-08-08 DIAGNOSIS — E118 Type 2 diabetes mellitus with unspecified complications: Secondary | ICD-10-CM

## 2023-08-08 DIAGNOSIS — B9681 Helicobacter pylori [H. pylori] as the cause of diseases classified elsewhere: Secondary | ICD-10-CM

## 2023-08-08 MED ORDER — PANTOPRAZOLE SODIUM 40 MG PO TBEC
40.0000 mg | DELAYED_RELEASE_TABLET | Freq: Every day | ORAL | 1 refills | Status: DC
Start: 2023-08-08 — End: 2024-05-10

## 2023-08-08 MED ORDER — LISINOPRIL 5 MG PO TABS
ORAL_TABLET | ORAL | 1 refills | Status: DC
Start: 2023-08-08 — End: 2023-11-30

## 2023-08-08 MED ORDER — LOVASTATIN 40 MG PO TABS
ORAL_TABLET | ORAL | 1 refills | Status: DC
Start: 2023-08-08 — End: 2024-05-10

## 2023-08-08 MED ORDER — METFORMIN HCL ER 750 MG PO TB24
750.0000 mg | ORAL_TABLET | Freq: Every day | ORAL | 1 refills | Status: DC
Start: 2023-08-08 — End: 2024-02-20

## 2023-08-08 NOTE — Assessment & Plan Note (Signed)
 Controlled BP with normal exam. Current regimen is lisinopril. Will continue same medications; encourage continued reduced sodium diet.

## 2023-08-08 NOTE — Assessment & Plan Note (Signed)
 Controlled. Lab Results  Component Value Date   LDLCALC 81 09/27/2022

## 2023-08-08 NOTE — Assessment & Plan Note (Signed)
 Blood sugars stable without hypoglycemic symptoms or events. Currently managed with metformin. Changes made last visit are none. Lab Results  Component Value Date   HGBA1C 6.5 (H) 09/27/2022

## 2023-08-08 NOTE — Progress Notes (Signed)
 Date:  08/08/2023   Name:  Penny Andrews   DOB:  1924/08/01   MRN:  [id number]   Chief Complaint: Edema (Purple and swollen foot. Wants to have evaluated.), Hypertension, and Diabetes  Hypertension This is a chronic problem. The problem is controlled. Pertinent negatives include no chest pain, headaches, palpitations or shortness of breath. Past treatments include ACE inhibitors. The current treatment provides significant improvement.  Diabetes She presents for her follow-up diabetic visit. She has type 2 diabetes mellitus. Her disease course has been stable. Pertinent negatives for hypoglycemia include no headaches, nervousness/anxiousness or tremors. Pertinent negatives for diabetes include no chest pain, no fatigue, no polydipsia and no polyuria.    Review of Systems  Constitutional:  Negative for appetite change, fatigue, fever and unexpected weight change.  Eyes:  Negative for visual disturbance.  Respiratory:  Negative for cough, chest tightness and shortness of breath.   Cardiovascular:  Negative for chest pain, palpitations and leg swelling.  Gastrointestinal:  Negative for abdominal pain, nausea and vomiting.  Endocrine: Negative for polydipsia and polyuria.  Genitourinary:  Negative for dysuria and hematuria.  Musculoskeletal:  Positive for arthralgias.  Neurological:  Negative for tremors, numbness and headaches.  Psychiatric/Behavioral:  Negative for dysphoric mood. The patient is not nervous/anxious.      Lab Results  Component Value Date   NA 144 09/27/2022   K 4.6 09/27/2022   CO2 23 09/27/2022   GLUCOSE 86 09/27/2022   BUN 22 09/27/2022   CREATININE 0.77 09/27/2022   CALCIUM 10.3 09/27/2022   EGFR 71 09/27/2022   GFRNONAA 57 (L) 04/30/2020   Lab Results  Component Value Date   CHOL 151 09/27/2022   HDL 53 09/27/2022   LDLCALC 81 09/27/2022   TRIG 88 09/27/2022   CHOLHDL 2.8 09/27/2022   Lab Results  Component Value Date   TSH 1.400 09/27/2022    Lab Results  Component Value Date   HGBA1C 6.5 (H) 09/27/2022   Lab Results  Component Value Date   WBC 7.1 09/27/2022   HGB 14.6 09/27/2022   HCT 45.6 09/27/2022   MCV 88 09/27/2022   PLT 179 09/27/2022   Lab Results  Component Value Date   ALT 15 09/27/2022   AST 23 09/27/2022   ALKPHOS 101 09/27/2022   BILITOT 0.4 09/27/2022   Lab Results  Component Value Date   VD25OH 42.3 04/30/2020     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   Essential hypertension 10/31/2014   Hyperlipidemia associated with type 2 diabetes mellitus (HCC) 10/31/2014   Hx of gastric ulcer 10/31/2014   Has a tremor 10/31/2014   Avitaminosis D 10/31/2014   Type II diabetes mellitus with complication (HCC) 12/27/2013    Allergies  Allergen Reactions   Penicillins Swelling    Past Surgical History:  Procedure Laterality Date   ESOPHAGOGASTRODUODENOSCOPY  07/2014   grade D esophagitis; prepyloric ulcer   EYE SURGERY Bilateral    cataracts   TONSILLECTOMY     UMBILICAL HERNIA REPAIR      Social History   Tobacco Use   Smoking status: Never   Smokeless tobacco: Never   Tobacco comments:    smoking cessation materials not required  Vaping Use   Vaping status: Never Used  Substance Use Topics   Alcohol use: Not Currently    Comment:  occasionally once a year   Drug use: No     Medication list has been reviewed and updated.  Current Meds  Medication Sig   ACCU-CHEK SOFTCLIX LANCETS lancets    Acetylcysteine (NAC 600 PO) Take by mouth daily.   Apoaequorin (PREVAGEN PO) Take by mouth daily.   aspirin 81 MG tablet Take 81 mg by mouth every evening.   Blood Glucose Monitoring Suppl (ACCU-CHEK AVIVA PLUS) W/DEVICE KIT    cholecalciferol (VITAMIN D) 1000 UNITS tablet Take 1,000 Units by mouth daily.   glucose blood test strip     magnesium 30 MG tablet Take 400 mg by mouth daily.   Multiple Vitamin (MULTIVITAMIN WITH MINERALS) TABS tablet Take 1 tablet by mouth daily.   vitamin C (ASCORBIC ACID) 500 MG tablet Take by mouth.   Zinc 10 MG LOZG Use as directed in the mouth or throat.   [DISCONTINUED] lisinopril (ZESTRIL) 5 MG tablet TAKE 1 TABLET(5 MG) BY MOUTH DAILY   [DISCONTINUED] lovastatin (MEVACOR) 40 MG tablet TAKE 1 TABLET(40 MG) BY MOUTH EVERY EVENING   [DISCONTINUED] metFORMIN (GLUCOPHAGE-XR) 750 MG 24 hr tablet TAKE 1 TABLET(750 MG) BY MOUTH DAILY WITH BREAKFAST   [DISCONTINUED] pantoprazole (PROTONIX) 40 MG tablet TAKE 1 TABLET(40 MG) BY MOUTH DAILY       08/08/2023    3:11 PM 07/20/2022    3:28 PM 03/14/2022    2:49 PM 07/23/2021    3:00 PM  GAD 7 : Generalized Anxiety Score  Nervous, Anxious, on Edge 0 0 0 0  Control/stop worrying 0 0 0 0  Worry too much - different things 0 0 0 0  Trouble relaxing 0 0 0 0  Restless 0 0 0 0  Easily annoyed or irritable 0 0 0 0  Afraid - awful might happen 0 0 0 0  Total GAD 7 Score 0 0 0 0  Anxiety Difficulty Not difficult at all Not difficult at all Not difficult at all        08/08/2023    3:11 PM 07/20/2022    3:28 PM 03/14/2022    2:49 PM  Depression screen PHQ 2/9  Decreased Interest 0 0 0  Down, Depressed, Hopeless 0 0 0  PHQ - 2 Score 0 0 0  Altered sleeping 0 0 0  Tired, decreased energy 3 0 0  Change in appetite 0 0 0  Feeling bad or failure about yourself  0 0 0  Trouble concentrating 0 0 0  Moving slowly or fidgety/restless 0 0 0  Suicidal thoughts 0 0 0  PHQ-9 Score 3 0 0  Difficult doing work/chores Not difficult at all Not difficult at all Not difficult at all    BP Readings from Last 3 Encounters:  08/08/23 128/74  03/15/23 134/62  09/27/22 132/70    Physical Exam Constitutional:      Appearance: Normal appearance.  Cardiovascular:     Rate and Rhythm: Normal rate and regular rhythm.     Heart sounds: No murmur heard. Pulmonary:      Effort: Pulmonary effort is normal.     Breath sounds: Normal breath sounds.  Abdominal:     General: Abdomen is flat.  Musculoskeletal:     Cervical back: Normal range of motion.     Right lower leg: No edema.     Left lower leg: No edema.  Lymphadenopathy:     Cervical: No cervical adenopathy.  Skin:    Capillary Refill: Capillary refill takes less than 2 seconds.  Neurological:     General: No focal deficit present.     Mental Status: She is alert.  Psychiatric:        Mood and Affect: Mood normal.        Behavior: Behavior normal.    Diabetic Foot Exam - Simple   Simple Foot Form Diabetic Foot exam was performed with the following findings: Yes 08/08/2023  3:22 PM  Visual Inspection No deformities, no ulcerations, no other skin breakdown bilaterally: Yes Sensation Testing Intact to touch and monofilament testing bilaterally: Yes Pulse Check Posterior Tibialis and Dorsalis pulse intact bilaterally: Yes Comments     Wt Readings from Last 3 Encounters:  08/08/23 153 lb (69.4 kg)  03/15/23 153 lb (69.4 kg)  09/27/22 155 lb (70.3 kg)    BP 128/74   Pulse 93   Ht 5\' 4"  (1.626 m)   Wt 153 lb (69.4 kg)   SpO2 94%   BMI 26.26 kg/m   Assessment and Plan:  Problem List Items Addressed This Visit       Unprioritized   Essential hypertension - Primary (Chronic)   Controlled BP with normal exam. Current regimen is lisinopril. Will continue same medications; encourage continued reduced sodium diet.       Relevant Medications   lovastatin (MEVACOR) 40 MG tablet   lisinopril (ZESTRIL) 5 MG tablet   Other Relevant Orders   CBC with Differential/Platelet   TSH   Hyperlipidemia associated with type 2 diabetes mellitus (HCC) (Chronic)   Controlled. Lab Results  Component Value Date   LDLCALC 81 09/27/2022         Relevant Medications   metFORMIN (GLUCOPHAGE-XR) 750 MG 24 hr tablet   lovastatin (MEVACOR) 40 MG tablet   lisinopril (ZESTRIL) 5 MG tablet    Other Relevant Orders   Lipid panel   Type II diabetes mellitus with complication (HCC) (Chronic)   Blood sugars stable without hypoglycemic symptoms or events. Currently managed with metformin. Changes made last visit are none. Lab Results  Component Value Date   HGBA1C 6.5 (H) 09/27/2022         Relevant Medications   metFORMIN (GLUCOPHAGE-XR) 750 MG 24 hr tablet   lovastatin (MEVACOR) 40 MG tablet   lisinopril (ZESTRIL) 5 MG tablet   Other Relevant Orders   Comprehensive metabolic panel   Hemoglobin A1c   Other Visit Diagnoses       Gastrointestinal ulcer due to Helicobacter pylori       Relevant Medications   pantoprazole (PROTONIX) 40 MG tablet   Other Relevant Orders   CBC with Differential/Platelet     Long term current use of oral hypoglycemic drug           Return in about 4 months (around 12/06/2023) for DM, HTN.    Reubin Milan, MD [facility] [place]

## 2023-08-09 LAB — TSH: TSH: 1.24 u[IU]/mL (ref 0.450–4.500)

## 2023-08-09 LAB — CBC WITH DIFFERENTIAL/PLATELET
Basophils Absolute: 0 10*3/uL (ref 0.0–0.2)
Basos: 1 %
EOS (ABSOLUTE): 0.5 10*3/uL — ABNORMAL HIGH (ref 0.0–0.4)
Eos: 7 %
Hematocrit: 41.4 % (ref 34.0–46.6)
Hemoglobin: 13.3 g/dL (ref 11.1–15.9)
Immature Grans (Abs): 0 10*3/uL (ref 0.0–0.1)
Immature Granulocytes: 0 %
Lymphocytes Absolute: 1.3 10*3/uL (ref 0.7–3.1)
Lymphs: 20 %
MCH: 27.9 pg (ref 26.6–33.0)
MCHC: 32.1 g/dL (ref 31.5–35.7)
MCV: 87 fL (ref 79–97)
Monocytes Absolute: 0.7 10*3/uL (ref 0.1–0.9)
Monocytes: 10 %
Neutrophils Absolute: 4.2 10*3/uL (ref 1.4–7.0)
Neutrophils: 62 %
Platelets: 189 10*3/uL (ref 150–450)
RBC: 4.76 x10E6/uL (ref 3.77–5.28)
RDW: 12.6 % (ref 11.7–15.4)
WBC: 6.7 10*3/uL (ref 3.4–10.8)

## 2023-08-09 LAB — LIPID PANEL
Chol/HDL Ratio: 3.1 {ratio} (ref 0.0–4.4)
Cholesterol, Total: 141 mg/dL (ref 100–199)
HDL: 45 mg/dL (ref >39)
LDL Chol Calc (NIH): 75 mg/dL (ref 0–99)
Triglycerides: 118 mg/dL (ref 0–149)
VLDL Cholesterol Cal: 21 mg/dL (ref 5–40)

## 2023-08-09 LAB — COMPREHENSIVE METABOLIC PANEL
ALT: 10 [IU]/L (ref 0–32)
AST: 18 [IU]/L (ref 0–40)
Albumin: 3.9 g/dL (ref 3.6–4.6)
Alkaline Phosphatase: 109 [IU]/L (ref 44–121)
BUN/Creatinine Ratio: 22 (ref 12–28)
BUN: 18 mg/dL (ref 10–36)
Bilirubin Total: 0.3 mg/dL (ref 0.0–1.2)
CO2: 26 mmol/L (ref 20–29)
Calcium: 9.7 mg/dL (ref 8.7–10.3)
Chloride: 101 mmol/L (ref 96–106)
Creatinine, Ser: 0.81 mg/dL (ref 0.57–1.00)
Globulin, Total: 2.8 g/dL (ref 1.5–4.5)
Glucose: 135 mg/dL — ABNORMAL HIGH (ref 70–99)
Potassium: 5.2 mmol/L (ref 3.5–5.2)
Sodium: 139 mmol/L (ref 134–144)
Total Protein: 6.7 g/dL (ref 6.0–8.5)
eGFR: 66 mL/min/{1.73_m2} (ref >59)

## 2023-08-09 LAB — HEMOGLOBIN A1C
Est. average glucose Bld gHb Est-mCnc: 137 mg/dL
Hgb A1c MFr Bld: 6.4 % — ABNORMAL HIGH (ref 4.8–5.6)

## 2023-10-12 ENCOUNTER — Encounter: Payer: Self-pay | Admitting: Podiatry

## 2023-10-12 ENCOUNTER — Ambulatory Visit (INDEPENDENT_AMBULATORY_CARE_PROVIDER_SITE_OTHER): Payer: Medicare Other | Admitting: Podiatry

## 2023-10-12 DIAGNOSIS — B351 Tinea unguium: Secondary | ICD-10-CM

## 2023-10-12 DIAGNOSIS — M79675 Pain in left toe(s): Secondary | ICD-10-CM

## 2023-10-12 DIAGNOSIS — E118 Type 2 diabetes mellitus with unspecified complications: Secondary | ICD-10-CM

## 2023-10-12 DIAGNOSIS — M79674 Pain in right toe(s): Secondary | ICD-10-CM

## 2023-10-19 ENCOUNTER — Encounter: Payer: Self-pay | Admitting: Podiatry

## 2023-10-19 NOTE — Progress Notes (Signed)
  Subjective:  Patient ID: Penny Andrews, female    DOB: [birth year],  MRN: [id number]  Penny Andrews presents to clinic today for preventative diabetic foot care and painful elongated mycotic toenails 1-5 bilaterally which are tender when wearing enclosed shoe gear. Pain is relieved with periodic professional debridement.  Chief Complaint  Patient presents with   Diabetes    "Trim my toenails."   New problem(s): None.   PCP is Sheron Dixons, MD.  Allergies  Allergen Reactions   Penicillins Swelling    Review of Systems: Negative except as noted in the HPI.  Objective: No changes noted in today's physical examination. There were no vitals filed for this visit. Penny Andrews is a pleasant 89 y.o. female WD, WN in NAD. AAO x 3.  Vascular Examination: Capillary refill time immediate b/l. Vascular status intact b/l with palpable DP pulses; faintly palpable PT pulses. Pedal hair diminished b/l. No edema. No pain with calf compression b/l. Skin temperature gradient WNL b/l. No cyanosis or clubbing noted b/l. No ischemia or gangrene b/l.   Neurological Examination: Sensation grossly intact b/l with 10 gram monofilament. Vibratory sensation intact b/l.   Dermatological Examination: Pedal skin with normal turgor, texture and tone b/l.  No open wounds. No interdigital macerations.   Toenails 1-5 b/l thick, discolored, elongated with subungual debris and pain on dorsal palpation.   No corns, calluses nor porokeratotic lesions noted.  Musculoskeletal Examination: Muscle strength 5/5 to all lower extremity muscle groups bilaterally. No pain, crepitus or joint limitation noted with ROM bilateral LE.  Radiographs: None  Last A1c:      Latest Ref Rng & Units 08/08/2023    3:53 PM  Hemoglobin A1C  Hemoglobin-A1c 4.8 - 5.6 % 6.4    Assessment/Plan: 1. Pain due to onychomycosis of toenails of both feet   2. Type II diabetes mellitus with complication [facility])     Consent given for  treatment. Patient examined. All patient's and/or POA's questions/concerns addressed on today's visit. Mycotic toenails 1-5 debrided in length and girth without incident. Continue foot and shoe inspections daily. Monitor blood glucose per PCP/Endocrinologist's recommendations.Continue soft, supportive shoe gear daily. Report any pedal injuries to medical professional. Call office if there are any quesitons/concerns. -Patient/POA to call should there be question/concern in the interim.   Return in about 3 months (around 01/11/2024).  Luella Sager, DPM      [facility] LOCATION: [address], [place] [address]                   Office [phone]   [facility] LOCATION: [address] [place], [place] [address] Office [phone]

## 2023-10-26 ENCOUNTER — Ambulatory Visit: Payer: Self-pay | Admitting: *Deleted

## 2023-10-26 ENCOUNTER — Ambulatory Visit: Admitting: Internal Medicine

## 2023-10-26 NOTE — Telephone Encounter (Signed)
 Copied from CRM [id number]. Topic: Appointments - Appointment Scheduling >> Oct 26, 2023  9:05 AM Penny Andrews wrote: Check ears.. may be clogged?  Clean ears?  She isn't hearing clearly.. daughter called in Penny Andrews.. number on file good ( prefers afternoon appt) Reason for Disposition  Decreased hearing (gradual) associated with aging  Answer Assessment - Initial Assessment Questions 1. DESCRIPTION: "What type of hearing problem are you having? Describe it for me." (e.Andrews., complete hearing loss, partial loss)     Daughter, Penny Andrews calling in.    My mother can't hear.   She's 88 yrs old.   She doesn't tell me things.  She doesn't want to be a problem.  During the week I'm talking louder to her.   She's not hearing me very well.   We changed her hearing aid batteries.   We change them now twice a week. Within the last week this is a big issue.   She can't hear on the phone now. 2. LOCATION: "One or both ears?" If one, ask: "Which ear?"     Both ears.    Her appt with Dr. Gala Andrews is not until June.   I was wondering if they need cleaning.   3. SEVERITY: "Can you hear anything?" If Yes, ask: "What can you hear?" (e.Andrews., ticking watch, whisper, talking)   - MILD:  Difficulty hearing soft speech, quiet library sounds, or speech from a distance or over background noise.   - MODERATE: Difficulty hearing normal speech even at closed distances.   - SEVERE: Unable to hear most normal conversation and talking; only able to hear loud sounds such as an alarm clock.     Moderate 4. ONSET: "When did this begin?" "Did it start suddenly or come on gradually?"     Over the last week she is not hearing very well. 5. PATTERN: "Does this come and go, or has it been constant since it started?"     constant 6. PAIN: "Is there any pain in your ear(s)?"  (Scale 1-10; or mild, moderate, severe)   - NONE (0): no pain   - MILD (1-3): doesn't interfere with normal activities    - MODERATE (4-7): interferes with normal  activities or awakens from sleep    - SEVERE (8-10): excruciating pain, unable to do any normal activities      No pain 7. CAUSE: "What do you think is causing this hearing problem?"     I think her ears need cleaning.   8. OTHER SYMPTOMS: "Do you have any other symptoms?" (e.Andrews., dizziness, ringing in ears)     No 9. PREGNANCY: "Is there any chance you are pregnant?" "When was your last menstrual period?"     N/A due to age  Protocols used: Hearing Loss or Change-A-AH  Chief Complaint: Daughter, Penny Andrews calling in for mother.   Her mother is not hearing very well especially over the past week.   Her hearing aids are clean and the batteries have been changed.    Penny Andrews thinks her mother's ears may need cleaning out.   She's had to have her ears cleaned before. Symptoms: Difficulty hearing over the last week it's been worse Frequency: The last week Pertinent Negatives: Patient denies it being anything with her hearing aids. Disposition: [] ED /[] Urgent Care (no appt availability in office) / [x] Appointment(In office/virtual)/ []  [facility]/ [] Home Care/ [] Refused Recommended Disposition /[] [facility]/ []  Follow-up with PCP Additional Notes: Appt made for 10/27/2023 in the  afternoon at [facility] request with Dr. Gala Andrews.   Had an appt for today at 11:00 with Dr. Gala Andrews but she wanted that cancelled because unable to make it in today.

## 2023-10-26 NOTE — Telephone Encounter (Signed)
 Noted  KP

## 2023-10-27 ENCOUNTER — Encounter: Payer: Self-pay | Admitting: Internal Medicine

## 2023-10-27 ENCOUNTER — Ambulatory Visit (INDEPENDENT_AMBULATORY_CARE_PROVIDER_SITE_OTHER): Admitting: Internal Medicine

## 2023-10-27 VITALS — BP 130/74 | HR 66 | Ht 64.0 in | Wt 152.2 lb

## 2023-10-27 DIAGNOSIS — H6123 Impacted cerumen, bilateral: Secondary | ICD-10-CM | POA: Diagnosis not present

## 2023-10-27 DIAGNOSIS — L719 Rosacea, unspecified: Secondary | ICD-10-CM

## 2023-10-27 NOTE — Progress Notes (Signed)
 Date:  10/27/2023   Name:  Penny Andrews   DOB:  September 30, 1924   MRN:  [id number]   Chief Complaint: Ear Fullness and Rash  Ear Fullness  There is pain in both ears. This is a new problem. Associated symptoms include hearing loss and a rash. She has tried nothing for the symptoms.  Rash This is a recurrent problem. Progression since onset: comes and goes - pt has no symptoms. The affected locations include the face. She was exposed to nothing. Pertinent negatives include no fatigue, fever or shortness of breath.    Review of Systems  Constitutional:  Negative for chills, fatigue and fever.  HENT:  Positive for hearing loss.   Respiratory:  Negative for chest tightness and shortness of breath.   Cardiovascular:  Negative for chest pain.  Skin:  Positive for rash.     Lab Results  Component Value Date   NA 139 08/08/2023   K 5.2 08/08/2023   CO2 26 08/08/2023   GLUCOSE 135 (H) 08/08/2023   BUN 18 08/08/2023   CREATININE 0.81 08/08/2023   CALCIUM 9.7 08/08/2023   EGFR 66 08/08/2023   GFRNONAA 57 (L) 04/30/2020   Lab Results  Component Value Date   CHOL 141 08/08/2023   HDL 45 08/08/2023   LDLCALC 75 08/08/2023   TRIG 118 08/08/2023   CHOLHDL 3.1 08/08/2023   Lab Results  Component Value Date   TSH 1.240 08/08/2023   Lab Results  Component Value Date   HGBA1C 6.4 (H) 08/08/2023   Lab Results  Component Value Date   WBC 6.7 08/08/2023   HGB 13.3 08/08/2023   HCT 41.4 08/08/2023   MCV 87 08/08/2023   PLT 189 08/08/2023   Lab Results  Component Value Date   ALT 10 08/08/2023   AST 18 08/08/2023   ALKPHOS 109 08/08/2023   BILITOT 0.3 08/08/2023   Lab Results  Component Value Date   VD25OH 42.3 04/30/2020     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   Essential hypertension 10/31/2014   Hyperlipidemia  associated with type 2 diabetes mellitus (HCC) 10/31/2014   Hx of gastric ulcer 10/31/2014   Has a tremor 10/31/2014   Avitaminosis D 10/31/2014   Type II diabetes mellitus with complication (HCC) 12/27/2013    Allergies  Allergen Reactions   Penicillins Swelling    Past Surgical History:  Procedure Laterality Date   ESOPHAGOGASTRODUODENOSCOPY  07/2014   grade D esophagitis; prepyloric ulcer   EYE SURGERY Bilateral    cataracts   TONSILLECTOMY     UMBILICAL HERNIA REPAIR      Social History   Tobacco Use   Smoking status: Never   Smokeless tobacco: Never   Tobacco comments:    smoking cessation materials not required  Vaping Use   Vaping status: Never Used  Substance Use Topics   Alcohol use: Not Currently    Comment: occasionally once a year   Drug use: No     Medication list has been reviewed and updated.  Current Meds  Medication Sig   ACCU-CHEK SOFTCLIX LANCETS lancets    Acetylcysteine (NAC 600 PO) Take by mouth daily.   Apoaequorin (PREVAGEN PO) Take by mouth daily.   Blood Glucose Monitoring Suppl (ACCU-CHEK AVIVA PLUS) W/DEVICE KIT    cholecalciferol (VITAMIN D) 1000 UNITS tablet Take  1,000 Units by mouth daily.   glucose blood test strip    lisinopril  (ZESTRIL ) 5 MG tablet TAKE 1 TABLET(5 MG) BY MOUTH DAILY   lovastatin  (MEVACOR ) 40 MG tablet TAKE 1 TABLET(40 MG) BY MOUTH EVERY EVENING   magnesium 30 MG tablet Take 400 mg by mouth daily.   metFORMIN  (GLUCOPHAGE -XR) 750 MG 24 hr tablet Take 1 tablet (750 mg total) by mouth daily with breakfast.   Multiple Vitamin (MULTIVITAMIN WITH MINERALS) TABS tablet Take 1 tablet by mouth daily.   pantoprazole  (PROTONIX ) 40 MG tablet Take 1 tablet (40 mg total) by mouth daily.   vitamin C (ASCORBIC ACID) 500 MG tablet Take by mouth.   Zinc 10 MG LOZG Use as directed in the mouth or throat.       10/27/2023    2:08 PM 08/08/2023    3:11 PM 07/20/2022    3:28 PM 03/14/2022    2:49 PM  GAD 7 : Generalized Anxiety  Score  Nervous, Anxious, on Edge 0 0 0 0  Control/stop worrying 0 0 0 0  Worry too much - different things  0 0 0  Trouble relaxing  0 0 0  Restless  0 0 0  Easily annoyed or irritable  0 0 0  Afraid - awful might happen  0 0 0  Total GAD 7 Score  0 0 0  Anxiety Difficulty  Not difficult at all Not difficult at all Not difficult at all       10/27/2023    2:08 PM 08/08/2023    3:11 PM 07/20/2022    3:28 PM  Depression screen PHQ 2/9  Decreased Interest 0 0 0  Down, Depressed, Hopeless 0 0 0  PHQ - 2 Score 0 0 0  Altered sleeping  0 0  Tired, decreased energy  3 0  Change in appetite  0 0  Feeling bad or failure about yourself   0 0  Trouble concentrating  0 0  Moving slowly or fidgety/restless  0 0  Suicidal thoughts  0 0  PHQ-9 Score  3 0  Difficult doing work/chores  Not difficult at all Not difficult at all    BP Readings from Last 3 Encounters:  10/27/23 130/74  08/08/23 128/74  03/15/23 134/62    Physical Exam Vitals and nursing note reviewed.  Constitutional:      General: She is not in acute distress.    Appearance: She is well-developed.  HENT:     Head: Normocephalic and atraumatic.     Comments:      Right Ear: Tympanic membrane normal. Decreased hearing noted.     Left Ear: Tympanic membrane normal. Decreased hearing noted.     Ears:     Comments: Excessive cerumen both ears Wax flushed from left ear and then snagged with curette. Right side was adherent to ear canal - midway and unable to be flushed.  Too deep for curette.  Cardiovascular:     Rate and Rhythm: Normal rate and regular rhythm.  Pulmonary:     Effort: Pulmonary effort is normal. No respiratory distress.     Breath sounds: No wheezing or rhonchi.  Musculoskeletal:     Right lower leg: No edema.     Left lower leg: No edema.  Skin:    General: Skin is warm and dry.     Findings: Erythema present. No rash.     Comments: Across cheeks and chin  Neurological:     Mental Status: She  is  alert and oriented to person, place, and time.  Psychiatric:        Mood and Affect: Mood normal.        Behavior: Behavior normal.     Wt Readings from Last 3 Encounters:  10/27/23 152 lb 4 oz (69.1 kg)  08/08/23 153 lb (69.4 kg)  03/15/23 153 lb (69.4 kg)    BP 130/74   Pulse 66   Ht 5\' 4"  (1.626 m)   Wt 152 lb 4 oz (69.1 kg)   SpO2 98%   BMI 26.13 kg/m   Assessment and Plan:  Problem List Items Addressed This Visit   None Visit Diagnoses       Excessive cerumen in both ear canals    -  Primary   left ear cleaned; right has minimal cerumen remaining this is not impacting her poor hearing     Rosacea       probably rosacea but pateint does not notice or have symptoms will defer any treatment at this time       No follow-ups on file.    Sheron Dixons, MD [facility] [place]

## 2023-11-29 ENCOUNTER — Other Ambulatory Visit: Payer: Self-pay | Admitting: Internal Medicine

## 2023-11-29 DIAGNOSIS — E118 Type 2 diabetes mellitus with unspecified complications: Secondary | ICD-10-CM

## 2023-11-29 DIAGNOSIS — I10 Essential (primary) hypertension: Secondary | ICD-10-CM

## 2023-11-30 NOTE — Telephone Encounter (Signed)
 Requested by interface surescripts. Last OV 3 months ago .  Requested Prescriptions  Pending Prescriptions Disp Refills   lisinopril  (ZESTRIL ) 5 MG tablet [Pharmacy Med Name: LISINOPRIL  5MG  TABLETS] 90 tablet 0    Sig: TAKE 1 TABLET(5 MG) BY MOUTH DAILY     Cardiovascular:  ACE Inhibitors Passed - 11/30/2023  2:10 PM      Passed - Cr in normal range and within 180 days    Creatinine, Ser  Date Value Ref Range Status  08/08/2023 0.81 0.57 - 1.00 mg/dL Final         Passed - K in normal range and within 180 days    Potassium  Date Value Ref Range Status  08/08/2023 5.2 3.5 - 5.2 mmol/L Final         Passed - Patient is not pregnant      Passed - Last BP in normal range    BP Readings from Last 1 Encounters:  10/27/23 130/74         Passed - Valid encounter within last 6 months    Recent Outpatient Visits           1 month ago Excessive cerumen in both ear canals   [place] [facility] at [facility], [place], MD   3 months ago Essential hypertension   [place] [facility] at [facility], [place], MD              Refused Prescriptions Disp Refills   metFORMIN  (GLUCOPHAGE -XR) 750 MG 24 hr tablet [Pharmacy Med Name: METFORMIN  ER 750MG  24HR TABS] 90 tablet 1    Sig: TAKE 1 TABLET(750 MG) BY MOUTH DAILY WITH BREAKFAST     Endocrinology:  Diabetes - Biguanides Failed - 11/30/2023  2:10 PM      Failed - B12 Level in normal range and within 720 days    No results found for: VITAMINB12       Passed - Cr in normal range and within 360 days    Creatinine, Ser  Date Value Ref Range Status  08/08/2023 0.81 0.57 - 1.00 mg/dL Final         Passed - HBA1C is between 0 and 7.9 and within 180 days    Hgb A1c MFr Bld  Date Value Ref Range Status  08/08/2023 6.4 (H) 4.8 - 5.6 % Final    Comment:             Prediabetes: 5.7 - 6.4          Diabetes: >6.4          Glycemic control for adults with diabetes:  <7.0          Passed - eGFR in normal range and within 360 days    GFR calc Af Amer  Date Value Ref Range Status  04/30/2020 65 >59 mL/min/1.73 Final    Comment:    **In accordance with recommendations from the NKF-ASN Task force,**   [facility] is in the process of updating its eGFR calculation to the   2021 CKD-EPI creatinine equation that estimates kidney function   without a race variable.    GFR calc non Af Amer  Date Value Ref Range Status  04/30/2020 57 (L) >59 mL/min/1.73 Final   eGFR  Date Value Ref Range Status  08/08/2023 66 >59 mL/min/1.73 Final         Passed - Valid encounter within last 6 months  Recent Outpatient Visits           1 month ago Excessive cerumen in both ear canals   [place] [facility] at [facility], Chales [place], MD   3 months ago Essential hypertension   [place] [facility] at [facility], Chales [place], MD              Passed - CBC within normal limits and completed in the last 12 months    WBC  Date Value Ref Range Status  08/08/2023 6.7 3.4 - 10.8 x10E3/uL Final   RBC  Date Value Ref Range Status  08/08/2023 4.76 3.77 - 5.28 x10E6/uL Final   Hemoglobin  Date Value Ref Range Status  08/08/2023 13.3 11.1 - 15.9 g/dL Final   Hematocrit  Date Value Ref Range Status  08/08/2023 41.4 34.0 - 46.6 % Final   MCHC  Date Value Ref Range Status  08/08/2023 32.1 31.5 - 35.7 g/dL Final   [facility]  Date Value Ref Range Status  08/08/2023 27.9 26.6 - 33.0 pg Final   MCV  Date Value Ref Range Status  08/08/2023 87 79 - 97 fL Final   No results found for: PLTCOUNTKUC, LABPLAT, POCPLA RDW  Date Value Ref Range Status  08/08/2023 12.6 11.7 - 15.4 % Final

## 2023-11-30 NOTE — Telephone Encounter (Signed)
 Requested by interface surescripts. Last refill documented refill 11/28/23 #90 . Requesting too soon.  Requested Prescriptions  Pending Prescriptions Disp Refills   lisinopril  (ZESTRIL ) 5 MG tablet [Pharmacy Med Name: LISINOPRIL  5MG  TABLETS] 90 tablet 1    Sig: TAKE 1 TABLET(5 MG) BY MOUTH DAILY     Cardiovascular:  ACE Inhibitors Passed - 11/30/2023  2:09 PM      Passed - Cr in normal range and within 180 days    Creatinine, Ser  Date Value Ref Range Status  08/08/2023 0.81 0.57 - 1.00 mg/dL Final         Passed - K in normal range and within 180 days    Potassium  Date Value Ref Range Status  08/08/2023 5.2 3.5 - 5.2 mmol/L Final         Passed - Patient is not pregnant      Passed - Last BP in normal range    BP Readings from Last 1 Encounters:  10/27/23 130/74         Passed - Valid encounter within last 6 months    Recent Outpatient Visits           1 month ago Excessive cerumen in both ear canals   [place] [facility] at [facility], Chales [place], MD   3 months ago Essential hypertension   [place] [facility] at [facility], Chales [place], MD              Refused Prescriptions Disp Refills   metFORMIN  (GLUCOPHAGE -XR) 750 MG 24 hr tablet [Pharmacy Med Name: METFORMIN  ER 750MG  24HR TABS] 90 tablet 1    Sig: TAKE 1 TABLET(750 MG) BY MOUTH DAILY WITH BREAKFAST     Endocrinology:  Diabetes - Biguanides Failed - 11/30/2023  2:09 PM      Failed - B12 Level in normal range and within 720 days    No results found for: VITAMINB12       Passed - Cr in normal range and within 360 days    Creatinine, Ser  Date Value Ref Range Status  08/08/2023 0.81 0.57 - 1.00 mg/dL Final         Passed - HBA1C is between 0 and 7.9 and within 180 days    Hgb A1c MFr Bld  Date Value Ref Range Status  08/08/2023 6.4 (H) 4.8 - 5.6 % Final    Comment:             Prediabetes: 5.7 - 6.4          Diabetes: >6.4           Glycemic control for adults with diabetes: <7.0          Passed - eGFR in normal range and within 360 days    GFR calc Af Amer  Date Value Ref Range Status  04/30/2020 65 >59 mL/min/1.73 Final    Comment:    **In accordance with recommendations from the NKF-ASN Task force,**   [facility] is in the process of updating its eGFR calculation to the   2021 CKD-EPI creatinine equation that estimates kidney function   without a race variable.    GFR calc non Af Amer  Date Value Ref Range Status  04/30/2020 57 (L) >59 mL/min/1.73 Final   eGFR  Date Value Ref Range Status  08/08/2023 66 >59 mL/min/1.73 Final         Passed - Valid encounter within last 6  months    Recent Outpatient Visits           1 month ago Excessive cerumen in both ear canals   [place] [facility], Chales [place], MD   3 months ago Essential hypertension   [place] [facility] at [facility], Chales [place], MD              Passed - CBC within normal limits and completed in the last 12 months    WBC  Date Value Ref Range Status  08/08/2023 6.7 3.4 - 10.8 x10E3/uL Final   RBC  Date Value Ref Range Status  08/08/2023 4.76 3.77 - 5.28 x10E6/uL Final   Hemoglobin  Date Value Ref Range Status  08/08/2023 13.3 11.1 - 15.9 g/dL Final   Hematocrit  Date Value Ref Range Status  08/08/2023 41.4 34.0 - 46.6 % Final   MCHC  Date Value Ref Range Status  08/08/2023 32.1 31.5 - 35.7 g/dL Final   [facility]  Date Value Ref Range Status  08/08/2023 27.9 26.6 - 33.0 pg Final   MCV  Date Value Ref Range Status  08/08/2023 87 79 - 97 fL Final   No results found for: PLTCOUNTKUC, LABPLAT, POCPLA RDW  Date Value Ref Range Status  08/08/2023 12.6 11.7 - 15.4 % Final

## 2023-12-06 ENCOUNTER — Ambulatory Visit: Payer: Medicare Other | Admitting: Internal Medicine

## 2023-12-29 ENCOUNTER — Ambulatory Visit (INDEPENDENT_AMBULATORY_CARE_PROVIDER_SITE_OTHER): Admitting: Internal Medicine

## 2023-12-29 ENCOUNTER — Encounter: Payer: Self-pay | Admitting: Internal Medicine

## 2023-12-29 VITALS — BP 102/68 | HR 73 | Ht 64.0 in | Wt 148.6 lb

## 2023-12-29 DIAGNOSIS — I10 Essential (primary) hypertension: Secondary | ICD-10-CM | POA: Diagnosis not present

## 2023-12-29 DIAGNOSIS — E118 Type 2 diabetes mellitus with unspecified complications: Secondary | ICD-10-CM

## 2023-12-29 DIAGNOSIS — Z7984 Long term (current) use of oral hypoglycemic drugs: Secondary | ICD-10-CM | POA: Diagnosis not present

## 2023-12-29 DIAGNOSIS — K130 Diseases of lips: Secondary | ICD-10-CM | POA: Diagnosis not present

## 2023-12-29 LAB — POCT GLYCOSYLATED HEMOGLOBIN (HGB A1C): Hemoglobin A1C: 5.9 % — AB (ref 4.0–5.6)

## 2023-12-29 NOTE — Progress Notes (Signed)
 Date:  12/29/2023   Name:  Penny Andrews   DOB:  [birth year]   MRN:  [id number]   Chief Complaint: Hypertension and Diabetes  Hypertension This is a chronic problem. The problem is controlled. Pertinent negatives include no chest pain, headaches, palpitations or shortness of breath.  Diabetes She presents for her follow-up diabetic visit. She has type 2 diabetes mellitus. Her disease course has been stable. Pertinent negatives for hypoglycemia include no dizziness, headaches or nervousness/anxiousness. Pertinent negatives for diabetes include no chest pain, no fatigue and no weakness.  Lip lesion - new 5 days ago, crusting but not painful per patient.  Using Blistex as needed.  No prior history of lip lesions.  Review of Systems  Constitutional:  Negative for fatigue and unexpected weight change.  HENT:  Negative for trouble swallowing.   Eyes:  Negative for visual disturbance.  Respiratory:  Negative for cough, chest tightness, shortness of breath and wheezing.   Cardiovascular:  Negative for chest pain, palpitations and leg swelling.  Gastrointestinal:  Negative for abdominal pain, constipation and diarrhea.  Musculoskeletal:  Positive for gait problem. Negative for arthralgias and myalgias.  Neurological:  Negative for dizziness, weakness, light-headedness and headaches.  Psychiatric/Behavioral:  Positive for decreased concentration. Negative for dysphoric mood and sleep disturbance. The patient is not nervous/anxious.      Lab Results  Component Value Date   NA 139 08/08/2023   K 5.2 08/08/2023   CO2 26 08/08/2023   GLUCOSE 135 (H) 08/08/2023   BUN 18 08/08/2023   CREATININE 0.81 08/08/2023   CALCIUM 9.7 08/08/2023   EGFR 66 08/08/2023   GFRNONAA 57 (L) 04/30/2020   Lab Results  Component Value Date   CHOL 141 08/08/2023   HDL 45 08/08/2023   LDLCALC 75 08/08/2023   TRIG 118 08/08/2023   CHOLHDL 3.1 08/08/2023   Lab Results  Component Value Date   TSH 1.240  08/08/2023   Lab Results  Component Value Date   HGBA1C 5.9 (A) 12/29/2023   Lab Results  Component Value Date   WBC 6.7 08/08/2023   HGB 13.3 08/08/2023   HCT 41.4 08/08/2023   MCV 87 08/08/2023   PLT 189 08/08/2023   Lab Results  Component Value Date   ALT 10 08/08/2023   AST 18 08/08/2023   ALKPHOS 109 08/08/2023   BILITOT 0.3 08/08/2023   Lab Results  Component Value Date   VD25OH 42.3 04/30/2020     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   Essential hypertension 10/31/2014   Hyperlipidemia associated with type 2 diabetes mellitus (HCC) 10/31/2014   Hx of gastric ulcer 10/31/2014   Has a tremor 10/31/2014   Avitaminosis D 10/31/2014   Type II diabetes mellitus with complication (HCC) 12/27/2013    Allergies  Allergen Reactions   Penicillins Swelling    Past Surgical History:  Procedure Laterality Date   ESOPHAGOGASTRODUODENOSCOPY  07/2014   grade D esophagitis; prepyloric ulcer   EYE SURGERY Bilateral    cataracts   TONSILLECTOMY     UMBILICAL HERNIA REPAIR      Social History   Tobacco Use   Smoking status: Never   Smokeless tobacco: Never   Tobacco comments:    smoking cessation materials not required  Vaping Use   Vaping status: Never Used  Substance Use Topics  Alcohol use: Not Currently    Comment: occasionally once a year   Drug use: No     Medication list has been reviewed and updated.  Current Meds  Medication Sig   ACCU-CHEK SOFTCLIX LANCETS lancets    Acetylcysteine (NAC 600 PO) Take by mouth daily.   Apoaequorin (PREVAGEN PO) Take by mouth daily.   aspirin 81 MG tablet Take 81 mg by mouth every evening.   Blood Glucose Monitoring Suppl (ACCU-CHEK AVIVA PLUS) W/DEVICE KIT    cholecalciferol (VITAMIN D) 1000 UNITS tablet Take 1,000 Units by mouth daily.   glucose blood test  strip    lisinopril  (ZESTRIL ) 5 MG tablet TAKE 1 TABLET(5 MG) BY MOUTH DAILY   lovastatin  (MEVACOR ) 40 MG tablet TAKE 1 TABLET(40 MG) BY MOUTH EVERY EVENING   magnesium 30 MG tablet Take 400 mg by mouth daily.   metFORMIN  (GLUCOPHAGE -XR) 750 MG 24 hr tablet Take 1 tablet (750 mg total) by mouth daily with breakfast.   Multiple Vitamin (MULTIVITAMIN WITH MINERALS) TABS tablet Take 1 tablet by mouth daily.   pantoprazole  (PROTONIX ) 40 MG tablet Take 1 tablet (40 mg total) by mouth daily.   vitamin C (ASCORBIC ACID) 500 MG tablet Take by mouth.   Zinc 10 MG LOZG Use as directed in the mouth or throat.       12/29/2023    2:32 PM 10/27/2023    2:08 PM 08/08/2023    3:11 PM 07/20/2022    3:28 PM  GAD 7 : Generalized Anxiety Score  Nervous, Anxious, on Edge 0 0 0 0  Control/stop worrying 0 0 0 0  Worry too much - different things 0  0 0  Trouble relaxing 0  0 0  Restless 0  0 0  Easily annoyed or irritable 0  0 0  Afraid - awful might happen 0  0 0  Total GAD 7 Score 0  0 0  Anxiety Difficulty Not difficult at all  Not difficult at all Not difficult at all       12/29/2023    2:32 PM 10/27/2023    2:08 PM 08/08/2023    3:11 PM  Depression screen PHQ 2/9  Decreased Interest 0 0 0  Down, Depressed, Hopeless 0 0 0  PHQ - 2 Score 0 0 0  Altered sleeping 0  0  Tired, decreased energy 0  3  Change in appetite 0  0  Feeling bad or failure about yourself  0  0  Trouble concentrating 0  0  Moving slowly or fidgety/restless 0  0  Suicidal thoughts 0  0  PHQ-9 Score 0  3  Difficult doing work/chores Not difficult at all  Not difficult at all    BP Readings from Last 3 Encounters:  12/29/23 102/68  10/27/23 130/74  08/08/23 128/74    Physical Exam Vitals and nursing note reviewed.  Constitutional:      General: She is not in acute distress.    Appearance: Normal appearance. She is well-developed.  HENT:     Head: Normocephalic and atraumatic.  Neck:     Vascular: No carotid bruit.   Cardiovascular:     Rate and Rhythm: Normal rate and regular rhythm.  Pulmonary:     Effort: Pulmonary effort is normal. No respiratory distress.     Breath sounds: No wheezing or rhonchi.  Musculoskeletal:     Cervical back: Normal range of motion.     Right lower leg: No edema.  Left lower leg: No edema.  Lymphadenopathy:     Cervical: No cervical adenopathy.  Skin:    General: Skin is warm and dry.     Findings: No rash.  Neurological:     General: No focal deficit present.     Mental Status: She is alert. Mental status is at baseline.  Psychiatric:        Mood and Affect: Mood normal.        Behavior: Behavior normal.     Wt Readings from Last 3 Encounters:  12/29/23 148 lb 9.6 oz (67.4 kg)  10/27/23 152 lb 4 oz (69.1 kg)  08/08/23 153 lb (69.4 kg)    BP 102/68   Pulse 73   Ht 5' 4 (1.626 m)   Wt 148 lb 9.6 oz (67.4 kg)   SpO2 100%   BMI 25.51 kg/m   Assessment and Plan:  Problem List Items Addressed This Visit       Unprioritized   Essential hypertension - Primary (Chronic)   Blood pressure is well controlled.  Current medications lisinopril . Will continue same regimen along with efforts to limit dietary sodium.       Type II diabetes mellitus with complication (HCC) (Chronic)   Blood sugars have been stable.  No recent hypoglycemic events requiring assistance. Currently medications are MTF. Lab Results  Component Value Date   HGBA1C 6.4 (H) 08/08/2023   Last visit no changes were made. A1C today = 5.9.  continue same medications.       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    Leita HILARIO Adie, MD [facility] [place]

## 2023-12-29 NOTE — Assessment & Plan Note (Addendum)
 Blood sugars have been stable.  No recent hypoglycemic events requiring assistance. Currently medications are MTF. Lab Results  Component Value Date   HGBA1C 6.4 (H) 08/08/2023   Last visit no changes were made. A1C today = 5.9.  continue same medications.

## 2023-12-29 NOTE — Assessment & Plan Note (Signed)
 Blood pressure is well controlled.  Current medications lisinopril. Will continue same regimen along with efforts to limit dietary sodium.

## 2024-01-11 ENCOUNTER — Encounter: Payer: Self-pay | Admitting: Podiatry

## 2024-01-11 ENCOUNTER — Ambulatory Visit: Admitting: Podiatry

## 2024-01-11 DIAGNOSIS — E118 Type 2 diabetes mellitus with unspecified complications: Secondary | ICD-10-CM | POA: Diagnosis not present

## 2024-01-11 DIAGNOSIS — M79675 Pain in left toe(s): Secondary | ICD-10-CM

## 2024-01-11 DIAGNOSIS — M79674 Pain in right toe(s): Secondary | ICD-10-CM

## 2024-01-11 DIAGNOSIS — B351 Tinea unguium: Secondary | ICD-10-CM

## 2024-01-11 NOTE — Progress Notes (Signed)
  Subjective:  Patient ID: Penny Andrews, female    DOB: [birth year],  MRN: [id number]  Penny Andrews presents to clinic today for preventative diabetic foot care and painful thick toenails that are difficult to trim. Pain interferes with ambulation. Aggravating factors include wearing enclosed shoe gear. Pain is relieved with periodic professional debridement.  Chief Complaint  Patient presents with   Nail Problem    Thick painful toenails, 3 month follow up    New problem(s): None.   PCP is Justus Leita DEL, MD. Penny Andrews 12/29/2023.  Allergies  Allergen Reactions   Penicillins Swelling    Review of Systems: Negative except as noted in the HPI.  Objective:  There were no vitals filed for this visit. Penny Andrews is a pleasant 89 y.o. female WD, WN in NAD. AAO x 3.  Vascular Examination: Capillary refill time immediate b/l. Vascular status intact b/l with palpable DP pulses; faintly palpable PT pulses. Pedal hair decreased b/l. No edema. No pain with calf compression b/l. Skin temperature gradient WNL b/l. No cyanosis or clubbing noted b/l. No ischemia or gangrene b/l.   Neurological Examination: Sensation grossly intact b/l with 10 gram monofilament. Vibratory sensation intact b/l.   Dermatological Examination: Pedal skin with normal turgor, texture and tone b/l.  No open wounds. No interdigital macerations.   Toenails left great toe and 2-5 b/l thick, discolored, elongated with subungual debris and pain on dorsal palpation. Incurvated nailplate right great toe lateral border(s) with tenderness to palpation. No erythema, no edema, no drainage noted.   No hyperkeratotic nor porokeratotic lesions.  Musculoskeletal Examination: Muscle strength 5/5 to all lower extremity muscle groups bilaterally. No pain, crepitus or joint limitation noted with ROM bilateral LE. No gross bony deformities bilaterally.  Radiographs: None  Last A1c:      Latest Ref Rng & Units 12/29/2023     2:53 PM 08/08/2023    3:53 PM  Hemoglobin A1C  Hemoglobin-A1c 4.0 - 5.6 % 5.9  6.4    Assessment/Plan: 1. Pain due to onychomycosis of toenails of both feet   2. Type II diabetes mellitus with complication [facility])     Patient was evaluated and treated. All patient's and/or POA's questions/concerns addressed on today's visit. Toenails left great toe 2-5 debrided in length and girth without incident. Continue foot and shoe inspections daily. Offending nail border lateral border right great toe debrided and curretaged. Border cleansed with alcohol. TAO applied. Monitor blood glucose per PCP/Endocrinologist's recommendations. Continue soft, supportive shoe gear daily. Report any pedal injuries to medical professional. Call office if there are any questions/concerns.  Return in about 3 months (around 04/12/2024).  Penny Andrews, DPM      [place] LOCATION: [address] [place] [address]                   Office [phone]   [facility] LOCATION: [address] [place], [place] [address] Office [phone]

## 2024-02-20 ENCOUNTER — Other Ambulatory Visit: Payer: Self-pay | Admitting: Internal Medicine

## 2024-02-20 DIAGNOSIS — E118 Type 2 diabetes mellitus with unspecified complications: Secondary | ICD-10-CM

## 2024-02-20 NOTE — Telephone Encounter (Signed)
 Requested Prescriptions  Pending Prescriptions Disp Refills   metFORMIN  (GLUCOPHAGE -XR) 750 MG 24 hr tablet [Pharmacy Med Name: METFORMIN  ER 750MG  24HR TABS] 90 tablet 1    Sig: TAKE 1 TABLET(750 MG) BY MOUTH DAILY WITH BREAKFAST     Endocrinology:  Diabetes - Biguanides Failed - 02/20/2024  3:29 PM      Failed - B12 Level in normal range and within 720 days    No results found for: VITAMINB12       Passed - Cr in normal range and within 360 days    Creatinine, Ser  Date Value Ref Range Status  08/08/2023 0.81 0.57 - 1.00 mg/dL Final         Passed - HBA1C is between 0 and 7.9 and within 180 days    Hemoglobin A1C  Date Value Ref Range Status  12/29/2023 5.9 (A) 4.0 - 5.6 % Final   Hgb A1c MFr Bld  Date Value Ref Range Status  08/08/2023 6.4 (H) 4.8 - 5.6 % Final    Comment:             Prediabetes: 5.7 - 6.4          Diabetes: >6.4          Glycemic control for adults with diabetes: <7.0          Passed - eGFR in normal range and within 360 days    GFR calc Af Amer  Date Value Ref Range Status  04/30/2020 65 >59 mL/min/1.73 Final    Comment:    **In accordance with recommendations from the NKF-ASN Task force,**   [facility] is in the process of updating its eGFR calculation to the   2021 CKD-EPI creatinine equation that estimates kidney function   without a race variable.    GFR calc non Af Amer  Date Value Ref Range Status  04/30/2020 57 (L) >59 mL/min/1.73 Final   eGFR  Date Value Ref Range Status  08/08/2023 66 >59 mL/min/1.73 Final         Passed - Valid encounter within last 6 months    Recent Outpatient Visits           1 month ago Essential hypertension   [place] [facility], Leita DEL, MD   3 months ago Excessive cerumen in both ear canals   [place] [facility] at [facility], Leita DEL, MD   6 months ago Essential hypertension    [facility] at [facility], Leita DEL, MD              Passed - CBC within normal limits and completed in the last 12 months    WBC  Date Value Ref Range Status  08/08/2023 6.7 3.4 - 10.8 x10E3/uL Final   RBC  Date Value Ref Range Status  08/08/2023 4.76 3.77 - 5.28 x10E6/uL Final   Hemoglobin  Date Value Ref Range Status  08/08/2023 13.3 11.1 - 15.9 g/dL Final   Hematocrit  Date Value Ref Range Status  08/08/2023 41.4 34.0 - 46.6 % Final   MCHC  Date Value Ref Range Status  08/08/2023 32.1 31.5 - 35.7 g/dL Final   [facility]  Date Value Ref Range Status  08/08/2023 27.9 26.6 - 33.0 pg Final   MCV  Date Value Ref Range Status  08/08/2023 87 79 - 97 fL Final   No results found for: PLTCOUNTKUC, LABPLAT,  POCPLA RDW  Date Value Ref Range Status  08/08/2023 12.6 11.7 - 15.4 % Final

## 2024-04-02 ENCOUNTER — Telehealth: Payer: Self-pay | Admitting: Internal Medicine

## 2024-04-02 NOTE — Telephone Encounter (Signed)
 Tried reaching pt to reschedule this appt. For 11/14. Provider will be out of office.

## 2024-04-18 ENCOUNTER — Encounter: Payer: Self-pay | Admitting: Podiatry

## 2024-04-18 ENCOUNTER — Ambulatory Visit: Admitting: Podiatry

## 2024-04-18 DIAGNOSIS — M79674 Pain in right toe(s): Secondary | ICD-10-CM | POA: Diagnosis not present

## 2024-04-18 DIAGNOSIS — M79675 Pain in left toe(s): Secondary | ICD-10-CM | POA: Diagnosis not present

## 2024-04-18 DIAGNOSIS — B351 Tinea unguium: Secondary | ICD-10-CM

## 2024-04-18 DIAGNOSIS — E118 Type 2 diabetes mellitus with unspecified complications: Secondary | ICD-10-CM | POA: Diagnosis not present

## 2024-04-21 NOTE — Progress Notes (Signed)
  Subjective:  Patient ID: Penny Andrews, female    DOB: [birth year],  MRN: [id number]  Penny Andrews presents to clinic today for preventative diabetic foot care for painful, discolored, thick toenails which interfere with daily activities She is accompanied by her granddaughter on today's visit. Chief Complaint  Patient presents with   Toe Pain    Dr. Justus is her PCP. Last visit was in July. A1c 6.4.    New problem(s): None.   PCP is Justus Leita DEL, MD.  Allergies  Allergen Reactions   Penicillins Swelling    Review of Systems: Negative except as noted in the HPI.  Objective:  There were no vitals filed for this visit. Penny Andrews is a pleasant 89 y.o. female WD, WN in NAD. AAO x 3.  Vascular Examination: Capillary refill time immediate b/l. Vascular status intact b/l with palpable DP pulses; faintly palpable PT pulses. Pedal hair decreased b/l. No edema. No pain with calf compression b/l. Skin temperature gradient WNL b/l. No cyanosis or clubbing noted b/l. No ischemia or gangrene b/l.   Neurological Examination: Sensation grossly intact b/l with 10 gram monofilament. Vibratory sensation intact b/l.   Dermatological Examination: Pedal skin with normal turgor, texture and tone b/l.  No open wounds. No interdigital macerations.   Toenails 1-5 b/l thick, discolored, elongated with subungual debris and pain on dorsal palpation.  No hyperkeratotic nor porokeratotic lesions.  Musculoskeletal Examination: Muscle strength 5/5 to all lower extremity muscle groups bilaterally. No pain, crepitus or joint limitation noted with ROM bilateral LE. No gross bony deformities bilaterally. Utilizes walker for ambulation assistance.  Radiographs: None Assessment/Plan: 1. Pain due to onychomycosis of toenails of both feet   2. Type II diabetes mellitus with complication [facility])   Consent given for treatment. Patient examined. All patient's and/or POA's questions/concerns addressed  on today's visit. Mycotic toenails 1-5 b/l debrided in length and girth without incident. Continue foot and shoe inspections daily. Monitor blood glucose per PCP/Endocrinologist's recommendations.Continue soft, supportive shoe gear daily. Report any pedal injuries to medical professional. Call office if there are any quesitons/concerns. -Patient/POA to call should there be question/concern in the interim.   Return in about 3 months (around 07/19/2024).  Delon LITTIE Merlin, DPM      [place] LOCATION: [address] [place] [address]                   Office [phone]   [facility] LOCATION: [address] [place], [place] [address] Office [phone]

## 2024-05-03 ENCOUNTER — Ambulatory Visit: Admitting: Internal Medicine

## 2024-05-08 DIAGNOSIS — R002 Palpitations: Secondary | ICD-10-CM | POA: Diagnosis not present

## 2024-05-08 DIAGNOSIS — E782 Mixed hyperlipidemia: Secondary | ICD-10-CM | POA: Diagnosis not present

## 2024-05-08 DIAGNOSIS — I10 Essential (primary) hypertension: Secondary | ICD-10-CM | POA: Diagnosis not present

## 2024-05-08 DIAGNOSIS — E119 Type 2 diabetes mellitus without complications: Secondary | ICD-10-CM | POA: Diagnosis not present

## 2024-05-10 ENCOUNTER — Ambulatory Visit (INDEPENDENT_AMBULATORY_CARE_PROVIDER_SITE_OTHER): Admitting: Internal Medicine

## 2024-05-10 ENCOUNTER — Encounter: Payer: Self-pay | Admitting: Internal Medicine

## 2024-05-10 VITALS — BP 118/78 | HR 92 | Ht 64.0 in | Wt 145.0 lb

## 2024-05-10 DIAGNOSIS — E1169 Type 2 diabetes mellitus with other specified complication: Secondary | ICD-10-CM | POA: Diagnosis not present

## 2024-05-10 DIAGNOSIS — Z7984 Long term (current) use of oral hypoglycemic drugs: Secondary | ICD-10-CM

## 2024-05-10 DIAGNOSIS — E785 Hyperlipidemia, unspecified: Secondary | ICD-10-CM

## 2024-05-10 DIAGNOSIS — Z8711 Personal history of peptic ulcer disease: Secondary | ICD-10-CM

## 2024-05-10 DIAGNOSIS — I10 Essential (primary) hypertension: Secondary | ICD-10-CM

## 2024-05-10 DIAGNOSIS — E118 Type 2 diabetes mellitus with unspecified complications: Secondary | ICD-10-CM

## 2024-05-10 DIAGNOSIS — Z23 Encounter for immunization: Secondary | ICD-10-CM | POA: Diagnosis not present

## 2024-05-10 MED ORDER — PANTOPRAZOLE SODIUM 40 MG PO TBEC: 40.0000 mg | DELAYED_RELEASE_TABLET | Freq: Every day | ORAL | 3 refills | Status: AC

## 2024-05-10 MED ORDER — METFORMIN HCL ER 750 MG PO TB24
750.0000 mg | ORAL_TABLET | Freq: Every day | ORAL | 3 refills | Status: AC
Start: 2024-05-10 — End: ?

## 2024-05-10 MED ORDER — LISINOPRIL 5 MG PO TABS: ORAL_TABLET | ORAL | 3 refills | Status: AC

## 2024-05-10 MED ORDER — LOVASTATIN 40 MG PO TABS: ORAL_TABLET | ORAL | 3 refills | Status: AC

## 2024-05-10 NOTE — Assessment & Plan Note (Signed)
 Well controlled blood pressure today. Current regimen is lisinopril . No medication side effects noted.

## 2024-05-10 NOTE — Assessment & Plan Note (Addendum)
 Currently medications are MTF.  No hypoglycemic episodes noted. Home blood sugars in the 100 range.  The lowest reading was 81 in the past 4 months. The highest reading 142. Last visit medical regimen changes were none. Lab Results  Component Value Date   HGBA1C 5.9 (A) 12/29/2023  A1C today = 5.6.  Will continue the same medications.

## 2024-05-10 NOTE — Progress Notes (Signed)
 Date:  05/10/2024   Name:  Penny Andrews   DOB:  10/02/1924   MRN:  [id number]   Chief Complaint: Follow-up  Hypertension This is a chronic problem. The problem is controlled. Pertinent negatives include no chest pain, headaches, palpitations or shortness of breath. Past treatments include ACE inhibitors. The current treatment provides significant improvement.  Hyperlipidemia Pertinent negatives include no chest pain, myalgias or shortness of breath.  Diabetes She presents for her follow-up diabetic visit. She has type 2 diabetes mellitus. Her disease course has been stable. Pertinent negatives for hypoglycemia include no dizziness, headaches or nervousness/anxiousness. Pertinent negatives for diabetes include no chest pain, no fatigue and no weakness. Current diabetic treatment includes oral agent (monotherapy).    Review of Systems  Constitutional:  Negative for fatigue and unexpected weight change.  HENT:  Negative for trouble swallowing.   Eyes:  Negative for visual disturbance.  Respiratory:  Negative for cough, chest tightness, shortness of breath and wheezing.   Cardiovascular:  Negative for chest pain, palpitations and leg swelling.  Gastrointestinal:  Negative for abdominal pain, constipation and diarrhea.  Musculoskeletal:  Negative for arthralgias and myalgias.  Neurological:  Negative for dizziness, weakness, light-headedness and headaches.  Psychiatric/Behavioral:  Negative for dysphoric mood and sleep disturbance. The patient is not nervous/anxious.      Lab Results  Component Value Date   NA 139 08/08/2023   K 5.2 08/08/2023   CO2 26 08/08/2023   GLUCOSE 135 (H) 08/08/2023   BUN 18 08/08/2023   CREATININE 0.81 08/08/2023   CALCIUM 9.7 08/08/2023   EGFR 66 08/08/2023   GFRNONAA 57 (L) 04/30/2020   Lab Results  Component Value Date   CHOL 141 08/08/2023   HDL 45 08/08/2023   LDLCALC 75 08/08/2023   TRIG 118 08/08/2023   CHOLHDL 3.1 08/08/2023   Lab  Results  Component Value Date   TSH 1.240 08/08/2023   Lab Results  Component Value Date   HGBA1C 5.9 (A) 12/29/2023   Lab Results  Component Value Date   WBC 6.7 08/08/2023   HGB 13.3 08/08/2023   HCT 41.4 08/08/2023   MCV 87 08/08/2023   PLT 189 08/08/2023   Lab Results  Component Value Date   ALT 10 08/08/2023   AST 18 08/08/2023   ALKPHOS 109 08/08/2023   BILITOT 0.3 08/08/2023   Lab Results  Component Value Date   VD25OH 42.3 04/30/2020     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   Essential hypertension 10/31/2014   Hyperlipidemia associated with type 2 diabetes mellitus (HCC) 10/31/2014   History of peptic ulcer disease 10/31/2014   Has a tremor 10/31/2014   Avitaminosis D 10/31/2014   Type II diabetes mellitus with complication (HCC) 12/27/2013    Allergies  Allergen Reactions   Penicillins Swelling    Past Surgical History:  Procedure Laterality Date   ESOPHAGOGASTRODUODENOSCOPY  07/2014   grade D esophagitis; prepyloric ulcer   EYE SURGERY Bilateral    cataracts   TONSILLECTOMY     UMBILICAL HERNIA REPAIR      Social History   Tobacco Use   Smoking status: Never   Smokeless tobacco: Never   Tobacco comments:    smoking cessation materials not required  Vaping Use   Vaping status: Never Used  Substance Use Topics   Alcohol use:  Not Currently    Comment: occasionally once a year   Drug use: No     Medication list has been reviewed and updated.  Current Meds  Medication Sig   ACCU-CHEK SOFTCLIX LANCETS lancets    Acetylcysteine (NAC 600 PO) Take by mouth daily.   Apoaequorin (PREVAGEN PO) Take by mouth daily.   aspirin 81 MG tablet Take 81 mg by mouth every evening.   Blood Glucose Monitoring Suppl (ACCU-CHEK AVIVA PLUS) W/DEVICE KIT    cholecalciferol (VITAMIN D) 1000 UNITS tablet  Take 1,000 Units by mouth daily.   glucose blood test strip    magnesium 30 MG tablet Take 400 mg by mouth daily.   Multiple Vitamin (MULTIVITAMIN WITH MINERALS) TABS tablet Take 1 tablet by mouth daily.   vitamin C (ASCORBIC ACID) 500 MG tablet Take by mouth.   Zinc 10 MG LOZG Use as directed in the mouth or throat.   [DISCONTINUED] lisinopril  (ZESTRIL ) 5 MG tablet TAKE 1 TABLET(5 MG) BY MOUTH DAILY   [DISCONTINUED] lovastatin  (MEVACOR ) 40 MG tablet TAKE 1 TABLET(40 MG) BY MOUTH EVERY EVENING   [DISCONTINUED] metFORMIN  (GLUCOPHAGE -XR) 750 MG 24 hr tablet TAKE 1 TABLET(750 MG) BY MOUTH DAILY WITH BREAKFAST   [DISCONTINUED] pantoprazole  (PROTONIX ) 40 MG tablet Take 1 tablet (40 mg total) by mouth daily.       05/10/2024    3:05 PM 12/29/2023    2:32 PM 10/27/2023    2:08 PM 08/08/2023    3:11 PM  GAD 7 : Generalized Anxiety Score  Nervous, Anxious, on Edge 0 0 0 0  Control/stop worrying 0 0 0 0  Worry too much - different things 0 0  0  Trouble relaxing 0 0  0  Restless 0 0  0  Easily annoyed or irritable 0 0  0  Afraid - awful might happen 0 0  0  Total GAD 7 Score 0 0  0  Anxiety Difficulty Not difficult at all Not difficult at all  Not difficult at all       05/10/2024    3:04 PM 12/29/2023    2:32 PM 10/27/2023    2:08 PM  Depression screen PHQ 2/9  Decreased Interest 0 0 0  Down, Depressed, Hopeless 0 0 0  PHQ - 2 Score 0 0 0  Altered sleeping 0 0   Tired, decreased energy 0 0   Change in appetite 0 0   Feeling bad or failure about yourself  0 0   Trouble concentrating 0 0   Moving slowly or fidgety/restless 0 0   Suicidal thoughts 0 0   PHQ-9 Score 0 0    Difficult doing work/chores Not difficult at all Not difficult at all      Data saved with a previous flowsheet row definition    BP Readings from Last 3 Encounters:  05/10/24 118/78  12/29/23 102/68  10/27/23 130/74    Physical Exam Vitals and nursing note reviewed.  Constitutional:      General: She is  not in acute distress.    Appearance: She is well-developed.  HENT:     Head: Normocephalic and atraumatic.  Neck:     Vascular: No carotid bruit.  Cardiovascular:     Rate and Rhythm: Normal rate and regular rhythm.  Pulmonary:     Effort: Pulmonary effort is normal. No respiratory distress.     Breath sounds: No wheezing or rhonchi.  Musculoskeletal:     Cervical back: Normal range of motion.  Right lower leg: No edema.     Left lower leg: No edema.  Lymphadenopathy:     Cervical: No cervical adenopathy.  Skin:    General: Skin is warm and dry.     Capillary Refill: Capillary refill takes less than 2 seconds.     Findings: No rash.  Neurological:     General: No focal deficit present.     Mental Status: She is alert and oriented to person, place, and time.  Psychiatric:        Mood and Affect: Mood normal.        Behavior: Behavior normal.     Wt Readings from Last 3 Encounters:  05/10/24 145 lb (65.8 kg)  12/29/23 148 lb 9.6 oz (67.4 kg)  10/27/23 152 lb 4 oz (69.1 kg)    BP 118/78   Pulse 92   Ht 5' 4 (1.626 m)   Wt 145 lb (65.8 kg)   SpO2 99%   BMI 24.89 kg/m   Assessment and Plan:  Problem List Items Addressed This Visit       Unprioritized   Essential hypertension (Chronic)   Well controlled blood pressure today. Current regimen is lisinopril . No medication side effects noted.        Relevant Medications   lisinopril  (ZESTRIL ) 5 MG tablet   lovastatin  (MEVACOR ) 40 MG tablet   Hyperlipidemia associated with type 2 diabetes mellitus (HCC) (Chronic)   Lipids controlled. Lab Results  Component Value Date   LDLCALC 75 08/08/2023         Relevant Medications   lisinopril  (ZESTRIL ) 5 MG tablet   lovastatin  (MEVACOR ) 40 MG tablet   metFORMIN  (GLUCOPHAGE -XR) 750 MG 24 hr tablet   History of peptic ulcer disease   Relevant Medications   pantoprazole  (PROTONIX ) 40 MG tablet   Type II diabetes mellitus with complication (HCC) (Chronic)    Currently medications are MTF.  No hypoglycemic episodes noted. Home blood sugars in the 100 range.  The lowest reading was 81 in the past 4 months. The highest reading 142. Last visit medical regimen changes were none. Lab Results  Component Value Date   HGBA1C 5.9 (A) 12/29/2023  A1C today = 5.6.  Will continue the same medications.       Relevant Medications   lisinopril  (ZESTRIL ) 5 MG tablet   lovastatin  (MEVACOR ) 40 MG tablet   metFORMIN  (GLUCOPHAGE -XR) 750 MG 24 hr tablet   Other Visit Diagnoses       Long term current use of oral hypoglycemic drug    -  Primary     Encounter for immunization       Relevant Orders   Flu vaccine HIGH DOSE PF(Fluzone Trivalent) (Completed)       No follow-ups on file.    Leita HILARIO Adie, MD [facility] [place]

## 2024-05-10 NOTE — Assessment & Plan Note (Signed)
 Lipids controlled. Lab Results  Component Value Date   LDLCALC 75 08/08/2023

## 2024-05-17 ENCOUNTER — Other Ambulatory Visit: Payer: Self-pay | Admitting: Internal Medicine

## 2024-05-17 DIAGNOSIS — E1169 Type 2 diabetes mellitus with other specified complication: Secondary | ICD-10-CM

## 2024-05-20 DIAGNOSIS — R008 Other abnormalities of heart beat: Secondary | ICD-10-CM | POA: Diagnosis not present

## 2024-05-21 NOTE — Telephone Encounter (Signed)
 Requested Prescriptions  Refused Prescriptions Disp Refills   lovastatin  (MEVACOR ) 40 MG tablet [Pharmacy Med Name: LOVASTATIN  40MG  TABLETS] 90 tablet 3    Sig: TAKE 1 TABLET(40 MG) BY MOUTH EVERY EVENING     Cardiovascular:  Antilipid - Statins 2 Failed - 05/21/2024  1:43 PM      Failed - Lipid Panel in normal range within the last 12 months    Cholesterol, Total  Date Value Ref Range Status  08/08/2023 141 100 - 199 mg/dL Final   LDL Chol Calc (NIH)  Date Value Ref Range Status  08/08/2023 75 0 - 99 mg/dL Final   HDL  Date Value Ref Range Status  08/08/2023 45 >39 mg/dL Final   Triglycerides  Date Value Ref Range Status  08/08/2023 118 0 - 149 mg/dL Final         Passed - Cr in normal range and within 360 days    Creatinine, Ser  Date Value Ref Range Status  08/08/2023 0.81 0.57 - 1.00 mg/dL Final         Passed - Patient is not pregnant      Passed - Valid encounter within last 12 months    Recent Outpatient Visits           1 week ago Long term current use of oral hypoglycemic drug   [place] [facility], Leita DEL, MD   4 months ago Essential hypertension   [place] [facility] at [facility], Leita DEL, MD   6 months ago Excessive cerumen in both ear canals    [facility] at [facility], Leita DEL, MD   9 months ago Essential hypertension   [facility] at [facility], Leita DEL, MD

## 2024-06-04 DIAGNOSIS — E119 Type 2 diabetes mellitus without complications: Secondary | ICD-10-CM | POA: Diagnosis not present

## 2024-06-04 DIAGNOSIS — I10 Essential (primary) hypertension: Secondary | ICD-10-CM | POA: Diagnosis not present

## 2024-06-04 DIAGNOSIS — R002 Palpitations: Secondary | ICD-10-CM | POA: Diagnosis not present

## 2024-06-04 DIAGNOSIS — E782 Mixed hyperlipidemia: Secondary | ICD-10-CM | POA: Diagnosis not present

## 2024-06-11 ENCOUNTER — Ambulatory Visit (INDEPENDENT_AMBULATORY_CARE_PROVIDER_SITE_OTHER): Admitting: Internal Medicine

## 2024-06-11 ENCOUNTER — Encounter: Payer: Self-pay | Admitting: Internal Medicine

## 2024-06-11 VITALS — BP 146/84 | HR 70 | Ht 64.0 in | Wt 145.0 lb

## 2024-06-11 DIAGNOSIS — I10 Essential (primary) hypertension: Secondary | ICD-10-CM

## 2024-06-11 DIAGNOSIS — H9193 Unspecified hearing loss, bilateral: Secondary | ICD-10-CM | POA: Diagnosis not present

## 2024-06-11 NOTE — Assessment & Plan Note (Signed)
 BP well controlled for age. Will continue same regimen.

## 2024-06-11 NOTE — Progress Notes (Signed)
 "   Date:  06/11/2024   Name:  Penny Andrews   DOB:  Mar 31, 1925   MRN:  [id number]   Chief Complaint: Ear irrigation   Ear Fullness  There is pain in both ears. This is a recurrent problem. Associated symptoms include hearing loss. Pertinent negatives include no headaches.    Review of Systems  Constitutional:  Negative for chills, fatigue and fever.  HENT:  Positive for hearing loss.   Respiratory:  Negative for chest tightness and shortness of breath.   Cardiovascular:  Negative for chest pain.  Neurological:  Negative for dizziness and headaches.     Lab Results  Component Value Date   NA 139 08/08/2023   K 5.2 08/08/2023   CO2 26 08/08/2023   GLUCOSE 135 (H) 08/08/2023   BUN 18 08/08/2023   CREATININE 0.81 08/08/2023   CALCIUM 9.7 08/08/2023   EGFR 66 08/08/2023   GFRNONAA 57 (L) 04/30/2020   Lab Results  Component Value Date   CHOL 141 08/08/2023   HDL 45 08/08/2023   LDLCALC 75 08/08/2023   TRIG 118 08/08/2023   CHOLHDL 3.1 08/08/2023   Lab Results  Component Value Date   TSH 1.240 08/08/2023   Lab Results  Component Value Date   HGBA1C 5.9 (A) 12/29/2023   Lab Results  Component Value Date   WBC 6.7 08/08/2023   HGB 13.3 08/08/2023   HCT 41.4 08/08/2023   MCV 87 08/08/2023   PLT 189 08/08/2023   Lab Results  Component Value Date   ALT 10 08/08/2023   AST 18 08/08/2023   ALKPHOS 109 08/08/2023   BILITOT 0.3 08/08/2023   Lab Results  Component Value Date   VD25OH 42.3 04/30/2020     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  Active Medications[3]     05/10/2024    3:05 PM 12/29/2023    2:32 PM 10/27/2023    2:08 PM 08/08/2023    3:11 PM  GAD 7 : Generalized Anxiety Score  Nervous, Anxious, on Edge 0 0 0 0  Control/stop worrying 0 0 0 0  Worry too much - different things 0 0  0  Trouble relaxing 0 0  0  Restless 0 0  0  Easily annoyed or irritable 0 0  0  Afraid - awful might happen 0 0  0  Total GAD 7 Score 0 0  0  Anxiety Difficulty Not difficult at all Not difficult at all  Not difficult at all       05/10/2024    3:04 PM 12/29/2023    2:32 PM 10/27/2023    2:08 PM  Depression screen PHQ 2/9  Decreased Interest 0 0 0  Down, Depressed, Hopeless 0 0  0  PHQ - 2 Score 0 0 0  Altered sleeping 0 0   Tired, decreased energy 0 0   Change in appetite 0 0   Feeling bad or failure about yourself  0 0   Trouble concentrating 0 0   Moving slowly or fidgety/restless 0 0   Suicidal thoughts 0 0   PHQ-9 Score 0 0    Difficult doing work/chores Not difficult at all Not difficult at all      Data saved with a previous flowsheet row definition    BP Readings from Last 3 Encounters:  06/11/24 (!) 146/84  05/10/24 118/78  12/29/23 102/68    Physical Exam Vitals and nursing note reviewed.  Constitutional:      General: She is not in acute distress.    Appearance: Normal appearance. She is well-developed.  HENT:     Head: Normocephalic and atraumatic.     Right Ear: Decreased hearing noted.     Left Ear: Decreased hearing noted.     Ears:     Comments: Scant cerumen in right ear canal Left canal clear Cardiovascular:     Rate and Rhythm: Normal rate and regular rhythm.  Pulmonary:      Effort: Pulmonary effort is normal. No respiratory distress.     Breath sounds: No wheezing or rhonchi.  Musculoskeletal:     Cervical back: Normal range of motion.  Lymphadenopathy:     Cervical: No cervical adenopathy.  Skin:    General: Skin is warm and dry.     Findings: No rash.  Neurological:     Mental Status: She is alert and oriented to person, place, and time.  Psychiatric:        Mood and Affect: Mood normal.        Behavior: Behavior normal.     Wt Readings from Last 3 Encounters:  06/11/24 145 lb (65.8 kg)  05/10/24 145 lb (65.8 kg)  12/29/23 148 lb 9.6 oz (67.4 kg)    BP (!) 146/84   Pulse 70   Ht 5' 4 (1.626 m)   Wt 145 lb (65.8 kg)   SpO2 99%   BMI 24.89 kg/m   Assessment and Plan:  Problem List Items Addressed This Visit       Unprioritized   Essential hypertension - Primary (Chronic)   BP well controlled for age. Will continue same regimen.       Bilateral hearing loss   Doing well with hearing aids. No excessive cerumen on exam. Patient and family reassured.       No follow-ups on file.    Leita HILARIO Adie, MD [place] [facility] [place]           [1]  Allergies Allergen Reactions   Penicillins Swelling  [2]  Social History Tobacco Use   Smoking status: Never   Smokeless tobacco: Never   Tobacco comments:    smoking cessation materials not required  Vaping Use   Vaping status: Never Used  Substance Use Topics   Alcohol use: Not Currently    Comment: occasionally once a year   Drug use: No  [3]  Current Meds  Medication Sig   ACCU-CHEK SOFTCLIX LANCETS lancets    Acetylcysteine (NAC 600 PO) Take by mouth daily.   Apoaequorin (PREVAGEN PO) Take by mouth daily.   aspirin 81 MG tablet Take 81 mg by mouth every evening.   Blood Glucose Monitoring Suppl (ACCU-CHEK AVIVA PLUS) W/DEVICE KIT  cholecalciferol (VITAMIN D) 1000 UNITS tablet Take 1,000 Units by mouth daily.   glucose  blood test strip    lisinopril  (ZESTRIL ) 5 MG tablet TAKE 1 TABLET(5 MG) BY MOUTH DAILY   lovastatin  (MEVACOR ) 40 MG tablet TAKE 1 TABLET(40 MG) BY MOUTH EVERY EVENING   magnesium 30 MG tablet Take 400 mg by mouth daily.   metFORMIN  (GLUCOPHAGE -XR) 750 MG 24 hr tablet Take 1 tablet (750 mg total) by mouth daily with breakfast.   Multiple Vitamin (MULTIVITAMIN WITH MINERALS) TABS tablet Take 1 tablet by mouth daily.   pantoprazole  (PROTONIX ) 40 MG tablet Take 1 tablet (40 mg total) by mouth daily.   vitamin C (ASCORBIC ACID) 500 MG tablet Take by mouth.   Zinc 10 MG LOZG Use as directed in the mouth or throat.   "

## 2024-06-11 NOTE — Assessment & Plan Note (Signed)
 Doing well with hearing aids. No excessive cerumen on exam. Patient and family reassured.

## 2024-08-01 ENCOUNTER — Ambulatory Visit: Admitting: Podiatry

## 2024-09-09 ENCOUNTER — Encounter: Admitting: Family Medicine
